# Patient Record
Sex: Male | Born: 1945 | Race: White | Hispanic: No | Marital: Married | Smoking: Never smoker
Health system: Southern US, Community
[De-identification: ages and names within clinical notes are randomized; demographics above are authoritative.]

## PROBLEM LIST (undated history)

## (undated) DIAGNOSIS — C4491 Basal cell carcinoma of skin, unspecified: Secondary | ICD-10-CM

## (undated) DIAGNOSIS — N200 Calculus of kidney: Secondary | ICD-10-CM

## (undated) DIAGNOSIS — N400 Enlarged prostate without lower urinary tract symptoms: Secondary | ICD-10-CM

## (undated) DIAGNOSIS — I2510 Atherosclerotic heart disease of native coronary artery without angina pectoris: Secondary | ICD-10-CM

## (undated) DIAGNOSIS — L0390 Cellulitis, unspecified: Secondary | ICD-10-CM

## (undated) DIAGNOSIS — Z9889 Other specified postprocedural states: Secondary | ICD-10-CM

## (undated) DIAGNOSIS — J329 Chronic sinusitis, unspecified: Secondary | ICD-10-CM

## (undated) DIAGNOSIS — C61 Malignant neoplasm of prostate: Secondary | ICD-10-CM

## (undated) DIAGNOSIS — E785 Hyperlipidemia, unspecified: Secondary | ICD-10-CM

## (undated) DIAGNOSIS — M25511 Pain in right shoulder: Secondary | ICD-10-CM

## (undated) DIAGNOSIS — M722 Plantar fascial fibromatosis: Secondary | ICD-10-CM

## (undated) DIAGNOSIS — D099 Carcinoma in situ, unspecified: Secondary | ICD-10-CM

## (undated) DIAGNOSIS — L309 Dermatitis, unspecified: Secondary | ICD-10-CM

## (undated) DIAGNOSIS — M4800 Spinal stenosis, site unspecified: Secondary | ICD-10-CM

## (undated) HISTORY — DX: Plantar fascial fibromatosis: M72.2

## (undated) HISTORY — PX: MRI: SHX5353

## (undated) HISTORY — DX: Basal cell carcinoma of skin, unspecified: C44.91

## (undated) HISTORY — DX: Carcinoma in situ, unspecified: D09.9

## (undated) HISTORY — DX: Other specified postprocedural states: Z98.890

## (undated) HISTORY — DX: Calculus of kidney: N20.0

## (undated) HISTORY — DX: Chronic sinusitis, unspecified: J32.9

## (undated) HISTORY — DX: Atherosclerotic heart disease of native coronary artery without angina pectoris: I25.10

## (undated) HISTORY — DX: Dermatitis, unspecified: L30.9

## (undated) HISTORY — DX: Cellulitis, unspecified: L03.90

## (undated) HISTORY — PX: LITHOTRIPSY: SUR834

## (undated) HISTORY — PX: KNEE SURGERY: SHX244

## (undated) HISTORY — DX: Malignant neoplasm of prostate: C61

## (undated) HISTORY — DX: Spinal stenosis, site unspecified: M48.00

## (undated) HISTORY — DX: Pain in right shoulder: M25.511

## (undated) HISTORY — DX: Hyperlipidemia, unspecified: E78.5

## (undated) HISTORY — PX: TONSILLECTOMY: SUR1361

## (undated) HISTORY — DX: Benign prostatic hyperplasia without lower urinary tract symptoms: N40.0

---

## 1968-06-14 HISTORY — PX: OTHER SURGICAL HISTORY: SHX169

## 1994-06-14 HISTORY — PX: COLONOSCOPY: SHX174

## 2009-02-21 ENCOUNTER — Ambulatory Visit: Payer: Self-pay | Admitting: Gastroenterology

## 2009-03-03 ENCOUNTER — Ambulatory Visit: Payer: Self-pay | Admitting: Gastroenterology

## 2009-09-11 ENCOUNTER — Encounter (INDEPENDENT_AMBULATORY_CARE_PROVIDER_SITE_OTHER): Payer: Self-pay | Admitting: Internal Medicine

## 2009-09-11 ENCOUNTER — Ambulatory Visit: Payer: Self-pay | Admitting: Surgery

## 2009-09-11 ENCOUNTER — Inpatient Hospital Stay (HOSPITAL_COMMUNITY): Admission: EM | Admit: 2009-09-11 | Discharge: 2009-09-11 | Payer: Self-pay | Admitting: Emergency Medicine

## 2010-02-20 ENCOUNTER — Ambulatory Visit (HOSPITAL_COMMUNITY): Admission: RE | Admit: 2010-02-20 | Discharge: 2010-02-20 | Payer: Self-pay | Admitting: Urology

## 2010-03-03 ENCOUNTER — Ambulatory Visit: Admission: RE | Admit: 2010-03-03 | Discharge: 2010-03-09 | Payer: Self-pay | Admitting: Radiation Oncology

## 2010-04-05 ENCOUNTER — Emergency Department (HOSPITAL_COMMUNITY): Admission: EM | Admit: 2010-04-05 | Discharge: 2010-04-05 | Payer: Self-pay | Admitting: Emergency Medicine

## 2010-06-14 DIAGNOSIS — C61 Malignant neoplasm of prostate: Secondary | ICD-10-CM

## 2010-06-14 HISTORY — DX: Malignant neoplasm of prostate: C61

## 2010-07-28 ENCOUNTER — Ambulatory Visit: Payer: Self-pay | Admitting: Radiation Oncology

## 2010-07-30 ENCOUNTER — Ambulatory Visit: Payer: Medicare Other | Attending: Radiation Oncology | Admitting: Radiation Oncology

## 2010-07-30 DIAGNOSIS — Z510 Encounter for antineoplastic radiation therapy: Secondary | ICD-10-CM | POA: Insufficient documentation

## 2010-07-30 DIAGNOSIS — C61 Malignant neoplasm of prostate: Secondary | ICD-10-CM | POA: Insufficient documentation

## 2010-08-06 ENCOUNTER — Other Ambulatory Visit (HOSPITAL_COMMUNITY): Payer: Self-pay | Admitting: Urology

## 2010-08-06 DIAGNOSIS — C61 Malignant neoplasm of prostate: Secondary | ICD-10-CM

## 2010-08-13 ENCOUNTER — Ambulatory Visit (HOSPITAL_COMMUNITY)
Admission: RE | Admit: 2010-08-13 | Discharge: 2010-08-13 | Disposition: A | Discharge status: Home / Self Care | Payer: Medicare Other | Source: Ambulatory Visit | Attending: Urology | Admitting: Urology

## 2010-08-13 DIAGNOSIS — C61 Malignant neoplasm of prostate: Secondary | ICD-10-CM | POA: Insufficient documentation

## 2010-08-13 MED ORDER — GADOBENATE DIMEGLUMINE 529 MG/ML IV SOLN
20.0000 mL | Freq: Once | INTRAVENOUS | Status: AC | PRN
  Administered 2010-08-13: 20 mL via INTRAVENOUS

## 2010-08-26 LAB — DIFFERENTIAL
Basophils Absolute: 0 10*3/uL (ref 0.0–0.1)
Lymphs Abs: 1.3 10*3/uL (ref 0.7–4.0)
Monocytes Relative: 6 % (ref 3–12)
Neutrophils Relative %: 76 % (ref 43–77)

## 2010-08-26 LAB — COMPREHENSIVE METABOLIC PANEL
ALT: 22 U/L (ref 0–53)
AST: 20 U/L (ref 0–37)
Albumin: 3.9 g/dL (ref 3.5–5.2)
Alkaline Phosphatase: 103 U/L (ref 39–117)
BUN: 15 mg/dL (ref 6–23)
GFR calc non Af Amer: >60 mL/min (ref >60)
Glucose, Bld: 120 mg/dL — ABNORMAL HIGH (ref 70–99)
Total Bilirubin: 0.5 mg/dL (ref 0.3–1.2)
Total Protein: 6.8 g/dL (ref 6.0–8.3)

## 2010-08-26 LAB — CBC
Hemoglobin: 14.4 g/dL (ref 13.0–17.0)
MCHC: 34.2 g/dL (ref 30.0–36.0)
Platelets: 256 10*3/uL (ref 150–400)
RDW: 14.5 % (ref 11.5–15.5)

## 2010-08-26 LAB — URINALYSIS, ROUTINE W REFLEX MICROSCOPIC
Bilirubin Urine: NEGATIVE
Nitrite: NEGATIVE
Specific Gravity, Urine: 1.022 (ref 1.005–1.030)
Urobilinogen, UA: 0.2 mg/dL (ref 0.0–1.0)

## 2010-09-07 LAB — CBC
HCT: 44.5 % (ref 39.0–52.0)
Hemoglobin: 15.1 g/dL (ref 13.0–17.0)
MCV: 87.2 fL (ref 78.0–100.0)
Platelets: 230 10*3/uL (ref 150–400)
RBC: 5.07 MIL/uL (ref 4.22–5.81)
RBC: 5.16 MIL/uL (ref 4.22–5.81)
RDW: 12.6 % (ref 11.5–15.5)
RDW: 12.9 % (ref 11.5–15.5)

## 2010-09-07 LAB — LIPID PANEL
Cholesterol: 235 mg/dL — ABNORMAL HIGH (ref 0–200)
HDL: 35 mg/dL — ABNORMAL LOW (ref >39)
LDL Cholesterol: 138 mg/dL — ABNORMAL HIGH (ref 0–99)
Triglycerides: 312 mg/dL — ABNORMAL HIGH (ref <150)

## 2010-09-07 LAB — COMPREHENSIVE METABOLIC PANEL
AST: 22 U/L (ref 0–37)
Albumin: 4.1 g/dL (ref 3.5–5.2)
BUN: 10 mg/dL (ref 6–23)
Calcium: 9 mg/dL (ref 8.4–10.5)
Chloride: 103 mEq/L (ref 96–112)
Creatinine, Ser: 0.99 mg/dL (ref 0.4–1.5)
GFR calc Af Amer: >60 mL/min (ref >60)
Total Protein: 6.9 g/dL (ref 6.0–8.3)

## 2010-09-07 LAB — URINALYSIS, ROUTINE W REFLEX MICROSCOPIC
Glucose, UA: NEGATIVE mg/dL
Hgb urine dipstick: NEGATIVE
Nitrite: NEGATIVE
Protein, ur: NEGATIVE mg/dL
Specific Gravity, Urine: 1.006 (ref 1.005–1.030)
pH: 7 (ref 5.0–8.0)

## 2010-09-07 LAB — POCT I-STAT, CHEM 8
Calcium, Ion: 1.17 mmol/L (ref 1.12–1.32)
Creatinine, Ser: 1 mg/dL (ref 0.4–1.5)
HCT: 49 % (ref 39.0–52.0)
Hemoglobin: 16.7 g/dL (ref 13.0–17.0)
Sodium: 140 mEq/L (ref 135–145)

## 2010-09-07 LAB — CARDIAC PANEL(CRET KIN+CKTOT+MB+TROPI)
CK, MB: 2.7 ng/mL (ref 0.3–4.0)
CK, MB: 2.9 ng/mL (ref 0.3–4.0)
Relative Index: 1.8 (ref 0.0–2.5)
Total CK: 160 U/L (ref 7–232)
Troponin I: <0.01 ng/mL (ref 0.00–0.06)

## 2010-09-07 LAB — POCT CARDIAC MARKERS: CKMB, poc: 1.8 ng/mL (ref 1.0–8.0)

## 2010-09-07 LAB — PROTIME-INR: Prothrombin Time: 12.6 seconds (ref 11.6–15.2)

## 2010-09-07 LAB — TSH: TSH: 6.132 u[IU]/mL — ABNORMAL HIGH (ref 0.350–4.500)

## 2010-09-07 LAB — CK TOTAL AND CKMB (NOT AT ARMC): Relative Index: 1.9 (ref 0.0–2.5)

## 2010-09-07 LAB — TROPONIN I: Troponin I: 0.01 ng/mL (ref 0.00–0.06)

## 2010-09-07 LAB — HEMOGLOBIN A1C: Hgb A1c MFr Bld: 6.4 % — ABNORMAL HIGH (ref 4.6–6.1)

## 2010-10-23 ENCOUNTER — Ambulatory Visit (HOSPITAL_BASED_OUTPATIENT_CLINIC_OR_DEPARTMENT_OTHER)
Admission: RE | Admit: 2010-10-23 | Discharge: 2010-10-23 | Disposition: A | Discharge status: Home / Self Care | Payer: Medicare Other | Source: Ambulatory Visit | Attending: Orthopedic Surgery | Admitting: Orthopedic Surgery

## 2010-10-23 DIAGNOSIS — M23329 Other meniscus derangements, posterior horn of medial meniscus, unspecified knee: Secondary | ICD-10-CM | POA: Insufficient documentation

## 2010-10-23 DIAGNOSIS — M9420 Chondromalacia, unspecified site: Secondary | ICD-10-CM | POA: Insufficient documentation

## 2010-10-26 LAB — POCT I-STAT 4, (NA,K, GLUC, HGB,HCT)
Hemoglobin: 14.6 g/dL (ref 13.0–17.0)
Potassium: 3.8 mEq/L (ref 3.5–5.1)
Sodium: 140 mEq/L (ref 135–145)

## 2010-10-28 ENCOUNTER — Ambulatory Visit
Admission: RE | Admit: 2010-10-28 | Discharge: 2010-10-28 | Disposition: A | Discharge status: Home / Self Care | Payer: Medicare Other | Source: Ambulatory Visit | Attending: Radiation Oncology | Admitting: Radiation Oncology

## 2010-10-28 DIAGNOSIS — C61 Malignant neoplasm of prostate: Secondary | ICD-10-CM | POA: Insufficient documentation

## 2010-10-28 DIAGNOSIS — Z510 Encounter for antineoplastic radiation therapy: Secondary | ICD-10-CM | POA: Insufficient documentation

## 2010-10-28 NOTE — Op Note (Signed)
  [name], Joseph Marsh           ACCOUNT NO.:  [id number]  MEDICAL RECORD NO.:  [id number]           PATIENT TYPE:  O  LOCATION:  [facility]                         FACILITY:  [facility]  PHYSICIAN:  Ollen Gross, M.D.    DATE OF BIRTH:  24-Jan-1946  DATE OF PROCEDURE:  10/23/2010 DATE OF DISCHARGE:  08/13/2010                              OPERATIVE REPORT   PREOPERATIVE DIAGNOSIS:  Left knee medial meniscal tear.  POSTOPERATIVE DIAGNOSIS:  Left knee medial meniscal tear, plus chondral defect.  PROCEDURE:  Left knee arthroscopy with meniscal debridement and chondroplasty.  SURGEON:  Ollen Gross, M.D.  ASSISTANT.:  None.  ANESTHESIA:  General.  ESTIMATED BLOOD LOSS:  Minimal.  DRAINS:  None.  COMPLICATIONS:  None.  CONDITION:  Stable to recovery room.  BRIEF CLINICAL NOTE:  [name] is a 65 year old male with several- month history significant left knee pain mechanical symptoms.  Exam and history suggested a medial meniscal tear confirmed by MRI.  He presents for arthroscopy and debridement.  PROCEDURE IN DETAIL:  After successful administration of general anesthetic, a tourniquet was placed high on the left thigh and left lower extremity prepped and draped in the usual sterile fashion. Standard superomedial inferolateral portal incisions were made.  Inflow cannula passed, superomedial camera passed inferolateral.  Arthroscopic visualization proceeds.  Undersurface of patella and trochlea showed some grade 2 changes.  There were no full-thickness chondral defects or any unstable cartilage.  Medial and lateral gutters were visualized, there were no loose bodies.  Flexion valgus force applied to the knee and the medial compartment centered.  He has got a real significant tear in the body and posterior horn of the medial meniscus.  A spinal needle was used to localize inferomedial portal.  Small incision made and dilator placed.  The meniscus was then debrided back to  stable base with baskets and a 4.2-mm shaver and sealed off with the ArthroCare device. This was probed and found to be stable.  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  There was some grade 3 change at about 1 x 2 cm area.  She was debrided back to stable cartilaginous base with stable edges.  There was not any exposed bone.  The rest of the medial compartment is unaffected.  The intercondylar notch was visualized.  ACL was normal.  Lateral compartment was entered and it looks normal.  The joint was again inspected.  No other tears, defects or loose bodies were noted.  Arthroscopic equipments were removed from the inferior portals which were closed with interrupted 4-0 nylon.  A 20 cc of 0.25% Marcaine with epi injected through the inflow cannula and that was removed and that portal closed with nylon.  Incision was then cleaned and dried, and bulky sterile dressing applied.  He was then awakened and transported to recovery in stable condition.     Ollen Gross, M.D.     FA/MEDQ  D:  10/23/2010  T:  10/23/2010  Job:  [id number]  Electronically Signed by Ollen Gross M.D. on 10/28/2010 10:12:44 AM

## 2010-11-03 ENCOUNTER — Ambulatory Visit (HOSPITAL_COMMUNITY)
Admission: RE | Admit: 2010-11-03 | Discharge: 2010-11-03 | Disposition: A | Discharge status: Home / Self Care | Payer: Medicare Other | Source: Ambulatory Visit | Attending: Orthopedic Surgery | Admitting: Orthopedic Surgery

## 2010-11-03 DIAGNOSIS — M79609 Pain in unspecified limb: Secondary | ICD-10-CM | POA: Insufficient documentation

## 2010-11-03 DIAGNOSIS — M7989 Other specified soft tissue disorders: Secondary | ICD-10-CM | POA: Insufficient documentation

## 2010-11-10 ENCOUNTER — Other Ambulatory Visit: Payer: Self-pay | Admitting: Urology

## 2010-11-10 ENCOUNTER — Ambulatory Visit
Admission: RE | Admit: 2010-11-10 | Discharge: 2010-11-10 | Disposition: A | Discharge status: Home / Self Care | Payer: Medicare Other | Source: Ambulatory Visit | Attending: Urology | Admitting: Urology

## 2010-11-10 DIAGNOSIS — Z01811 Encounter for preprocedural respiratory examination: Secondary | ICD-10-CM

## 2010-11-10 DIAGNOSIS — C61 Malignant neoplasm of prostate: Secondary | ICD-10-CM

## 2010-11-11 LAB — COMPREHENSIVE METABOLIC PANEL
ALT: 34 U/L (ref 0–53)
Alkaline Phosphatase: 141 U/L — ABNORMAL HIGH (ref 39–117)
BUN: 18 mg/dL (ref 6–23)
CO2: 29 mEq/L (ref 19–32)
Calcium: 9.9 mg/dL (ref 8.4–10.5)
GFR calc non Af Amer: >60 mL/min (ref >60)
Glucose, Bld: 94 mg/dL (ref 70–99)
Sodium: 141 mEq/L (ref 135–145)
Total Protein: 7.3 g/dL (ref 6.0–8.3)

## 2010-11-11 LAB — CBC
HCT: 42 % (ref 39.0–52.0)
Hemoglobin: 13.5 g/dL (ref 13.0–17.0)
MCHC: 32.1 g/dL (ref 30.0–36.0)
MCV: 85.2 fL (ref 78.0–100.0)
RDW: 12.8 % (ref 11.5–15.5)

## 2010-11-11 LAB — PROTIME-INR
INR: 0.95 (ref 0.00–1.49)
Prothrombin Time: 12.9 seconds (ref 11.6–15.2)

## 2010-11-16 ENCOUNTER — Ambulatory Visit (HOSPITAL_BASED_OUTPATIENT_CLINIC_OR_DEPARTMENT_OTHER)
Admission: RE | Admit: 2010-11-16 | Discharge: 2010-11-16 | Disposition: A | Discharge status: Home / Self Care | Payer: Medicare Other | Source: Ambulatory Visit | Attending: Urology | Admitting: Urology

## 2010-11-16 ENCOUNTER — Ambulatory Visit (HOSPITAL_COMMUNITY): Payer: Medicare Other

## 2010-11-16 DIAGNOSIS — Z8673 Personal history of transient ischemic attack (TIA), and cerebral infarction without residual deficits: Secondary | ICD-10-CM | POA: Insufficient documentation

## 2010-11-16 DIAGNOSIS — Z79899 Other long term (current) drug therapy: Secondary | ICD-10-CM | POA: Insufficient documentation

## 2010-11-16 DIAGNOSIS — Z7982 Long term (current) use of aspirin: Secondary | ICD-10-CM | POA: Insufficient documentation

## 2010-11-16 DIAGNOSIS — Z96659 Presence of unspecified artificial knee joint: Secondary | ICD-10-CM | POA: Insufficient documentation

## 2010-11-16 DIAGNOSIS — C61 Malignant neoplasm of prostate: Secondary | ICD-10-CM | POA: Insufficient documentation

## 2010-11-16 DIAGNOSIS — E119 Type 2 diabetes mellitus without complications: Secondary | ICD-10-CM | POA: Insufficient documentation

## 2010-11-16 DIAGNOSIS — Z01812 Encounter for preprocedural laboratory examination: Secondary | ICD-10-CM | POA: Insufficient documentation

## 2011-02-07 ENCOUNTER — Emergency Department (HOSPITAL_COMMUNITY): Payer: Medicare Other

## 2011-02-07 ENCOUNTER — Emergency Department (HOSPITAL_COMMUNITY)
Admission: EM | Admit: 2011-02-07 | Discharge: 2011-02-07 | Disposition: A | Discharge status: Home / Self Care | Payer: Medicare Other | Attending: Emergency Medicine | Admitting: Emergency Medicine

## 2011-02-07 DIAGNOSIS — L02519 Cutaneous abscess of unspecified hand: Secondary | ICD-10-CM | POA: Insufficient documentation

## 2011-02-07 DIAGNOSIS — Y93H2 Activity, gardening and landscaping: Secondary | ICD-10-CM | POA: Insufficient documentation

## 2011-02-07 DIAGNOSIS — Z8546 Personal history of malignant neoplasm of prostate: Secondary | ICD-10-CM | POA: Insufficient documentation

## 2011-02-07 DIAGNOSIS — W208XXA Other cause of strike by thrown, projected or falling object, initial encounter: Secondary | ICD-10-CM | POA: Insufficient documentation

## 2011-02-07 DIAGNOSIS — IMO0002 Reserved for concepts with insufficient information to code with codable children: Secondary | ICD-10-CM | POA: Insufficient documentation

## 2011-06-14 ENCOUNTER — Encounter: Payer: Self-pay | Admitting: *Deleted

## 2011-06-14 NOTE — Progress Notes (Signed)
09/03/2010 I-PSS Results=[id number] and 2 Score=2.5

## 2011-11-19 DIAGNOSIS — C61 Malignant neoplasm of prostate: Secondary | ICD-10-CM | POA: Diagnosis not present

## 2011-11-26 DIAGNOSIS — C61 Malignant neoplasm of prostate: Secondary | ICD-10-CM | POA: Diagnosis not present

## 2011-11-26 DIAGNOSIS — N529 Male erectile dysfunction, unspecified: Secondary | ICD-10-CM | POA: Diagnosis not present

## 2011-11-26 DIAGNOSIS — N400 Enlarged prostate without lower urinary tract symptoms: Secondary | ICD-10-CM | POA: Diagnosis not present

## 2012-03-28 ENCOUNTER — Encounter: Payer: Self-pay | Admitting: *Deleted

## 2012-03-29 ENCOUNTER — Encounter: Payer: Medicare Other | Admitting: *Deleted

## 2012-03-30 ENCOUNTER — Other Ambulatory Visit: Payer: Self-pay | Admitting: *Deleted

## 2012-04-05 ENCOUNTER — Encounter: Payer: Medicare Other | Admitting: *Deleted

## 2012-04-05 ENCOUNTER — Encounter: Payer: Self-pay | Admitting: *Deleted

## 2012-04-05 DIAGNOSIS — C61 Malignant neoplasm of prostate: Secondary | ICD-10-CM

## 2012-04-05 MED ORDER — ARGINMAX/PLACEBO #168 CCCWFU 98110: ORAL_CAPSULE | ORAL | Status: AC

## 2012-04-05 NOTE — Progress Notes (Signed)
04/05/12 at 9:43am - Research [facility] [address] Day 1 study notes- The pt was into the cancer center this am to pick up his study drug and his study packets for week 4 and week 8 time points.  The research nurse reminded the pt that he should not take any new herbal supplements for ED or any investigational products while participating in this 8 week study.  The nurse reviewed the pt's current medications, and there has been no change in his medications since baseline.  The research nurse went over in detail about the study drug administration.  The pt was shown that there were 2 bottles (Bottle A and Bottle B) for weeks 1-4 and 2 bottles (Bottle A and Bottle B) for weeks 5-8.  The pt was instructed to take 3 capsules from Bottle A and 3 capsules from Bottle B in the morning for a total of 6 capsules in the am.  He was shown how to record his am dose on the medication calendar.  He was further told to repeat this process for his evening dose and record the 6 capsules in the pm slot on the medication calendar.  He verbalized that he is take 12 capsules daily.  The pt then demonstrated his study drug administration by taking his am capsules in the clinic and recording his am medication on the calendar.  The nurse then explained the Sexual Encounter Profile forms (SEP).  The research nurse instructed the pt to attempt sexual activity weekly and record his response on the forms.  The pt was given extra SEP forms to complete after every sexual attempt while on the 8 week study.  The pt was told that the research nurse will call the pt to remind him to complete his week 4 and 8 questionnaires.  The pt was given a self-addressed envelope to return his completed week 4 assessments.  The pt was also given a UPS envelope, and he was told to return all of his study drug bottles (4 Bottles) and his 8 weeks assessments at the end of the study.  The nurse specifically explained that all bottles even empty bottles need to be returned  to our site for the final drug accountability check.  The pt was told that there is an emergency unblinding procedure for study drug if there was a need to determine which drug he was assigned to at registration.  The pt was told that he will be informed of his study drug assignment at the study conclusion if he completes his required assessments and returns all of his study drug bottles.  Rn also informed the pt to contact Dr. Rennie Plowman nurse or myself if he develops any problems while on-study.  The pt was thanked for his support and participation in this study.

## 2012-04-10 ENCOUNTER — Telehealth: Payer: Self-pay | Admitting: Gastroenterology

## 2012-04-10 ENCOUNTER — Encounter: Payer: Self-pay | Admitting: *Deleted

## 2012-04-10 NOTE — Telephone Encounter (Signed)
Pt with normal COLON by Dr Jarold Motto on 03/03/2009 and RECALL in 10 years. Pt reports rectal bleeding on and off for the past few weeks. Saturday, the bleeding was worse; all bleeding is BR and on the tissue only, not in the bowl. Pt has a hx of Prostate Cancer with radiation between 2/12 to 6/12 and seed implants in July 2012. Pt given an appt with Dr Jarold Motto on 04/12/12.

## 2012-04-12 ENCOUNTER — Other Ambulatory Visit (INDEPENDENT_AMBULATORY_CARE_PROVIDER_SITE_OTHER): Payer: Medicare Other

## 2012-04-12 ENCOUNTER — Ambulatory Visit (INDEPENDENT_AMBULATORY_CARE_PROVIDER_SITE_OTHER): Payer: Medicare Other | Admitting: Gastroenterology

## 2012-04-12 ENCOUNTER — Encounter: Payer: Self-pay | Admitting: Gastroenterology

## 2012-04-12 VITALS — BP 118/70 | HR 71 | Ht 70.0 in | Wt 175.4 lb

## 2012-04-12 DIAGNOSIS — R52 Pain, unspecified: Secondary | ICD-10-CM | POA: Diagnosis not present

## 2012-04-12 DIAGNOSIS — Z8546 Personal history of malignant neoplasm of prostate: Secondary | ICD-10-CM | POA: Diagnosis not present

## 2012-04-12 DIAGNOSIS — K648 Other hemorrhoids: Secondary | ICD-10-CM | POA: Diagnosis not present

## 2012-04-12 DIAGNOSIS — R6889 Other general symptoms and signs: Secondary | ICD-10-CM | POA: Diagnosis not present

## 2012-04-12 DIAGNOSIS — K625 Hemorrhage of anus and rectum: Secondary | ICD-10-CM | POA: Diagnosis not present

## 2012-04-12 DIAGNOSIS — Z5189 Encounter for other specified aftercare: Secondary | ICD-10-CM

## 2012-04-12 DIAGNOSIS — Z923 Personal history of irradiation: Secondary | ICD-10-CM

## 2012-04-12 LAB — CBC WITH DIFFERENTIAL/PLATELET
Basophils Relative: 0.6 % (ref 0.0–3.0)
Eosinophils Relative: 1.4 % (ref 0.0–5.0)
HCT: 44.5 % (ref 39.0–52.0)
Hemoglobin: 14.7 g/dL (ref 13.0–17.0)
Lymphocytes Relative: 23.5 % (ref 12.0–46.0)
Lymphs Abs: 1 10*3/uL (ref 0.7–4.0)
Monocytes Relative: 7.2 % (ref 3.0–12.0)
Neutro Abs: 2.8 10*3/uL (ref 1.4–7.7)
RBC: 5.07 Mil/uL (ref 4.22–5.81)

## 2012-04-12 LAB — IBC PANEL: Iron: 88 ug/dL (ref 42–165)

## 2012-04-12 LAB — FOLATE: Folate: >24.8 ng/mL (ref >5.9)

## 2012-04-12 LAB — VITAMIN B12: Vitamin B-12: 419 pg/mL (ref 211–911)

## 2012-04-12 MED ORDER — PEG-KCL-NACL-NASULF-NA ASC-C 100 G PO SOLR: 1.0000 | Freq: Once | ORAL | Status: DC

## 2012-04-12 MED ORDER — MESALAMINE 1000 MG RE SUPP: 1000.0000 mg | Freq: Every day | RECTAL | Status: DC

## 2012-04-12 NOTE — Patient Instructions (Addendum)
Your physician has requested that you go to the basement for the following lab work before leaving today: CBC, Anemia panel.  You have been scheduled for a colonoscopy with propofol. Please follow written instructions given to you at your visit today.  Please pick up your prep kit at the pharmacy within the next 1-3 days. If you use inhalers (even only as needed) or a CPAP machine, please bring them with you on the day of your procedure.  We have sent the following medications to your pharmacy for you to pick up at your convenience: Canasa suppositories and samples have been given for to use rectally at bedtime.

## 2012-04-12 NOTE — Progress Notes (Signed)
History of Present Illness:  This is a very pleasant 66 year old Caucasian male who has had asymptomatic rectal bleeding for the last week. The patient over the last one half years has had external beam radiation and also seed implantation for prostate cancer directed by Dr. Dayton Scrape. He had no problems during his radiation therapy with diarrhea, rectal bleeding or rectal pain. His asymptomatic rectal bleeding has only occurred over the last one to 2 weeks, he has not had blood clots, but does have some more frequent soft stools than usual. He also has a sensation of incomplete rectal emptying, but no real abdominal pain, upper GI, or hepatobiliary problems. His appetite is good and his weight is stable. He is not on anti-inflammatories, but was on an aspirin tablet that he discontinued. He also is recently started a clinical trial of a dietary supplement for erectile dysfunction.  The patient did have screening colonoscopy 3 years ago which was unremarkable except for a long and redundant colon. Family history is noncontributory.  I have reviewed this patient's present history, medical and surgical past history, allergies and medications.     ROS: The remainder of the 10 point ROS is negative     Physical Exam: Healthy-appearing patient in no distress. Blood pressure 118/70, pulse 71 and regular, and weight 175 with BMI of 25.17. Oxygen saturation is 97%. General well developed well nourished patient in no acute distress, appearing their stated age Eyes PERRLA, no icterus, fundoscopic exam per opthamologist Skin no lesions noted Neck supple, no adenopathy, no thyroid enlargement, no tenderness Chest clear to percussion and auscultation Heart no significant murmurs, gallops or rubs noted Abdomen no hepatosplenomegaly masses or tenderness, BS normal.  Rectal inspection normal no fissures, or fistulae noted.  No masses or tenderness on digital exam. Stool guaiac negative. Extremities no acute joint  lesions, edema, phlebitis or evidence of cellulitis. Neurologic patient oriented x 3, cranial nerves intact, no focal neurologic deficits noted. Anoscopy: Anoscopic exam was completed without difficulty. He appears to have a posterior medial and a posterior lateral internal hemorrhoid which had stigmata of recent bleeding. I cannot appreciate any definite rectal telangiectasias, fissures or fistulae.  Psychological mental status normal and normal affect.  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 I have placed him on Canasa 1 g suppositories at bedtime pending colonoscopy exam. Also CBC and anemia profile ordered. He will continue to hold his aspirin until his colonoscopy has been completed. I have asked him to check with his clinical study physicians about his investigational  Arginmax/placebo clinical trial. Apparently this medication does have some bleeding side effects. This patient otherwise appears to be in good health with minimal systemic medical problems.  Encounter Diagnoses  Name Primary?  . Rectal bleeding Yes  . A vague feeling of discomfort    . Other general symptoms

## 2012-04-14 ENCOUNTER — Encounter: Payer: Self-pay | Admitting: Gastroenterology

## 2012-04-14 ENCOUNTER — Ambulatory Visit (AMBULATORY_SURGERY_CENTER): Payer: Medicare Other | Admitting: Gastroenterology

## 2012-04-14 VITALS — BP 113/72 | HR 52 | Temp 98.1°F | Resp 21 | Ht 70.0 in | Wt 175.0 lb

## 2012-04-14 DIAGNOSIS — K6289 Other specified diseases of anus and rectum: Secondary | ICD-10-CM | POA: Diagnosis not present

## 2012-04-14 DIAGNOSIS — K625 Hemorrhage of anus and rectum: Secondary | ICD-10-CM

## 2012-04-14 DIAGNOSIS — K627 Radiation proctitis: Secondary | ICD-10-CM

## 2012-04-14 MED ORDER — SODIUM CHLORIDE 0.9 % IV SOLN: 500.0000 mL | INTRAVENOUS | Status: DC

## 2012-04-14 NOTE — Progress Notes (Signed)
Patient did not experience any of the following events: a burn prior to discharge; a fall within the facility; wrong site/side/patient/procedure/implant event; or a hospital transfer or hospital admission upon discharge from the facility. (G8907) Patient did not have preoperative order for IV antibiotic SSI prophylaxis. (G8918)  

## 2012-04-14 NOTE — Patient Instructions (Addendum)

## 2012-04-14 NOTE — Op Note (Signed)
[facility] [address] [place], [address]   COLONOSCOPY PROCEDURE REPORT  PATIENT: Joseph Marsh, Joseph Marsh  MR#: #[id number] BIRTHDATE: December 30, 1945 , 66  yrs. old GENDER: Male ENDOSCOPIST: Mardella Layman, MD, [facility] REFERRED BY: PROCEDURE DATE:  04/14/2012 PROCEDURE:   Colonoscopy, diagnostic ASA CLASS:   Class II INDICATIONS:hematochezia. MEDICATIONS: Propofol (Diprivan) 140 mg IV  DESCRIPTION OF PROCEDURE:   After the risks and benefits and of the procedure were explained, informed consent was obtained.  A digital rectal exam revealed no abnormalities of the rectum.    The LB CF-[id number]  endoscope was introduced through the anus and advanced to the cecum, which was identified by both the appendix and ileocecal valve .  The quality of the prep was excellent, using MoviPrep .  The instrument was then slowly withdrawn as the colon was fully examined.     COLON FINDINGS: A normal appearing cecum, ileocecal valve, and appendiceal orifice were identified.  The ascending, hepatic flexure, transverse, splenic flexure, descending, sigmoid colon and rectum appeared unremarkable.  No polyps or cancers were seen.   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     The scope was then withdrawn from the patient and the procedure completed.  COMPLICATIONS: There were no complications. ENDOSCOPIC IMPRESSION: 1.   Normal colon,no polyps or cancer 2.   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   REPEAT EXAM:  cc:  _______________________________ eSignedMardella Layman, MD, [facility] 04/14/2012 3:41 PM

## 2012-04-14 NOTE — Progress Notes (Signed)
1539 - To recovery awake, VSS. Report to [place], Charity fundraiser

## 2012-04-17 ENCOUNTER — Telehealth: Payer: Self-pay | Admitting: *Deleted

## 2012-04-17 NOTE — Telephone Encounter (Signed)
Left message on number given in admitting on Friday as directed per pt. ewm

## 2012-05-03 ENCOUNTER — Encounter: Payer: Self-pay | Admitting: *Deleted

## 2012-05-03 NOTE — Progress Notes (Signed)
05/03/12 at 9:52am - [facility] [address] study notes for week 4 assessments - The pt was contacted by his research nurse for his week 4 assessments this morning.  The pt denies any problems and any side effects during the past 4 weeks.  He said that he has not "seen much change" in his sexual health over the past 4 weeks.  The pt denies any new medications and any changes to his baseline medications.  The pt verified that he is still taking the following medications/supplements:  glucosamine chondroitin, multivitamin, ibuprofen, and Tylenol PM.  He denies any difficulty filling out the medication diaries.  He also specifically denies the following adverse events:  dyspepsia, headache, nausea, and vomiting.  The pt was reminded that all of his week 4 questionnaires are due to be completed and returned to the research nurse in a timely manner.  The pt verbalized understanding, and he said that he will complete the questionnaires "over the weekend".  The pt confirmed that he will continue taking his study drug for weeks 5-8.  The pt was thanked for his continued support of this clinical trial.    05/17/12 at 11:26am - The research nurse has not received the pt's week 4 packet in the mail yet.  The nurse called the pt and he stated that he has not completed all of the questionnaires yet.  He said that he went out of town for Thanksgiving and has been very busy.  The nurse encouraged the pt to complete his questionnaires and mail the forms as soon as possible.  He said that he would definitely complete the forms this weekend.  The pt confirmed that he is still taking his study drug daily as prescribed and completing his medication diaries daily.  The nurse thanked the pt for his continued support of the study.  The research nurse will await his week 4 packet in the mail next week.    05/24/12 at 8:40am - The research nurse finally received this pt's week 4 packet containing his medication diaries and questionnaires. Rn will  forward this data to [facility] data management today.

## 2012-06-01 ENCOUNTER — Encounter: Payer: Self-pay | Admitting: *Deleted

## 2012-06-01 NOTE — Progress Notes (Signed)
06/01/12 at 1:12pm [facility] 98110 week 8 study notes - The nurse attempted to reach the pt yesterday at his work phone.  Rn was uanvailable to speak to the pt.  The research nurse eventually left a voice mail for the pt asking him to complete his week 8 questionnaires and return all of his study drug bottles.  The pt was also told to call the research nurse back to answer some study questions.  The pt never called the nurse back.  Therefore, the pt called the pt's home to reach him.  The pt was not available at home.  The research nurse will continue to try to reach the pt by phone to conduct his week 8 assessments.    06/02/12 at 9:17am - The finally reached the pt at home.  He said that he has been very busy at work and due to the holidays.  He said that he is going out of town tomorrow.  He said that he would try to complete the week 8 questionnaires "sometime next week".  The research nurse explained that he also needs to send back all 4 bottles of study drug along with his questionnaires and forms in the study provided UPS envelope.  He verbalized understanding.  The pt denies any side effects/problems taking his study drug (ArginMax/Placebo).  He specifically denies the following AE's:  dyspepsia, headache, nausea, and vomiting.   He also denies any changes in his medications.  He denies any difficulty filling out his medication diary.  The pt was thanked for his participation in the study.  He was also informed that once our site is notified by [facility] regarding the unblinding of study treatment, then he will be informed if he was taking ArginMax or placebo.  The nurse stressed that all of his questionnaires and forms must be received before this information can be shared with the participant.  The research nurse will await the return of his week 8 study questionnaires and study drug bottles.    06/19/12 at 10:40am - The research nurse received the pt's UPS envelope in the mail today.  The pt wrote the nurse a  letter stating that he forgot and threw away his first 2 bottles for weeks 1-4.  The pt did return his weeks 5-8 bottles A and B with no study drug in either bottle.  He also returned his completed 4 weekly medication diaries, the global efficacy questionnaire, the SEP forms, the 3 study questionnaires (all completed on 06/14/12).  The nurse took the 2 study drug bottles to the pharmacy for the final drug accountability check.  The research nurse will also forward the pt's week 8 assessments to [facility] data management.

## 2012-06-10 DIAGNOSIS — J019 Acute sinusitis, unspecified: Secondary | ICD-10-CM | POA: Diagnosis not present

## 2012-06-12 DIAGNOSIS — C61 Malignant neoplasm of prostate: Secondary | ICD-10-CM | POA: Diagnosis not present

## 2012-06-16 DIAGNOSIS — C61 Malignant neoplasm of prostate: Secondary | ICD-10-CM | POA: Diagnosis not present

## 2012-06-16 DIAGNOSIS — N400 Enlarged prostate without lower urinary tract symptoms: Secondary | ICD-10-CM | POA: Diagnosis not present

## 2012-06-16 DIAGNOSIS — N529 Male erectile dysfunction, unspecified: Secondary | ICD-10-CM | POA: Diagnosis not present

## 2012-08-04 DIAGNOSIS — J019 Acute sinusitis, unspecified: Secondary | ICD-10-CM | POA: Diagnosis not present

## 2012-08-11 DIAGNOSIS — H35379 Puckering of macula, unspecified eye: Secondary | ICD-10-CM | POA: Diagnosis not present

## 2012-08-11 DIAGNOSIS — H2510 Age-related nuclear cataract, unspecified eye: Secondary | ICD-10-CM | POA: Diagnosis not present

## 2012-08-11 DIAGNOSIS — H524 Presbyopia: Secondary | ICD-10-CM | POA: Diagnosis not present

## 2012-08-11 DIAGNOSIS — H538 Other visual disturbances: Secondary | ICD-10-CM | POA: Diagnosis not present

## 2012-08-11 DIAGNOSIS — H40019 Open angle with borderline findings, low risk, unspecified eye: Secondary | ICD-10-CM | POA: Diagnosis not present

## 2012-08-26 DIAGNOSIS — R05 Cough: Secondary | ICD-10-CM | POA: Diagnosis not present

## 2012-08-30 DIAGNOSIS — C61 Malignant neoplasm of prostate: Secondary | ICD-10-CM | POA: Diagnosis not present

## 2012-08-30 DIAGNOSIS — R310 Gross hematuria: Secondary | ICD-10-CM | POA: Diagnosis not present

## 2012-09-01 DIAGNOSIS — N200 Calculus of kidney: Secondary | ICD-10-CM | POA: Diagnosis not present

## 2012-09-01 DIAGNOSIS — C61 Malignant neoplasm of prostate: Secondary | ICD-10-CM | POA: Diagnosis not present

## 2012-09-01 DIAGNOSIS — R310 Gross hematuria: Secondary | ICD-10-CM | POA: Diagnosis not present

## 2012-09-20 DIAGNOSIS — R310 Gross hematuria: Secondary | ICD-10-CM | POA: Diagnosis not present

## 2012-11-08 DIAGNOSIS — H40019 Open angle with borderline findings, low risk, unspecified eye: Secondary | ICD-10-CM | POA: Diagnosis not present

## 2012-12-18 DIAGNOSIS — C61 Malignant neoplasm of prostate: Secondary | ICD-10-CM | POA: Diagnosis not present

## 2012-12-18 DIAGNOSIS — N400 Enlarged prostate without lower urinary tract symptoms: Secondary | ICD-10-CM | POA: Diagnosis not present

## 2012-12-22 DIAGNOSIS — N400 Enlarged prostate without lower urinary tract symptoms: Secondary | ICD-10-CM | POA: Diagnosis not present

## 2012-12-22 DIAGNOSIS — R310 Gross hematuria: Secondary | ICD-10-CM | POA: Diagnosis not present

## 2012-12-22 DIAGNOSIS — C61 Malignant neoplasm of prostate: Secondary | ICD-10-CM | POA: Diagnosis not present

## 2012-12-22 DIAGNOSIS — N529 Male erectile dysfunction, unspecified: Secondary | ICD-10-CM | POA: Diagnosis not present

## 2013-04-16 DIAGNOSIS — Z8546 Personal history of malignant neoplasm of prostate: Secondary | ICD-10-CM | POA: Diagnosis not present

## 2013-04-16 DIAGNOSIS — Z8673 Personal history of transient ischemic attack (TIA), and cerebral infarction without residual deficits: Secondary | ICD-10-CM | POA: Diagnosis not present

## 2013-04-16 DIAGNOSIS — R209 Unspecified disturbances of skin sensation: Secondary | ICD-10-CM | POA: Diagnosis not present

## 2013-04-16 DIAGNOSIS — K625 Hemorrhage of anus and rectum: Secondary | ICD-10-CM | POA: Diagnosis not present

## 2013-04-16 DIAGNOSIS — R7309 Other abnormal glucose: Secondary | ICD-10-CM | POA: Diagnosis not present

## 2013-04-16 DIAGNOSIS — Z23 Encounter for immunization: Secondary | ICD-10-CM | POA: Diagnosis not present

## 2013-04-16 DIAGNOSIS — Z0000 Encounter for general adult medical examination without abnormal findings: Secondary | ICD-10-CM | POA: Diagnosis not present

## 2013-04-16 DIAGNOSIS — E7800 Pure hypercholesterolemia, unspecified: Secondary | ICD-10-CM | POA: Diagnosis not present

## 2013-04-26 ENCOUNTER — Ambulatory Visit (INDEPENDENT_AMBULATORY_CARE_PROVIDER_SITE_OTHER): Payer: Medicare Other

## 2013-04-26 DIAGNOSIS — R209 Unspecified disturbances of skin sensation: Secondary | ICD-10-CM

## 2013-04-26 NOTE — Procedures (Signed)
     HISTORY:  Joseph Marsh is a 67 year old patient with a history of prediabetes and a one-year history of some soreness and numbness in the feet at rest. The patient is being evaluated for a possible peripheral neuropathy.  NERVE CONDUCTION STUDIES:  Nerve conduction studies were performed on both lower extremities. The distal motor latencies and motor amplitudes for the peroneal and posterior tibial nerves were within normal limits. The nerve conduction velocities for these nerves were also normal. The H reflex latencies were normal. The sensory latencies for the peroneal nerves were within normal limits. The medial and lateral plantar sensory latencies were symmetric and within normal limits bilaterally.    EMG STUDIES:  EMG evaluation was not performed.  IMPRESSION:  Nerve conduction studies done on both lower extremities were unremarkable. There is no clear evidence of a peripheral neuropathy or evidence of a tarsal tunnel syndrome on either side. A small fiber neuropathy may be missed by a standard nerve conduction studies, however. Clinical correlation is required.  Marlan Palau MD 04/26/2013 5:10 PM  [facility] [address] [place], [place] [address]  Phone [phone] Fax [phone]

## 2013-05-09 DIAGNOSIS — R209 Unspecified disturbances of skin sensation: Secondary | ICD-10-CM | POA: Diagnosis not present

## 2013-05-21 ENCOUNTER — Emergency Department (HOSPITAL_COMMUNITY): Payer: Medicare Other

## 2013-05-21 ENCOUNTER — Encounter (HOSPITAL_COMMUNITY): Payer: Self-pay | Admitting: Emergency Medicine

## 2013-05-21 ENCOUNTER — Emergency Department (HOSPITAL_COMMUNITY)
Admission: EM | Admit: 2013-05-21 | Discharge: 2013-05-21 | Disposition: A | Discharge status: Home / Self Care | Payer: Medicare Other | Source: Home / Self Care | Attending: Emergency Medicine | Admitting: Emergency Medicine

## 2013-05-21 DIAGNOSIS — R1013 Epigastric pain: Secondary | ICD-10-CM | POA: Diagnosis not present

## 2013-05-21 DIAGNOSIS — Z808 Family history of malignant neoplasm of other organs or systems: Secondary | ICD-10-CM | POA: Diagnosis not present

## 2013-05-21 DIAGNOSIS — R109 Unspecified abdominal pain: Secondary | ICD-10-CM

## 2013-05-21 DIAGNOSIS — R079 Chest pain, unspecified: Secondary | ICD-10-CM | POA: Diagnosis not present

## 2013-05-21 DIAGNOSIS — K6289 Other specified diseases of anus and rectum: Secondary | ICD-10-CM | POA: Diagnosis present

## 2013-05-21 DIAGNOSIS — C61 Malignant neoplasm of prostate: Secondary | ICD-10-CM | POA: Diagnosis not present

## 2013-05-21 DIAGNOSIS — Z87442 Personal history of urinary calculi: Secondary | ICD-10-CM | POA: Insufficient documentation

## 2013-05-21 DIAGNOSIS — K810 Acute cholecystitis: Secondary | ICD-10-CM | POA: Diagnosis not present

## 2013-05-21 DIAGNOSIS — Z79899 Other long term (current) drug therapy: Secondary | ICD-10-CM | POA: Insufficient documentation

## 2013-05-21 DIAGNOSIS — Z923 Personal history of irradiation: Secondary | ICD-10-CM | POA: Diagnosis not present

## 2013-05-21 DIAGNOSIS — K922 Gastrointestinal hemorrhage, unspecified: Secondary | ICD-10-CM | POA: Diagnosis not present

## 2013-05-21 DIAGNOSIS — Z8673 Personal history of transient ischemic attack (TIA), and cerebral infarction without residual deficits: Secondary | ICD-10-CM | POA: Diagnosis not present

## 2013-05-21 DIAGNOSIS — R1011 Right upper quadrant pain: Secondary | ICD-10-CM | POA: Diagnosis not present

## 2013-05-21 DIAGNOSIS — Z8249 Family history of ischemic heart disease and other diseases of the circulatory system: Secondary | ICD-10-CM | POA: Diagnosis not present

## 2013-05-21 DIAGNOSIS — R112 Nausea with vomiting, unspecified: Secondary | ICD-10-CM | POA: Insufficient documentation

## 2013-05-21 DIAGNOSIS — Z8546 Personal history of malignant neoplasm of prostate: Secondary | ICD-10-CM | POA: Insufficient documentation

## 2013-05-21 DIAGNOSIS — K8000 Calculus of gallbladder with acute cholecystitis without obstruction: Secondary | ICD-10-CM | POA: Diagnosis not present

## 2013-05-21 LAB — CBC WITH DIFFERENTIAL/PLATELET
Eosinophils Absolute: 0 10*3/uL (ref 0.0–0.7)
Eosinophils Relative: 0 % (ref 0–5)
HCT: 42.6 % (ref 39.0–52.0)
Hemoglobin: 14.6 g/dL (ref 13.0–17.0)
Lymphs Abs: 0.6 10*3/uL — ABNORMAL LOW (ref 0.7–4.0)
MCH: 29.7 pg (ref 26.0–34.0)
MCHC: 34.3 g/dL (ref 30.0–36.0)
MCV: 86.8 fL (ref 78.0–100.0)
Monocytes Relative: 3 % (ref 3–12)
Platelets: 210 10*3/uL (ref 150–400)
RBC: 4.91 MIL/uL (ref 4.22–5.81)

## 2013-05-21 LAB — POCT I-STAT TROPONIN I
Troponin i, poc: 0.01 ng/mL (ref 0.00–0.08)
Troponin i, poc: 0 ng/mL (ref 0.00–0.08)

## 2013-05-21 LAB — URINALYSIS, ROUTINE W REFLEX MICROSCOPIC
Glucose, UA: 500 mg/dL — AB
Ketones, ur: NEGATIVE mg/dL
Leukocytes, UA: NEGATIVE
Nitrite: NEGATIVE
Protein, ur: NEGATIVE mg/dL
Specific Gravity, Urine: 1.02 (ref 1.005–1.030)
Urobilinogen, UA: 0.2 mg/dL (ref 0.0–1.0)

## 2013-05-21 LAB — COMPREHENSIVE METABOLIC PANEL
AST: 24 U/L (ref 0–37)
BUN: 19 mg/dL (ref 6–23)
Calcium: 9.2 mg/dL (ref 8.4–10.5)
GFR calc Af Amer: >90 mL/min (ref >90)
Glucose, Bld: 172 mg/dL — ABNORMAL HIGH (ref 70–99)
Total Protein: 7.4 g/dL (ref 6.0–8.3)

## 2013-05-21 LAB — LIPASE, BLOOD: Lipase: 37 U/L (ref 11–59)

## 2013-05-21 MED ORDER — MORPHINE SULFATE 4 MG/ML IJ SOLN
2.0000 mg | Freq: Once | INTRAMUSCULAR | Status: AC
  Administered 2013-05-21: 2 mg via INTRAVENOUS
  Filled 2013-05-21: qty 1

## 2013-05-21 MED ORDER — HYDROCODONE-ACETAMINOPHEN 5-325 MG PO TABS: 1.0000 | ORAL_TABLET | ORAL | Status: DC | PRN

## 2013-05-21 MED ORDER — SODIUM CHLORIDE 0.9 % IV BOLUS (SEPSIS): 1000.0000 mL | Freq: Once | INTRAVENOUS | Status: DC

## 2013-05-21 MED ORDER — PANTOPRAZOLE SODIUM 40 MG IV SOLR
40.0000 mg | INTRAVENOUS | Status: AC
  Administered 2013-05-21: 40 mg via INTRAVENOUS
  Filled 2013-05-21: qty 40

## 2013-05-21 MED ORDER — SODIUM CHLORIDE 0.9 % IV BOLUS (SEPSIS)
1000.0000 mL | Freq: Once | INTRAVENOUS | Status: AC
  Administered 2013-05-21: 1000 mL via INTRAVENOUS

## 2013-05-21 MED ORDER — ONDANSETRON HCL 4 MG/2ML IJ SOLN
4.0000 mg | Freq: Once | INTRAMUSCULAR | Status: AC
  Administered 2013-05-21: 4 mg via INTRAVENOUS
  Filled 2013-05-21: qty 2

## 2013-05-21 MED ORDER — GI COCKTAIL ~~LOC~~
30.0000 mL | Freq: Once | ORAL | Status: AC
  Administered 2013-05-21: 30 mL via ORAL
  Filled 2013-05-21: qty 30

## 2013-05-21 MED ORDER — ONDANSETRON 4 MG PO TBDP: 4.0000 mg | ORAL_TABLET | Freq: Three times a day (TID) | ORAL | Status: DC | PRN

## 2013-05-21 MED ORDER — PANTOPRAZOLE SODIUM 20 MG PO TBEC: 20.0000 mg | DELAYED_RELEASE_TABLET | Freq: Every day | ORAL | Status: DC

## 2013-05-21 NOTE — ED Notes (Signed)
Patient states he awoke this am with described dull,aching,constant chest and abdominal pain, patient states he took otc medications with no relief, patient states vomiting x 2 episodes

## 2013-05-21 NOTE — ED Provider Notes (Signed)
Medical screening examination/treatment/procedure(s) were performed by non-physician practitioner and as supervising physician I was immediately available for consultation/collaboration.  EKG Interpretation   None         Hanley Seamen, MD 05/21/13 1907

## 2013-05-21 NOTE — ED Provider Notes (Signed)
CSN: [id number]     Arrival date & time 05/21/13  0549 History   First MD Initiated Contact with Patient 05/21/13 [phone]     Chief Complaint  Patient presents with  . Abdominal Pain  . Chest Pain   (Consider location/radiation/quality/duration/timing/severity/associated sxs/prior Treatment) HPI Comments: Patient is a 67 year old male with a past medical history of prostate cancer who presents with abdominal pain that woke him from sleep around 1am. Patient reports a dull, aching pain in his epigastrium that radiates up into his chest on both sides. The pain is moderate to severe and constant. He reports associated nausea and vomiting x2 this morning which has since resolved but he continues to have pain. Patient does not smoke or drink alcohol. He denies any previous symptoms like these. Patient denies any history of abdominal surgery. No aggravating/alleviating factors. No other associated symptoms.    Past Medical History  Diagnosis Date  . Kidney stone   . Prostate cancer   . Sinusitis    Past Surgical History  Procedure Laterality Date  . Lithotripsy    . Tonsillectomy    . Knee surgery     Family History  Problem Relation Age of Onset  . Melanoma Brother   . Heart disease Father     died from   History  Substance Use Topics  . Smoking status: Never Smoker   . Smokeless tobacco: Never Used  . Alcohol Use: No    Review of Systems  Constitutional: Negative for fever, chills and fatigue.  HENT: Negative for trouble swallowing.   Eyes: Negative for visual disturbance.  Respiratory: Negative for shortness of breath.   Cardiovascular: Positive for chest pain. Negative for palpitations.  Gastrointestinal: Positive for nausea, vomiting and abdominal pain. Negative for diarrhea.  Genitourinary: Negative for dysuria and difficulty urinating.  Musculoskeletal: Negative for arthralgias and neck pain.  Skin: Negative for color change.  Neurological: Negative for dizziness and  weakness.  Psychiatric/Behavioral: Negative for dysphoric mood.    Allergies  Review of patient's allergies indicates no known allergies.  Home Medications   Current Outpatient Rx  Name  Route  Sig  Dispense  Refill  . Cholecalciferol (VITAMIN D-3 PO)   Oral   Take 1 tablet by mouth daily.         . Diphenhydramine-APAP, sleep, (TYLENOL PM EXTRA STRENGTH PO)   Oral   Take 1 tablet by mouth at bedtime as needed (for pain/sleep).         . Glucos-Chondroit-Collag-Hyal (GLUCOSAMINE CHONDROIT-COLLAGEN PO)   Oral   Take 1 capsule by mouth daily.         . Multiple Vitamin (MULTIVITAMIN) tablet   Oral   Take 1 tablet by mouth daily.         . sildenafil (REVATIO) 20 MG tablet   Oral   Take 100 mg by mouth daily as needed (for erectile dysfunction).          BP 162/78  Pulse 77  Temp(Src) 97.9 F (36.6 C) (Oral)  Resp 18  SpO2 98% Physical Exam  Nursing note and vitals reviewed. Constitutional: He is oriented to person, place, and time. He appears well-developed and well-nourished. No distress.  HENT:  Head: Normocephalic and atraumatic.  Eyes: Conjunctivae and EOM are normal.  Neck: Normal range of motion.  Cardiovascular: Normal rate and regular rhythm.  Exam reveals no gallop and no friction rub.   No murmur heard. Pulmonary/Chest: Effort normal and breath sounds normal. He has  no wheezes. He has no rales. He exhibits no tenderness.  Abdominal: Soft. He exhibits no distension. There is tenderness. There is no rebound and no guarding.  RUQ and epigastric tenderness to palpation. No other focal tenderness or peritoneal signs.   Musculoskeletal: Normal range of motion.  Neurological: He is alert and oriented to person, place, and time. Coordination normal.  Speech is goal-oriented. Moves limbs without ataxia.   Skin: Skin is warm and dry.  Psychiatric: He has a normal mood and affect. His behavior is normal.    ED Course  Procedures (including critical  care time)   Date: 05/21/2013  Rate: 76  Rhythm: normal sinus rhythm  QRS Axis: normal  Intervals: normal  ST/T Wave abnormalities: normal  Conduction Disutrbances:none  Narrative Interpretation: NSR with occasional PVC without changes from previous  Old EKG Reviewed: unchanged    Labs Review Labs Reviewed  CBC WITH DIFFERENTIAL - Abnormal; Notable for the following:    Neutrophils Relative % 91 (*)    Neutro Abs 8.9 (*)    Lymphocytes Relative 6 (*)    Lymphs Abs 0.6 (*)    All other components within normal limits  COMPREHENSIVE METABOLIC PANEL - Abnormal; Notable for the following:    Glucose, Bld 172 (*)    GFR calc non Af Amer 89 (*)    All other components within normal limits  URINALYSIS, ROUTINE W REFLEX MICROSCOPIC - Abnormal; Notable for the following:    Glucose, UA 500 (*)    All other components within normal limits  URINE CULTURE  LIPASE, BLOOD  POCT I-STAT TROPONIN I  POCT I-STAT TROPONIN I   Imaging Review Dg Chest 2 View  05/21/2013   CLINICAL DATA:  Chest pain and vomiting.  EXAM: CHEST  2 VIEW  COMPARISON:  PA and lateral chest 11/10/2010.  FINDINGS: The lungs are clear. There is some biapical scarring, unchanged. Heart size is normal. No pneumothorax or pleural effusion.  IMPRESSION: No acute disease.   Electronically Signed   By: Drusilla Kanner M.D.   On: 05/21/2013 07:32    EKG Interpretation   None       MDM   1. Abdominal pain     6:47 AM Labs, troponin and EKG pending. Vitals stable and patient afebrile.   11:18 AM Labs and chest xray unremarkable. EKG shows no changes. Patient's delta troponin unremarkable for changes. Patient reports some relief with morphine, protonix and GI cocktail. I doubt cardiac etiology at this time. Patient will be discharged with protonix, pain medication and nausea medication. Vitals stable and patient afebrile. Patient instructed to follow up with PCP and return the ED with worsening or concerning symptoms.      Emilia Beck, PA-C 05/21/13 1353

## 2013-05-22 LAB — URINE CULTURE: Culture: NO GROWTH

## 2013-05-23 ENCOUNTER — Other Ambulatory Visit: Payer: Self-pay | Admitting: Family Medicine

## 2013-05-23 DIAGNOSIS — R109 Unspecified abdominal pain: Secondary | ICD-10-CM | POA: Diagnosis not present

## 2013-05-24 ENCOUNTER — Encounter (HOSPITAL_COMMUNITY): Payer: Medicare Other | Admitting: Anesthesiology

## 2013-05-24 ENCOUNTER — Ambulatory Visit (HOSPITAL_COMMUNITY)
Admission: RE | Admit: 2013-05-24 | Discharge: 2013-05-24 | Disposition: A | Discharge status: Home / Self Care | Payer: Medicare Other | Source: Ambulatory Visit | Attending: Family Medicine | Admitting: Family Medicine

## 2013-05-24 ENCOUNTER — Observation Stay (HOSPITAL_COMMUNITY): Payer: Medicare Other | Admitting: Anesthesiology

## 2013-05-24 ENCOUNTER — Inpatient Hospital Stay (HOSPITAL_COMMUNITY)
Admission: EM | Admit: 2013-05-24 | Discharge: 2013-05-26 | DRG: 419 | Disposition: A | Discharge status: Home / Self Care | Payer: Medicare Other | Attending: Surgery | Admitting: Surgery

## 2013-05-24 ENCOUNTER — Observation Stay (HOSPITAL_COMMUNITY): Payer: Medicare Other

## 2013-05-24 ENCOUNTER — Encounter (HOSPITAL_COMMUNITY): Payer: Self-pay | Admitting: Emergency Medicine

## 2013-05-24 ENCOUNTER — Encounter (HOSPITAL_COMMUNITY): Admission: EM | Disposition: A | Discharge status: Home / Self Care | Payer: Self-pay | Source: Home / Self Care

## 2013-05-24 DIAGNOSIS — K922 Gastrointestinal hemorrhage, unspecified: Secondary | ICD-10-CM | POA: Diagnosis not present

## 2013-05-24 DIAGNOSIS — Z87442 Personal history of urinary calculi: Secondary | ICD-10-CM

## 2013-05-24 DIAGNOSIS — K810 Acute cholecystitis: Secondary | ICD-10-CM | POA: Diagnosis not present

## 2013-05-24 DIAGNOSIS — Z808 Family history of malignant neoplasm of other organs or systems: Secondary | ICD-10-CM

## 2013-05-24 DIAGNOSIS — C61 Malignant neoplasm of prostate: Secondary | ICD-10-CM | POA: Diagnosis present

## 2013-05-24 DIAGNOSIS — Y842 Radiological procedure and radiotherapy as the cause of abnormal reaction of the patient, or of later complication, without mention of misadventure at the time of the procedure: Secondary | ICD-10-CM | POA: Diagnosis present

## 2013-05-24 DIAGNOSIS — K8000 Calculus of gallbladder with acute cholecystitis without obstruction: Secondary | ICD-10-CM

## 2013-05-24 DIAGNOSIS — R509 Fever, unspecified: Secondary | ICD-10-CM | POA: Insufficient documentation

## 2013-05-24 DIAGNOSIS — K8020 Calculus of gallbladder without cholecystitis without obstruction: Secondary | ICD-10-CM | POA: Insufficient documentation

## 2013-05-24 DIAGNOSIS — R109 Unspecified abdominal pain: Secondary | ICD-10-CM | POA: Insufficient documentation

## 2013-05-24 DIAGNOSIS — Z8249 Family history of ischemic heart disease and other diseases of the circulatory system: Secondary | ICD-10-CM

## 2013-05-24 DIAGNOSIS — K6289 Other specified diseases of anus and rectum: Secondary | ICD-10-CM | POA: Diagnosis present

## 2013-05-24 DIAGNOSIS — Z79899 Other long term (current) drug therapy: Secondary | ICD-10-CM

## 2013-05-24 DIAGNOSIS — Z8673 Personal history of transient ischemic attack (TIA), and cerebral infarction without residual deficits: Secondary | ICD-10-CM

## 2013-05-24 DIAGNOSIS — K828 Other specified diseases of gallbladder: Secondary | ICD-10-CM | POA: Insufficient documentation

## 2013-05-24 DIAGNOSIS — Z923 Personal history of irradiation: Secondary | ICD-10-CM

## 2013-05-24 HISTORY — PX: CHOLECYSTECTOMY: SHX55

## 2013-05-24 LAB — CBC WITH DIFFERENTIAL/PLATELET
Basophils Absolute: 0 10*3/uL (ref 0.0–0.1)
Basophils Relative: 0 % (ref 0–1)
Eosinophils Absolute: 0 10*3/uL (ref 0.0–0.7)
Eosinophils Relative: 0 % (ref 0–5)
HCT: 37.3 % — ABNORMAL LOW (ref 39.0–52.0)
Hemoglobin: 13 g/dL (ref 13.0–17.0)
Lymphocytes Relative: 8 % — ABNORMAL LOW (ref 12–46)
Lymphs Abs: 1 10*3/uL (ref 0.7–4.0)
MCH: 29.7 pg (ref 26.0–34.0)
MCHC: 34.9 g/dL (ref 30.0–36.0)
MCV: 85.2 fL (ref 78.0–100.0)
Monocytes Absolute: 1 10*3/uL (ref 0.1–1.0)
Monocytes Relative: 9 % (ref 3–12)
Neutro Abs: 9.8 10*3/uL — ABNORMAL HIGH (ref 1.7–7.7)
Neutrophils Relative %: 83 % — ABNORMAL HIGH (ref 43–77)
Platelets: 196 10*3/uL (ref 150–400)
RBC: 4.38 MIL/uL (ref 4.22–5.81)
RDW: 13.5 % (ref 11.5–15.5)
WBC: 11.8 10*3/uL — ABNORMAL HIGH (ref 4.0–10.5)

## 2013-05-24 LAB — LACTIC ACID, PLASMA: Lactic Acid, Venous: 1.2 mmol/L (ref 0.5–2.2)

## 2013-05-24 LAB — COMPREHENSIVE METABOLIC PANEL
Alkaline Phosphatase: 84 U/L (ref 39–117)
CO2: 24 mEq/L (ref 19–32)
Calcium: 9.1 mg/dL (ref 8.4–10.5)
Creatinine, Ser: 0.97 mg/dL (ref 0.50–1.35)
GFR calc Af Amer: >90 mL/min (ref >90)
GFR calc non Af Amer: 82 mL/min — ABNORMAL LOW (ref >90)
Glucose, Bld: 130 mg/dL — ABNORMAL HIGH (ref 70–99)
Sodium: 136 mEq/L (ref 135–145)
Total Protein: 7.2 g/dL (ref 6.0–8.3)

## 2013-05-24 LAB — LIPASE, BLOOD: Lipase: 26 U/L (ref 11–59)

## 2013-05-24 SURGERY — LAPAROSCOPIC CHOLECYSTECTOMY WITH INTRAOPERATIVE CHOLANGIOGRAM
Anesthesia: General | Site: Abdomen

## 2013-05-24 MED ORDER — LACTATED RINGERS IV SOLN
INTRAVENOUS | Status: DC | PRN
  Administered 2013-05-24: 1000 mL

## 2013-05-24 MED ORDER — PROPOFOL 10 MG/ML IV BOLUS
INTRAVENOUS | Status: AC
  Filled 2013-05-24: qty 20

## 2013-05-24 MED ORDER — IOHEXOL 300 MG/ML  SOLN
INTRAMUSCULAR | Status: DC | PRN
  Administered 2013-05-24: 50 mL via INTRAVENOUS

## 2013-05-24 MED ORDER — DIPHENHYDRAMINE HCL 12.5 MG/5ML PO ELIX: 12.5000 mg | ORAL_SOLUTION | Freq: Four times a day (QID) | ORAL | Status: DC | PRN

## 2013-05-24 MED ORDER — ONDANSETRON HCL 4 MG PO TABS: 4.0000 mg | ORAL_TABLET | Freq: Four times a day (QID) | ORAL | Status: DC | PRN

## 2013-05-24 MED ORDER — FENTANYL CITRATE 0.05 MG/ML IJ SOLN
INTRAMUSCULAR | Status: AC
  Filled 2013-05-24: qty 2

## 2013-05-24 MED ORDER — ROCURONIUM BROMIDE 100 MG/10ML IV SOLN
INTRAVENOUS | Status: AC
  Filled 2013-05-24: qty 1

## 2013-05-24 MED ORDER — ONDANSETRON HCL 4 MG/2ML IJ SOLN: 4.0000 mg | Freq: Four times a day (QID) | INTRAMUSCULAR | Status: DC | PRN

## 2013-05-24 MED ORDER — FENTANYL CITRATE 0.05 MG/ML IJ SOLN
25.0000 ug | INTRAMUSCULAR | Status: DC | PRN
  Administered 2013-05-24 (×2): 25 ug via INTRAVENOUS

## 2013-05-24 MED ORDER — SODIUM CHLORIDE 0.9 % IV SOLN
INTRAVENOUS | Status: DC
  Administered 2013-05-24: 13:00:00 via INTRAVENOUS

## 2013-05-24 MED ORDER — SODIUM CHLORIDE 0.9 % IV SOLN
40.0000 mg | Freq: Once | INTRAVENOUS | Status: AC
  Administered 2013-05-24: 40 mg via INTRAVENOUS
  Filled 2013-05-24: qty 4

## 2013-05-24 MED ORDER — FENTANYL CITRATE 0.05 MG/ML IJ SOLN
INTRAMUSCULAR | Status: AC
  Filled 2013-05-24: qty 5

## 2013-05-24 MED ORDER — ACETAMINOPHEN 325 MG PO TABS: 650.0000 mg | ORAL_TABLET | Freq: Four times a day (QID) | ORAL | Status: DC | PRN

## 2013-05-24 MED ORDER — PROPOFOL 10 MG/ML IV BOLUS
INTRAVENOUS | Status: DC | PRN
  Administered 2013-05-24: 200 mg via INTRAVENOUS

## 2013-05-24 MED ORDER — LIDOCAINE HCL (CARDIAC) 20 MG/ML IV SOLN
INTRAVENOUS | Status: DC | PRN
  Administered 2013-05-24: 100 mg via INTRAVENOUS

## 2013-05-24 MED ORDER — POTASSIUM CHLORIDE IN NACL 20-0.9 MEQ/L-% IV SOLN
INTRAVENOUS | Status: DC
  Filled 2013-05-24 (×3): qty 1000

## 2013-05-24 MED ORDER — HYDROMORPHONE HCL PF 1 MG/ML IJ SOLN: 0.5000 mg | INTRAMUSCULAR | Status: DC | PRN

## 2013-05-24 MED ORDER — LIDOCAINE HCL (CARDIAC) 20 MG/ML IV SOLN
INTRAVENOUS | Status: AC
  Filled 2013-05-24: qty 5

## 2013-05-24 MED ORDER — PROMETHAZINE HCL 25 MG/ML IJ SOLN: 6.2500 mg | INTRAMUSCULAR | Status: DC | PRN

## 2013-05-24 MED ORDER — METOCLOPRAMIDE HCL 5 MG/ML IJ SOLN
INTRAMUSCULAR | Status: DC | PRN
  Administered 2013-05-24: 10 mg via INTRAVENOUS

## 2013-05-24 MED ORDER — LACTATED RINGERS IV SOLN
INTRAVENOUS | Status: DC | PRN
  Administered 2013-05-24: 15:00:00 via INTRAVENOUS

## 2013-05-24 MED ORDER — BUPIVACAINE-EPINEPHRINE PF 0.25-1:200000 % IJ SOLN
INTRAMUSCULAR | Status: AC
  Filled 2013-05-24: qty 30

## 2013-05-24 MED ORDER — KETOROLAC TROMETHAMINE 30 MG/ML IJ SOLN: 15.0000 mg | Freq: Once | INTRAMUSCULAR | Status: DC | PRN

## 2013-05-24 MED ORDER — OXYCODONE-ACETAMINOPHEN 5-325 MG PO TABS: 1.0000 | ORAL_TABLET | ORAL | Status: DC | PRN

## 2013-05-24 MED ORDER — SODIUM CHLORIDE 0.9 % IV SOLN
3.0000 g | Freq: Four times a day (QID) | INTRAVENOUS | Status: DC
  Administered 2013-05-24 – 2013-05-26 (×6): 3 g via INTRAVENOUS
  Filled 2013-05-24 (×8): qty 3

## 2013-05-24 MED ORDER — BUPIVACAINE-EPINEPHRINE 0.25% -1:200000 IJ SOLN
INTRAMUSCULAR | Status: DC | PRN
  Administered 2013-05-24: 20 mL

## 2013-05-24 MED ORDER — SUCCINYLCHOLINE CHLORIDE 20 MG/ML IJ SOLN
INTRAMUSCULAR | Status: AC
  Filled 2013-05-24: qty 1

## 2013-05-24 MED ORDER — ROCURONIUM BROMIDE 100 MG/10ML IV SOLN
INTRAVENOUS | Status: DC | PRN
  Administered 2013-05-24: 10 mg via INTRAVENOUS
  Administered 2013-05-24: 40 mg via INTRAVENOUS

## 2013-05-24 MED ORDER — GLYCOPYRROLATE 0.2 MG/ML IJ SOLN
INTRAMUSCULAR | Status: AC
  Filled 2013-05-24: qty 3

## 2013-05-24 MED ORDER — HEPARIN SODIUM (PORCINE) 5000 UNIT/ML IJ SOLN
5000.0000 [IU] | Freq: Three times a day (TID) | INTRAMUSCULAR | Status: DC
  Administered 2013-05-25 – 2013-05-26 (×4): 5000 [IU] via SUBCUTANEOUS
  Filled 2013-05-24 (×7): qty 1

## 2013-05-24 MED ORDER — ACETAMINOPHEN 650 MG RE SUPP: 650.0000 mg | Freq: Four times a day (QID) | RECTAL | Status: DC | PRN

## 2013-05-24 MED ORDER — NEOSTIGMINE METHYLSULFATE 1 MG/ML IJ SOLN
INTRAMUSCULAR | Status: DC | PRN
  Administered 2013-05-24: 4 mg via INTRAVENOUS

## 2013-05-24 MED ORDER — ONDANSETRON HCL 4 MG/2ML IJ SOLN
INTRAMUSCULAR | Status: DC | PRN
  Administered 2013-05-24: 4 mg via INTRAVENOUS

## 2013-05-24 MED ORDER — AMPICILLIN-SULBACTAM SODIUM 3 (2-1) G IJ SOLR
3.0000 g | Freq: Four times a day (QID) | INTRAMUSCULAR | Status: DC
  Filled 2013-05-24 (×3): qty 3

## 2013-05-24 MED ORDER — DIPHENHYDRAMINE HCL 50 MG/ML IJ SOLN: 12.5000 mg | Freq: Four times a day (QID) | INTRAMUSCULAR | Status: DC | PRN

## 2013-05-24 MED ORDER — METOCLOPRAMIDE HCL 5 MG/ML IJ SOLN
INTRAMUSCULAR | Status: AC
  Filled 2013-05-24: qty 2

## 2013-05-24 MED ORDER — FENTANYL CITRATE 0.05 MG/ML IJ SOLN
INTRAMUSCULAR | Status: DC | PRN
  Administered 2013-05-24 (×5): 50 ug via INTRAVENOUS

## 2013-05-24 MED ORDER — POTASSIUM CHLORIDE IN NACL 20-0.9 MEQ/L-% IV SOLN
INTRAVENOUS | Status: DC
  Administered 2013-05-24 – 2013-05-26 (×2): via INTRAVENOUS
  Filled 2013-05-24 (×5): qty 1000

## 2013-05-24 MED ORDER — MORPHINE SULFATE 2 MG/ML IJ SOLN
2.0000 mg | INTRAMUSCULAR | Status: DC | PRN
  Administered 2013-05-24: 2 mg via INTRAVENOUS
  Filled 2013-05-24: qty 1

## 2013-05-24 MED ORDER — GLYCOPYRROLATE 0.2 MG/ML IJ SOLN
INTRAMUSCULAR | Status: DC | PRN
  Administered 2013-05-24: .6 mg via INTRAVENOUS

## 2013-05-24 MED ORDER — SUCCINYLCHOLINE CHLORIDE 20 MG/ML IJ SOLN
INTRAMUSCULAR | Status: DC | PRN
  Administered 2013-05-24: 100 mg via INTRAVENOUS

## 2013-05-24 MED ORDER — SODIUM CHLORIDE 0.9 % IV SOLN
3.0000 g | Freq: Four times a day (QID) | INTRAVENOUS | Status: DC
  Administered 2013-05-24: 3 g via INTRAVENOUS
  Filled 2013-05-24 (×2): qty 3

## 2013-05-24 SURGICAL SUPPLY — 43 items
ADH SKN CLS APL DERMABOND .7 (GAUZE/BANDAGES/DRESSINGS) ×1
APPLIER CLIP ROT 10 11.4 M/L (STAPLE) ×2
APR CLP MED LRG 11.4X10 (STAPLE) ×1
BAG SPEC RTRVL LRG 6X4 10 (ENDOMECHANICALS)
CANISTER SUCTION 2500CC (MISCELLANEOUS) ×2 IMPLANT
CATH REDDICK CHOLANGI 4FR 50CM (CATHETERS) IMPLANT
CHLORAPREP W/TINT 26ML (MISCELLANEOUS) ×2 IMPLANT
CLIP APPLIE ROT 10 11.4 M/L (STAPLE) ×1 IMPLANT
COVER MAYO STAND STRL (DRAPES) ×2 IMPLANT
DECANTER SPIKE VIAL GLASS SM (MISCELLANEOUS) ×2 IMPLANT
DERMABOND ADVANCED (GAUZE/BANDAGES/DRESSINGS) ×1
DERMABOND ADVANCED .7 DNX12 (GAUZE/BANDAGES/DRESSINGS) ×1 IMPLANT
DRAIN CHANNEL 19F RND (DRAIN) ×1 IMPLANT
DRAPE C-ARM 42X120 X-RAY (DRAPES) ×2 IMPLANT
DRAPE LAPAROSCOPIC ABDOMINAL (DRAPES) ×2 IMPLANT
DRAPE UTILITY XL STRL (DRAPES) ×2 IMPLANT
ELECT REM PT RETURN 9FT ADLT (ELECTROSURGICAL) ×2
ELECTRODE REM PT RTRN 9FT ADLT (ELECTROSURGICAL) ×1 IMPLANT
EVACUATOR SILICONE 100CC (DRAIN) ×1 IMPLANT
GLOVE BIOGEL PI IND STRL 7.5 (GLOVE) ×1 IMPLANT
GLOVE BIOGEL PI INDICATOR 7.5 (GLOVE) ×1
GLOVE SS BIOGEL STRL SZ 7.5 (GLOVE) ×1 IMPLANT
GLOVE SUPERSENSE BIOGEL SZ 7.5 (GLOVE) ×1
GOWN STRL REIN XL XLG (GOWN DISPOSABLE) ×4 IMPLANT
HEMOSTAT SNOW SURGICEL 2X4 (HEMOSTASIS) ×1 IMPLANT
KIT BASIN OR (CUSTOM PROCEDURE TRAY) ×2 IMPLANT
NS IRRIG 1000ML POUR BTL (IV SOLUTION) ×2 IMPLANT
POUCH SPECIMEN RETRIEVAL 10MM (ENDOMECHANICALS) IMPLANT
SCISSORS LAP 5X35 DISP (ENDOMECHANICALS) ×2 IMPLANT
SET CHOLANGIOGRAPH MIX (MISCELLANEOUS) ×2 IMPLANT
SET IRRIG TUBING LAPAROSCOPIC (IRRIGATION / IRRIGATOR) ×2 IMPLANT
SLEEVE XCEL OPT CAN 5 100 (ENDOMECHANICALS) ×2 IMPLANT
SOLUTION ANTI FOG 6CC (MISCELLANEOUS) ×2 IMPLANT
SPONGE DRAIN TRACH 4X4 STRL 2S (GAUZE/BANDAGES/DRESSINGS) ×1 IMPLANT
SUT ETHILON 2 0 PS N (SUTURE) ×1 IMPLANT
SUT MNCRL AB 4-0 PS2 18 (SUTURE) ×2 IMPLANT
TOWEL OR 17X26 10 PK STRL BLUE (TOWEL DISPOSABLE) ×2 IMPLANT
TOWEL OR NON WOVEN STRL DISP B (DISPOSABLE) ×2 IMPLANT
TRAY LAP CHOLE (CUSTOM PROCEDURE TRAY) ×2 IMPLANT
TROCAR BLADELESS OPT 5 100 (ENDOMECHANICALS) ×2 IMPLANT
TROCAR XCEL BLUNT TIP 100MML (ENDOMECHANICALS) ×2 IMPLANT
TROCAR XCEL NON-BLD 11X100MML (ENDOMECHANICALS) ×2 IMPLANT
TUBING INSUFFLATION 10FT LAP (TUBING) ×2 IMPLANT

## 2013-05-24 NOTE — Anesthesia Preprocedure Evaluation (Addendum)
Anesthesia Evaluation  Patient identified by MRN, date of birth, ID band Patient awake    Reviewed: Allergy & Precautions, H&P , NPO status , Patient's Chart, lab work & pertinent test results  Airway Mallampati: II TM Distance: >3 FB Neck ROM: Full    Dental no notable dental hx.    Pulmonary neg pulmonary ROS,  breath sounds clear to auscultation  Pulmonary exam normal       Cardiovascular negative cardio ROS  Rhythm:Regular Rate:Normal     Neuro/Psych negative neurological ROS  negative psych ROS   GI/Hepatic negative GI ROS, Neg liver ROS,   Endo/Other  negative endocrine ROS  Renal/GU negative Renal ROS  negative genitourinary   Musculoskeletal negative musculoskeletal ROS (+)   Abdominal   Peds negative pediatric ROS (+)  Hematology negative hematology ROS (+)   Anesthesia Other Findings   Reproductive/Obstetrics negative OB ROS                           Anesthesia Physical Anesthesia Plan  ASA: I  Anesthesia Plan: General   Post-op Pain Management:    Induction: Intravenous, Rapid sequence and Cricoid pressure planned  Airway Management Planned: Oral ETT  Additional Equipment:   Intra-op Plan:   Post-operative Plan: Extubation in OR  Informed Consent: I have reviewed the patients History and Physical, chart, labs and discussed the procedure including the risks, benefits and alternatives for the proposed anesthesia with the patient or authorized representative who has indicated his/her understanding and acceptance.   Dental advisory given  Plan Discussed with: CRNA and Surgeon  Anesthesia Plan Comments:         Anesthesia Quick Evaluation  

## 2013-05-24 NOTE — ED Provider Notes (Signed)
CSN: [id number]     Arrival date & time 05/24/13  1159 History   First MD Initiated Contact with Patient 05/24/13 1218     Chief Complaint  Patient presents with  . Abdominal Pain    HPI Pt was seen at 1235.  Per pt, c/o gradual onset and persistence of constant generalized upper abd "pain" for the past 4 days.  Has been associated with multiple intermittent episodes of N/V and home fevers yesterday.  Describes the abd pain as "aching." States he was evaluated in the ED 4 days ago for same and f/u with his PMD yesterday. States he had an outpatient abd [place] this morning, was called by his PMD afterwards and told to go to the ED to "get admitted and have Surgery on my gallbladder." Last PO intake approx 10am today. Denies diarrhea, no back pain, no rash, no CP/SOB, no black or blood in stools or emesis.        Past Medical History  Diagnosis Date  . Kidney stone   . Prostate cancer   . Sinusitis    Past Surgical History  Procedure Laterality Date  . Lithotripsy    . Tonsillectomy    . Knee surgery     Family History  Problem Relation Age of Onset  . Melanoma Brother   . Heart disease Father     died from   History  Substance Use Topics  . Smoking status: Never Smoker   . Smokeless tobacco: Never Used  . Alcohol Use: No    Review of Systems ROS: Statement: All systems negative except as marked or noted in the HPI; Constitutional: +fever and chills. ; ; Eyes: Negative for eye pain, redness and discharge. ; ; ENMT: Negative for ear pain, hoarseness, nasal congestion, sinus pressure and sore throat. ; ; Cardiovascular: Negative for chest pain, palpitations, diaphoresis, dyspnea and peripheral edema. ; ; Respiratory: Negative for cough, wheezing and stridor. ; ; Gastrointestinal: +N/V, abd pain. Negative for diarrhea, blood in stool, hematemesis, jaundice and rectal bleeding. ; ; Genitourinary: Negative for dysuria, flank pain and hematuria. ; ; Musculoskeletal: Negative for back pain  and neck pain. Negative for swelling and trauma.; ; Skin: Negative for pruritus, rash, abrasions, blisters, bruising and skin lesion.; ; Neuro: Negative for headache, lightheadedness and neck stiffness. Negative for weakness, altered level of consciousness , altered mental status, extremity weakness, paresthesias, involuntary movement, seizure and syncope.       Allergies  Review of patient's allergies indicates no known allergies.  Home Medications   Current Outpatient Rx  Name  Route  Sig  Dispense  Refill  . Cholecalciferol (VITAMIN D-3 PO)   Oral   Take 1 tablet by mouth daily.         . Diphenhydramine-APAP, sleep, (TYLENOL PM EXTRA STRENGTH PO)   Oral   Take 1 tablet by mouth at bedtime as needed (for pain/sleep).         . Glucos-Chondroit-Collag-Hyal (GLUCOSAMINE CHONDROIT-COLLAGEN PO)   Oral   Take 1 capsule by mouth daily.         Marland Kitchen HYDROcodone-acetaminophen (NORCO/VICODIN) 5-325 MG per tablet   Oral   Take 1-2 tablets by mouth every 4 (four) hours as needed.   14 tablet   0   . Multiple Vitamin (MULTIVITAMIN) tablet   Oral   Take 1 tablet by mouth daily.         . pantoprazole (PROTONIX) 20 MG tablet   Oral   Take 1 tablet (  20 mg total) by mouth daily.   20 tablet   0    BP 138/56  Pulse 93  Temp(Src) 99.2 F (37.3 C) (Oral)  Resp 20  SpO2 95% Physical Exam 1240: Physical examination:  Nursing notes reviewed; Vital signs and O2 SAT reviewed;  Constitutional: Well developed, Well nourished, Well hydrated, In no acute distress; Head:  Normocephalic, atraumatic; Eyes: EOMI, PERRL, No scleral icterus; ENMT: Mouth and pharynx normal, Mucous membranes moist; Neck: Supple, Full range of motion, No lymphadenopathy; Cardiovascular: Regular rate and rhythm, No gallop; Respiratory: Breath sounds clear & equal bilaterally, No rales, rhonchi, wheezes.  Speaking full sentences with ease, Normal respiratory effort/excursion; Chest: Nontender, Movement normal;  Abdomen: Soft, Nontender, Nondistended, Normal bowel sounds; Genitourinary: No CVA tenderness; Extremities: Pulses normal, No tenderness, No edema, No calf edema or asymmetry.; Neuro: AA&Ox3, Major CN grossly intact.  Speech clear. No gross focal motor or sensory deficits in extremities.; Skin: Color normal, Warm, Dry.   ED Course  Procedures   EKG Interpretation   None       MDM  MDM Reviewed: previous chart, nursing note and vitals Reviewed previous: labs, ultrasound and ECG Interpretation: labs   Dg Chest 2 View 05/21/2013   CLINICAL DATA:  Chest pain and vomiting.  EXAM: CHEST  2 VIEW  COMPARISON:  PA and lateral chest 11/10/2010.  FINDINGS: The lungs are clear. There is some biapical scarring, unchanged. Heart size is normal. No pneumothorax or pleural effusion.  IMPRESSION: No acute disease.   Electronically Signed   By: Drusilla Kanner M.D.   On: 05/21/2013 07:32   US Abdomen Complete 05/24/2013   ADDENDUM REPORT: 05/24/2013 09:01  ADDENDUM: IMPRESSION: 1. Gallbladder sludge, stones and gallbladder wall thickening. Findings are concerning for acute cholecystitis. These results will be called to the ordering clinician or representative by the Radiologist Assistant, and communication documented in the PACS Dashboard   Electronically Signed   By: Signa Kell M.D.   On: 05/24/2013 09:01   05/24/2013   CLINICAL DATA:  Abdominal pain and fever.  EXAM: ULTRASOUND ABDOMEN COMPLETE  COMPARISON:  09/01/2012  FINDINGS: Gallbladder:  Sludge and small stones are identified within the gallbladder. The gallbladder wall is thickened measuring 7.4 mm.  Common bile duct:  Diameter: 4.5 mm.  Liver:  No focal lesion identified. Within normal limits in parenchymal echogenicity.  IVC:  No abnormality visualized.  Pancreas:  Visualized portion unremarkable.  Spleen:  Size and appearance within normal limits.  Right Kidney:  Length: 11.7 cm. Echogenicity within normal limits. No mass or hydronephrosis  visualized.  Left Kidney:  Length: 11.4 cm. Echogenicity within normal limits. No mass or hydronephrosis visualized.  Abdominal aorta:  No aneurysm visualized.  Other findings:  None.  IMPRESSION: 1. Sludge stress sec gallbladder sludge, stones and gallbladder wall thickening. Findings are concerning for acute cholecystitis. These results will be called to the ordering clinician or representative by the Radiologist Assistant, and communication documented in the PACS Dashboard.  Electronically Signed: By: Signa Kell M.D. On: 05/24/2013 08:41     Results for orders placed during the hospital encounter of 05/24/13  CBC WITH DIFFERENTIAL      Result Value Range   WBC 11.8 (*) 4.0 - 10.5 K/uL   RBC 4.38  4.22 - 5.81 MIL/uL   Hemoglobin 13.0  13.0 - 17.0 g/dL   HCT 16.1 (*) 09.6 - 04.5 %   MCV 85.2  78.0 - 100.0 fL   MCH 29.7  26.0 -  34.0 pg   MCHC 34.9  30.0 - 36.0 g/dL   RDW 29.5  62.1 - 30.8 %   Platelets 196  150 - 400 K/uL   Neutrophils Relative % 83 (*) 43 - 77 %   Neutro Abs 9.8 (*) 1.7 - 7.7 K/uL   Lymphocytes Relative 8 (*) 12 - 46 %   Lymphs Abs 1.0  0.7 - 4.0 K/uL   Monocytes Relative 9  3 - 12 %   Monocytes Absolute 1.0  0.1 - 1.0 K/uL   Eosinophils Relative 0  0 - 5 %   Eosinophils Absolute 0.0  0.0 - 0.7 K/uL   Basophils Relative 0  0 - 1 %   Basophils Absolute 0.0  0.0 - 0.1 K/uL  COMPREHENSIVE METABOLIC PANEL      Result Value Range   Sodium 136  135 - 145 mEq/L   Potassium 4.0  3.5 - 5.1 mEq/L   Chloride 99  96 - 112 mEq/L   CO2 24  19 - 32 mEq/L   Glucose, Bld 130 (*) 70 - 99 mg/dL   BUN 14  6 - 23 mg/dL   Creatinine, Ser 6.57  0.50 - 1.35 mg/dL   Calcium 9.1  8.4 - 84.6 mg/dL   Total Protein 7.2  6.0 - 8.3 g/dL   Albumin 3.5  3.5 - 5.2 g/dL   AST 20  0 - 37 U/L   ALT 26  0 - 53 U/L   Alkaline Phosphatase 84  39 - 117 U/L   Total Bilirubin 0.7  0.3 - 1.2 mg/dL   GFR calc non Af Amer 82 (*) >90 mL/min   GFR calc Af Amer >90  >90 mL/min  LIPASE, BLOOD       Result Value Range   Lipase 26  11 - 59 U/L  LACTIC ACID, PLASMA      Result Value Range   Lactic Acid, Venous 1.2  0.5 - 2.2 mmol/L     1330:  Will start IV abx for acute cholecystitis. Dx and testing d/w pt and family.  Questions answered.  Verb understanding, agreeable to admit. T/C to General Surgery PA Will, case discussed, including:  HPI, pertinent PM/SHx, VS/PE, dx testing, ED course and treatment:  Agreeable to admit, requests he will come to ED for eval.      Laray Anger, DO 05/26/13 1550

## 2013-05-24 NOTE — Transfer of Care (Signed)
Immediate Anesthesia Transfer of Care Note  Patient: Joseph Marsh  Procedure(s) Performed: Procedure(s): LAPAROSCOPIC CHOLECYSTECTOMY (N/A)  Patient Location: PACU  Anesthesia Type:General  Level of Consciousness: awake, alert , oriented and patient cooperative  Airway & Oxygen Therapy: Patient Spontanous Breathing and Patient connected to face mask oxygen  Post-op Assessment: Report given to PACU RN, Post -op Vital signs reviewed and stable and Patient moving all extremities  Post vital signs: Reviewed and stable  Complications: No apparent anesthesia complications

## 2013-05-24 NOTE — H&P (Signed)
Joseph Marsh is an 67 y.o. male.   PCP: Dr. Shaune Pollack Chief Complaint: Abdominal pain, some nausea and vomiting, fever. HPI:  This is a normally healty male who awoke 05/21/13 early 1:30 AM with a dull abdominal pain, followed by nausea and vomiting, around 2:30 AM.  He did not feel better and went to the [facility] ER and was evaluated.  Work up was negative.  He was sent home with medicines for GERD, pain and nausea with instructions to see PCP.  He made arrangements with PCP and was seen yesterday.  His symptoms got a little better with pain medicines, he didn't fill the rx for nausea.  Tuesday he still felt bad, he could eat some soup, and yesterday when he saw PCP he had a fever of 101.  She ordered a abd [place] for this AM.  This shows stones and sludge, gallbladder wall thickening consistant with acute cholecystitis.  He was seen and evaluated by Dr. Johna Sheriff and we plan cholecystectomy as soon as the OR is ready.  WBC was elevated, LFT's were normal, lipase was normal.  Past Medical History  Diagnosis Date  Kidney stone   . Prostate cancer with radiation treatment     Radiation proctitis   Hx of TIA   Sinusitis     Past Surgical History  Procedure Laterality Date  . Lithotripsy    . Tonsillectomy    . Knee surgery      Family History  Problem Relation Age of Onset  . Melanoma Brother   . Heart disease Father     died from   Social History:  reports that he has never smoked. He has never used smokeless tobacco. He reports that he does not drink alcohol or use illicit drugs. Tobacco: none Drugs:  None Etho:  None Married Forensic scientist Allergies: No Known Allergies  Prior to Admission medications   Medication Sig Start Date End Date Taking? Authorizing Provider  acetaminophen (TYLENOL) 500 MG tablet Take 1,000 mg by mouth every 6 (six) hours as needed.   Yes Historical Provider, MD  Cholecalciferol (VITAMIN D-3 PO) Take 1 tablet by mouth daily.   Yes Historical Provider,  MD  Diphenhydramine-APAP, sleep, (TYLENOL PM EXTRA STRENGTH PO) Take 1 tablet by mouth at bedtime as needed (for pain/sleep).   Yes Historical Provider, MD  Glucos-Chondroit-Collag-Hyal (GLUCOSAMINE CHONDROIT-COLLAGEN PO) Take 1 capsule by mouth daily.   Yes Historical Provider, MD  HYDROcodone-acetaminophen (NORCO/VICODIN) 5-325 MG per tablet Take 1-2 tablets by mouth every 4 (four) hours as needed. 05/21/13  Yes Kaitlyn Szekalski, PA-C  Melatonin ([employer] MELATONIN) 3 MG TABS Take 1 tablet by mouth daily.   Yes Historical Provider, MD  Multiple Vitamin (MULTIVITAMIN) tablet Take 1 tablet by mouth daily.   Yes Historical Provider, MD  pantoprazole (PROTONIX) 20 MG tablet Take 1 tablet (20 mg total) by mouth daily. 05/21/13  Yes Emilia Beck, PA-C       Results for orders placed during the hospital encounter of 05/24/13 (from the past 48 hour(s))  CBC WITH DIFFERENTIAL     Status: Abnormal   Collection Time    05/24/13 12:25 PM      Result Value Range   WBC 11.8 (*) 4.0 - 10.5 K/uL   RBC 4.38  4.22 - 5.81 MIL/uL   Hemoglobin 13.0  13.0 - 17.0 g/dL   HCT 09.8 (*) 11.9 - 14.7 %   MCV 85.2  78.0 - 100.0 fL   MCH 29.7  26.0 -  34.0 pg   MCHC 34.9  30.0 - 36.0 g/dL   RDW 16.1  09.6 - 04.5 %   Platelets 196  150 - 400 K/uL   Neutrophils Relative % 83 (*) 43 - 77 %   Neutro Abs 9.8 (*) 1.7 - 7.7 K/uL   Lymphocytes Relative 8 (*) 12 - 46 %   Lymphs Abs 1.0  0.7 - 4.0 K/uL   Monocytes Relative 9  3 - 12 %   Monocytes Absolute 1.0  0.1 - 1.0 K/uL   Eosinophils Relative 0  0 - 5 %   Eosinophils Absolute 0.0  0.0 - 0.7 K/uL   Basophils Relative 0  0 - 1 %   Basophils Absolute 0.0  0.0 - 0.1 K/uL  COMPREHENSIVE METABOLIC PANEL     Status: Abnormal   Collection Time    05/24/13 12:25 PM      Result Value Range   Sodium 136  135 - 145 mEq/L   Potassium 4.0  3.5 - 5.1 mEq/L   Chloride 99  96 - 112 mEq/L   CO2 24  19 - 32 mEq/L   Glucose, Bld 130 (*) 70 - 99 mg/dL   BUN 14  6 - 23 mg/dL    Creatinine, Ser 4.09  0.50 - 1.35 mg/dL   Calcium 9.1  8.4 - 81.1 mg/dL   Total Protein 7.2  6.0 - 8.3 g/dL   Albumin 3.5  3.5 - 5.2 g/dL   AST 20  0 - 37 U/L   ALT 26  0 - 53 U/L   Alkaline Phosphatase 84  39 - 117 U/L   Total Bilirubin 0.7  0.3 - 1.2 mg/dL   GFR calc non Af Amer 82 (*) >90 mL/min   GFR calc Af Amer >90  >90 mL/min   Comment: (NOTE)     The eGFR has been calculated using the CKD EPI equation.     This calculation has not been validated in all clinical situations.     eGFR's persistently <90 mL/min signify possible Chronic Kidney     Disease.  LIPASE, BLOOD     Status: None   Collection Time    05/24/13 12:25 PM      Result Value Range   Lipase 26  11 - 59 U/L  LACTIC ACID, PLASMA     Status: None   Collection Time    05/24/13 12:25 PM      Result Value Range   Lactic Acid, Venous 1.2  0.5 - 2.2 mmol/L   US Abdomen Complete  05/24/2013   ADDENDUM REPORT: 05/24/2013 09:01  ADDENDUM: IMPRESSION: 1. Gallbladder sludge, stones and gallbladder wall thickening. Findings are concerning for acute cholecystitis. These results will be called to the ordering clinician or representative by the Radiologist Assistant, and communication documented in the PACS Dashboard   Electronically Signed   By: Signa Kell M.D.   On: 05/24/2013 09:01   05/24/2013   CLINICAL DATA:  Abdominal pain and fever.  EXAM: ULTRASOUND ABDOMEN COMPLETE  COMPARISON:  09/01/2012  FINDINGS: Gallbladder:  Sludge and small stones are identified within the gallbladder. The gallbladder wall is thickened measuring 7.4 mm.  Common bile duct:  Diameter: 4.5 mm.  Liver:  No focal lesion identified. Within normal limits in parenchymal echogenicity.  IVC:  No abnormality visualized.  Pancreas:  Visualized portion unremarkable.  Spleen:  Size and appearance within normal limits.  Right Kidney:  Length: 11.7 cm. Echogenicity within normal limits. No mass  or hydronephrosis visualized.  Left Kidney:  Length: 11.4 cm.  Echogenicity within normal limits. No mass or hydronephrosis visualized.  Abdominal aorta:  No aneurysm visualized.  Other findings:  None.  IMPRESSION: 1. Sludge stress sec gallbladder sludge, stones and gallbladder wall thickening. Findings are concerning for acute cholecystitis. These results will be called to the ordering clinician or representative by the Radiologist Assistant, and communication documented in the PACS Dashboard.  Electronically Signed: By: Signa Kell M.D. On: 05/24/2013 08:41    Review of Systems  Constitutional: Positive for fever (101 yesterday with PCP). Negative for chills, weight loss, malaise/fatigue and diaphoresis.  HENT:       Chronic sinus issues   Eyes: Negative.   Respiratory: Negative.   Cardiovascular: Negative.   Gastrointestinal: Positive for heartburn, nausea (both nausea and vomiting occured on Monday 05/21/13), vomiting, abdominal pain (more on monday , just sore and uncomfortable now.), constipation (some with pain meds he got from ER and PCP, he quit taking them.) and blood in stool (He has radiation proctitis and has issue with this.  he stopped Asprin for this reason.). Negative for diarrhea.  Genitourinary: Positive for urgency.       Darker color.  Musculoskeletal: Positive for joint pain (some ongoing knee issues even with surgery).  Skin: Negative.   Neurological: Negative.  Negative for weakness.  Endo/Heme/Allergies: Negative.   Psychiatric/Behavioral: Negative.     Blood pressure 138/56, pulse 93, temperature 99.2 F (37.3 C), temperature source Oral, resp. rate 20, SpO2 95.00%. Physical Exam  Constitutional: He is oriented to person, place, and time. He appears well-developed and well-nourished. No distress.  HENT:  Head: Normocephalic and atraumatic.  Nose: Nose normal.  Eyes: Conjunctivae and EOM are normal. Pupils are equal, round, and reactive to light. Right eye exhibits no discharge. Left eye exhibits no discharge. No scleral  icterus.  Neck: Normal range of motion. Neck supple. No JVD present. No tracheal deviation present. No thyromegaly present.  Cardiovascular: Normal rate, regular rhythm, normal heart sounds and intact distal pulses.  Exam reveals no gallop.   No murmur heard. Respiratory: Effort normal and breath sounds normal. No respiratory distress. He has no wheezes. He has no rales. He exhibits no tenderness.  GI: Soft. Bowel sounds are normal. He exhibits no distension and no mass. There is tenderness (ruq with palpation). There is no rebound and no guarding.  Musculoskeletal: He exhibits no edema and no tenderness.  Lymphadenopathy:    He has no cervical adenopathy.  Neurological: He is alert and oriented to person, place, and time. No cranial nerve deficit.  Skin: Skin is warm and dry. No rash noted. He is not diaphoretic. No erythema. No pallor.  Psychiatric: He has a normal mood and affect. His behavior is normal. Judgment and thought content normal.     Assessment/Plan 1.  Acute cholecystitis with cholelithiasis 2.  Prostate CA with radiation proctitis 3.  Hx of TIA 4. Nephrolithiasis 5. Sinusitis  Plan:  Hydrate, IV antibiotics, admit,  surgery today.     Desiderio Dolata 05/24/2013, 2:04 PM

## 2013-05-24 NOTE — ED Notes (Addendum)
Pt reports after being seen by PCP and having a [place] of his abdomen completed. US abdomen report findings concerning for acute cholecystitis. Pt states he was sent her for surgery, however no pre-admission information was sent from PCP, so patient has presented to ED for surgerical consultation. Pt reports N/V that began on Sunday night, which he reports he presented to [facility] ED for evaluation. Pt reports following up with PCP and had a fever on presentation and scheduled pt for [place].

## 2013-05-24 NOTE — H&P (Signed)
Patient interviewed and examined, imaging review, agree with PA note above. Plan to proceed with laparoscopic cholecystectomy urgently today.I discussed the procedure in detail.   We discussed the risks and benefits of a laparoscopic cholecystectomy and possible cholangiogram including, but not limited to bleeding, infection, injury to surrounding structures such as the intestine or liver, bile leak, retained gallstones, need to convert to an open procedure, prolonged diarrhea, blood clots such as  DVT, common bile duct injury, anesthesia risks, and possible need for additional procedures.  The likelihood of improvement in symptoms and return to the patient's normal status is good. We discussed the typical post-operative recovery course.  Mariella Saa MD, FACS  05/24/2013 4:06 PM

## 2013-05-24 NOTE — Preoperative (Signed)
Beta Blockers   Reason not to administer Beta Blockers:Not Applicable 

## 2013-05-24 NOTE — Op Note (Signed)
Preoperative Diagnosis: Acute cholecystitis [575.0]  Postoprative Diagnosis: Acute cholecystitis [575.0]  Procedure: Procedure(s): LAPAROSCOPIC CHOLECYSTECTOMY   Surgeon: Glenna Fellows T   Assistants: none  Anesthesia:  General endotracheal anesthesia  Indications: patient is a 67 year old male who presents with several days of persistent epigastric pain and nausea. Gallbladder ultrasound was obtained in the emergency room today showing cholelithiasis and thickened gallbladder wall consistent with acute cholecystitis. LFTs and common bile duct were normal. He is tender in the right upper quadrant and has elevated white count. I recommended proceeding with urgent laparoscopic cholecystectomy with cholangiogram. We discussed the indications for the procedure and risks detailed extensively elsewhere.  Procedure Detail:  Patient was brought to the operating room, placed in the supine position on the operating table, and general endotracheal anesthesia induced. He received preoperative IV antibiotics. PAS were in place. The abdomen was widely sterilely prepped and draped. The patient time out was performed and the procedure verified. Access was obtained through one half centimeter incision at the umbilicus with an open Hassan technique mattress suture of 0 Vicryl without difficulty and pneumoperitoneum established. Under direct vision an 11 mm trocar was placed subxiphoid and 25 mm trochars along the right subcostal margin. There were omental adhesions up to the gallbladder which were inflammatory and these were peeled back and the gallbladder was severely acutely inflamed and erythematous. It was tensely distended and I decompressed the gallbladder with an aspiration needle obtaining clear bile. The fundus was unable to be grasped and elevated up to the liver and the infundibulum grasped and retracted inferolaterally. Inflamed fibrofatty tissue was stripped off the neck of the gallbladder toward the  porta hepatus. Standard rail the gallbladder wall the dissection proceeded well and I dissected down to the cystic duct with the gallbladder tapering into this and dissect the cystic duct gallbladder junction 360 dissected the cystic duct out over about a centimeter. Close triangle was carefully bluntly dissected and the cystic artery seen coursing up on the gallbladder wall and I obtained a good critical view. At this point the gallbladder was clipped at its junction with the cystic duct. The cystic duct was opened for the cholangiogram but was of poor quality with edema and I was able to place a Cholangiocath but the saline reflux through the clip and I felt that any further attempts at cholangiogram might compromise our closure the cystic duct and since the patient had normal LFTs and noted large common bile duct and the anatomy appeared clear I abandon the cholangiogram and the cystic duct was triply clipped proximally and divided. The cystic artery was further dissected free of the gallbladder wall was divided between 2 proximal one distal clip. The gallbladder was then dissected free from its bed using hook cautery. It was markedly thickened and inflamed with early gangrenous changes. The gallbladder was removed from the liver bed and placed in an Endo Catch bag and brought out through the helical incision. Hemostasis was obtained in the gallbladder bed with cautery. The abdomen was thoroughly irrigated. A Surgicel pack was placed in the gallbladder bed. A 19 Blake closed suction drain was placed in the gallbladder bed and brought out through the lateral trocar site. There was no evidence of bleeding or visceral injury at all CO2 was evacuated trochars removed. The mattress suture was secured at the umbilicus. Skin incisions were closed with subcutaneous 2-0 Monocryl and Dermabond. Sponge needle and instrument counts were correct.    Findings: Severe acute cholecystitis  Estimated Blood Loss:  less  than 100 mL         Drains: 19 Blake drain in subhepatic space  Blood Given: none          Specimens: gallbladder and contents        Complications:  * No complications entered in OR log *         Disposition: PACU - hemodynamically stable.         Condition: stable

## 2013-05-25 ENCOUNTER — Encounter (HOSPITAL_COMMUNITY): Payer: Self-pay | Admitting: General Surgery

## 2013-05-25 LAB — COMPREHENSIVE METABOLIC PANEL
Albumin: 2.7 g/dL — ABNORMAL LOW (ref 3.5–5.2)
BUN: 10 mg/dL (ref 6–23)
CO2: 26 mEq/L (ref 19–32)
Calcium: 8.1 mg/dL — ABNORMAL LOW (ref 8.4–10.5)
Chloride: 103 mEq/L (ref 96–112)
Creatinine, Ser: 0.96 mg/dL (ref 0.50–1.35)
GFR calc non Af Amer: 83 mL/min — ABNORMAL LOW (ref >90)
Glucose, Bld: 130 mg/dL — ABNORMAL HIGH (ref 70–99)
Total Bilirubin: 0.7 mg/dL (ref 0.3–1.2)

## 2013-05-25 LAB — CBC
Platelets: 179 10*3/uL (ref 150–400)
RBC: 3.97 MIL/uL — ABNORMAL LOW (ref 4.22–5.81)
RDW: 13.8 % (ref 11.5–15.5)
WBC: 8.6 10*3/uL (ref 4.0–10.5)

## 2013-05-25 MED ORDER — ZOLPIDEM TARTRATE 5 MG PO TABS: 5.0000 mg | ORAL_TABLET | Freq: Every evening | ORAL | Status: DC | PRN

## 2013-05-25 NOTE — Progress Notes (Signed)
Patient interviewed and examined, agree with PA note above. Doing well today without apparent complication. Abdomen is benign. Anticipate discharge tomorrow and pull the drain if there is no bile. I would give him oral antibiotics to complete a one-week course.  Mariella Saa MD, FACS  05/25/2013 6:04 PM

## 2013-05-25 NOTE — Progress Notes (Signed)
Spoke with Will, PA with surgery and made him aware of the pts temp. No new orders given. Will continue to monitor.

## 2013-05-25 NOTE — Progress Notes (Signed)
1 Day Post-Op  Subjective: Sore complaining it was to noisy to sleep, did well with full liquids  Objective: Vital signs in last 24 hours: Temp:  [98 F (36.7 C)-100 F (37.8 C)] 99.4 F (37.4 C) (12/12 0641) Pulse Rate:  [64-93] 76 (12/12 0641) Resp:  [13-20] 18 (12/12 0641) BP: (119-168)/(56-82) 132/69 mmHg (12/12 0641) SpO2:  [95 %-99 %] 96 % (12/12 0641) Weight:  [81.647 kg (180 lb)] 81.647 kg (180 lb) (12/11 2003) Last BM Date: 05/24/13 960 PO 170 ml from the drain Full liquids TM 99.4 Labs OK Intake/Output from previous day: 12/11 0701 - 12/12 0700 In: 3521.7 [P.O.:960; I.V.:2561.7] Out: 1670 [Urine:1475; Drains:170; Blood:25] Intake/Output this shift:    General appearance: alert, cooperative and no distress Resp: clear to auscultation bilaterally GI: sore, drain is putting out clear serosanguinous fluid.  Taking full liquids well  Lab Results:   Recent Labs  05/24/13 1225 05/25/13 0514  WBC 11.8* 8.6  HGB 13.0 11.4*  HCT 37.3* 34.5*  PLT 196 179    BMET  Recent Labs  05/24/13 1225 05/25/13 0514  NA 136 139  K 4.0 4.0  CL 99 103  CO2 24 26  GLUCOSE 130* 130*  BUN 14 10  CREATININE 0.97 0.96  CALCIUM 9.1 8.1*   PT/INR No results found for this basename: LABPROT, INR,  in the last 72 hours   Recent Labs Lab 05/21/13 0703 05/24/13 1225 05/25/13 0514  AST 24 20 20   ALT 35 26 25  ALKPHOS 97 84 73  BILITOT 0.3 0.7 0.7  PROT 7.4 7.2 6.0  ALBUMIN 4.2 3.5 2.7*     Lipase     Component Value Date/Time   LIPASE 26 05/24/2013 1225     Studies/Results: US Abdomen Complete  05/24/2013   ADDENDUM REPORT: 05/24/2013 09:01  ADDENDUM: IMPRESSION: 1. Gallbladder sludge, stones and gallbladder wall thickening. Findings are concerning for acute cholecystitis. These results will be called to the ordering clinician or representative by the Radiologist Assistant, and communication documented in the PACS Dashboard   Electronically Signed   By: Signa Kell M.D.   On: 05/24/2013 09:01   05/24/2013   CLINICAL DATA:  Abdominal pain and fever.  EXAM: ULTRASOUND ABDOMEN COMPLETE  COMPARISON:  09/01/2012  FINDINGS: Gallbladder:  Sludge and small stones are identified within the gallbladder. The gallbladder wall is thickened measuring 7.4 mm.  Common bile duct:  Diameter: 4.5 mm.  Liver:  No focal lesion identified. Within normal limits in parenchymal echogenicity.  IVC:  No abnormality visualized.  Pancreas:  Visualized portion unremarkable.  Spleen:  Size and appearance within normal limits.  Right Kidney:  Length: 11.7 cm. Echogenicity within normal limits. No mass or hydronephrosis visualized.  Left Kidney:  Length: 11.4 cm. Echogenicity within normal limits. No mass or hydronephrosis visualized.  Abdominal aorta:  No aneurysm visualized.  Other findings:  None.  IMPRESSION: 1. Sludge stress sec gallbladder sludge, stones and gallbladder wall thickening. Findings are concerning for acute cholecystitis. These results will be called to the ordering clinician or representative by the Radiologist Assistant, and communication documented in the PACS Dashboard.  Electronically Signed: By: Signa Kell M.D. On: 05/24/2013 08:41    Medications: . ampicillin-sulbactam (UNASYN) IV  3 g Intravenous Q6H  . heparin subcutaneous  5,000 Units Subcutaneous Q8H    Assessment/Plan 1. Acute cholecystitis with cholelithiasis  2. Prostate CA with radiation proctitis  3. Hx of TIA  4. Nephrolithiasis  5. Sinusitis   Plan:  Advance to low fat, mobilize, continue IV antibiotics for now.  I will leave him something for sleep. Discuss drain and anitbiotic duration with Dr. Johna Sheriff.  LOS: 1 day    Joseph Marsh 05/25/2013

## 2013-05-25 NOTE — Care Management Note (Signed)
    Page 1 of 1   05/25/2013     10:17:51 AM   CARE MANAGEMENT NOTE 05/25/2013  Patient:  Joseph Marsh,Joseph Marsh   Account Number:  [id number]  Date Initiated:  05/25/2013  Documentation initiated by:  Lorenda Ishihara  Subjective/Objective Assessment:   68 yo male admitted with acute cholecystitis, s/p lap chole. PTA lived at home with spouse.     Action/Plan:   Home when stable   Anticipated DC Date:  05/28/2013   Anticipated DC Plan:  HOME/SELF CARE      DC Planning Services  CM consult      Choice offered to / List presented to:             Status of service:  Completed, signed off Medicare Important Message given?   (If response is "NO", the following Medicare IM given date fields will be blank) Date Medicare IM given:   Date Additional Medicare IM given:    Discharge Disposition:  HOME/SELF CARE  Per UR Regulation:  Reviewed for med. necessity/level of care/duration of stay  If discussed at Long Length of Stay Meetings, dates discussed:    Comments:

## 2013-05-26 MED ORDER — AMOXICILLIN-POT CLAVULANATE 875-125 MG PO TABS: 1.0000 | ORAL_TABLET | Freq: Two times a day (BID) | ORAL | Status: DC

## 2013-05-26 MED ORDER — AMOXICILLIN-POT CLAVULANATE 875-125 MG PO TABS
1.0000 | ORAL_TABLET | Freq: Two times a day (BID) | ORAL | Status: DC
  Administered 2013-05-26: 1 via ORAL
  Filled 2013-05-26 (×2): qty 1

## 2013-05-26 NOTE — Discharge Summary (Signed)
Physician Discharge Summary  Patient ID: Joseph Marsh MRN: [id number] DOB/AGE: [birth year] 67 y.o.  Admit date: 05/24/2013 Discharge date: 05/26/2013  Admission Diagnoses: Acute calculous cholecystitis  Discharge Diagnoses:  Active Problems:   Cholelithiasis with acute cholecystitis   Acute calculous cholecystitis   Discharged Condition: good  Hospital Course: Patient was admitted for cholecystitis.  He underwent laparoscopic resection that day.  Over the next 2 days, his diet was advanced and he was transitioned to oral medications.  By POD 2 the patient was in stable condition for d/c home.   Consults: None  Significant Diagnostic Studies: labs: cbc, chemistry  Treatments: IV hydration, antibiotics: Unasyn, analgesia: acetaminophen w/ codeine and surgery: lap cholecystectomy  Discharge Exam: Blood pressure 121/68, pulse 80, temperature 98.7 F (37.1 C), temperature source Oral, resp. rate 18, height 5\' 10"  (1.778 m), weight 180 lb (81.647 kg), SpO2 91.00%. General appearance: alert and cooperative GI: normal findings: soft, non-tender JP: clear, serous fluid Incision/Wound: clean, dry, intact  Disposition: 01-Home or Self Care     Medication List         acetaminophen 500 MG tablet  Commonly known as:  TYLENOL  Take 1,000 mg by mouth every 6 (six) hours as needed.     amoxicillin-clavulanate 875-125 MG per tablet  Commonly known as:  AUGMENTIN  Take 1 tablet by mouth every 12 (twelve) hours.     [facility] MELATONIN 3 MG Tabs  Generic drug:  Melatonin  Take 1 tablet by mouth daily.     GLUCOSAMINE CHONDROIT-COLLAGEN PO  Take 1 capsule by mouth daily.     HYDROcodone-acetaminophen 5-325 MG per tablet  Commonly known as:  NORCO/VICODIN  Take 1-2 tablets by mouth every 4 (four) hours as needed.     multivitamin tablet  Take 1 tablet by mouth daily.     pantoprazole 20 MG tablet  Commonly known as:  PROTONIX  Take 1 tablet (20 mg total) by mouth daily.      TYLENOL PM EXTRA STRENGTH PO  Take 1 tablet by mouth at bedtime as needed (for pain/sleep).     VITAMIN D-3 PO  Take 1 tablet by mouth daily.           Follow-up Information   Follow up with HOXWORTH,BENJAMIN T, MD. Schedule an appointment as soon as possible for a visit in 10 days.   Specialty:  General Surgery   Contact information:   [address] [place] [address] [phone]       Signed: Vanita Panda 05/26/2013, 8:45 AM

## 2013-06-01 ENCOUNTER — Ambulatory Visit (INDEPENDENT_AMBULATORY_CARE_PROVIDER_SITE_OTHER): Payer: Medicare Other | Admitting: General Surgery

## 2013-06-01 ENCOUNTER — Encounter (INDEPENDENT_AMBULATORY_CARE_PROVIDER_SITE_OTHER): Payer: Self-pay | Admitting: General Surgery

## 2013-06-01 VITALS — BP 110/68 | HR 71 | Temp 97.3°F | Resp 16 | Ht 70.0 in | Wt 181.0 lb

## 2013-06-01 DIAGNOSIS — Z09 Encounter for follow-up examination after completed treatment for conditions other than malignant neoplasm: Secondary | ICD-10-CM

## 2013-06-01 NOTE — Patient Instructions (Signed)
Can use Imodium as needed for diarrhea. For skin irritation cleaned with Tucks or similar medicated pads and can apply Desitin ( zinc oxide ointment) to protect the skin

## 2013-06-01 NOTE — Progress Notes (Signed)
History: Patient returns approximately 2 weeks following emergency laparoscopic cholecystectomy for severe cholecystitis. He overall is getting along pretty well although he has a few issues. He has had a dry cough since surgery. This seems to be getting better. He went home on antibiotics which he is just completing. He states he had some chills for a couple of nights but these have now resolved. No definite fever. He has had some diarrhea which has caused irritation around his anus. No significant pain. He feels somewhat fatigued but is back at work full time.  Exam: BP 110/68  Pulse 71  Temp(Src) 97.3 F (36.3 C) (Temporal)  Resp 16  Ht 5\' 10"  (1.778 m)  Wt 181 lb (82.101 kg)  BMI 25.97 kg/m2 General: Appears well Abdomen: Wounds well healed. Soft and nontender. Rectal: There is some mild perianal skin irritation but no hemorrhoids or other abnormalities  Assessment and plan: Doing well following emergency cholecystectomy. His symptoms are all somewhat expected temporary side effects and all seem to be improving. I made some recommendations regarding some skin treatments for his perianal irritation. He will call as needed if he does not continue to feel better

## 2013-06-04 NOTE — Anesthesia Postprocedure Evaluation (Signed)
  Anesthesia Post-op Note  Patient: Joseph Marsh  Procedure(s) Performed: Procedure(s) (LRB): LAPAROSCOPIC CHOLECYSTECTOMY (N/A)  Patient Location: PACU  Anesthesia Type: General  Level of Consciousness: awake and alert   Airway and Oxygen Therapy: Patient Spontanous Breathing  Post-op Pain: mild  Post-op Assessment: Post-op Vital signs reviewed, Patient's Cardiovascular Status Stable, Respiratory Function Stable, Patent Airway and No signs of Nausea or vomiting  Last Vitals:  Filed Vitals:   05/26/13 0545  BP: 121/68  Pulse: 80  Temp: 37.1 C  Resp: 18    Post-op Vital Signs: stable   Complications: No apparent anesthesia complications

## 2013-06-05 ENCOUNTER — Telehealth (INDEPENDENT_AMBULATORY_CARE_PROVIDER_SITE_OTHER): Payer: Self-pay

## 2013-06-05 NOTE — Telephone Encounter (Signed)
Pt calling in b/c he still is having issues after having his gallbladder out a few weeks a go. The pt feels that his mouth and tongue have a coating on it but no bumps or discoloration. The pt can not taste anything but feels there is something wrong with his mouth. Please advise.

## 2013-06-06 NOTE — Telephone Encounter (Signed)
I honestly have no idea. Possibly an antibiotic affect. Please call in a prescription for Magic mouthwash to use for 1 week.

## 2013-06-08 DIAGNOSIS — J40 Bronchitis, not specified as acute or chronic: Secondary | ICD-10-CM | POA: Diagnosis not present

## 2013-06-11 NOTE — Telephone Encounter (Signed)
Called in Rx for magic mouthwash i tsp QID for one week.  Informed the patient of this.  He was agreable with this plan.  Informed him to call us if things do not start getting better within a couple of days.

## 2013-07-13 DIAGNOSIS — C61 Malignant neoplasm of prostate: Secondary | ICD-10-CM | POA: Diagnosis not present

## 2013-07-20 DIAGNOSIS — C61 Malignant neoplasm of prostate: Secondary | ICD-10-CM | POA: Diagnosis not present

## 2013-07-20 DIAGNOSIS — N400 Enlarged prostate without lower urinary tract symptoms: Secondary | ICD-10-CM | POA: Diagnosis not present

## 2013-07-20 DIAGNOSIS — N529 Male erectile dysfunction, unspecified: Secondary | ICD-10-CM | POA: Diagnosis not present

## 2013-08-16 DIAGNOSIS — H2510 Age-related nuclear cataract, unspecified eye: Secondary | ICD-10-CM | POA: Diagnosis not present

## 2013-08-16 DIAGNOSIS — H40019 Open angle with borderline findings, low risk, unspecified eye: Secondary | ICD-10-CM | POA: Diagnosis not present

## 2013-08-16 DIAGNOSIS — H43819 Vitreous degeneration, unspecified eye: Secondary | ICD-10-CM | POA: Diagnosis not present

## 2013-08-16 DIAGNOSIS — H25019 Cortical age-related cataract, unspecified eye: Secondary | ICD-10-CM | POA: Diagnosis not present

## 2013-10-18 DIAGNOSIS — R7309 Other abnormal glucose: Secondary | ICD-10-CM | POA: Diagnosis not present

## 2013-10-18 DIAGNOSIS — E7800 Pure hypercholesterolemia, unspecified: Secondary | ICD-10-CM | POA: Diagnosis not present

## 2014-01-21 ENCOUNTER — Other Ambulatory Visit: Payer: Self-pay | Admitting: Dermatology

## 2014-01-21 DIAGNOSIS — L819 Disorder of pigmentation, unspecified: Secondary | ICD-10-CM | POA: Diagnosis not present

## 2014-01-21 DIAGNOSIS — L851 Acquired keratosis [keratoderma] palmaris et plantaris: Secondary | ICD-10-CM | POA: Diagnosis not present

## 2014-01-21 DIAGNOSIS — L259 Unspecified contact dermatitis, unspecified cause: Secondary | ICD-10-CM | POA: Diagnosis not present

## 2014-01-21 DIAGNOSIS — T148 Other injury of unspecified body region: Secondary | ICD-10-CM | POA: Diagnosis not present

## 2014-01-21 DIAGNOSIS — L821 Other seborrheic keratosis: Secondary | ICD-10-CM | POA: Diagnosis not present

## 2014-01-21 DIAGNOSIS — W57XXXA Bitten or stung by nonvenomous insect and other nonvenomous arthropods, initial encounter: Secondary | ICD-10-CM | POA: Diagnosis not present

## 2014-01-21 DIAGNOSIS — D485 Neoplasm of uncertain behavior of skin: Secondary | ICD-10-CM | POA: Diagnosis not present

## 2014-01-21 DIAGNOSIS — D239 Other benign neoplasm of skin, unspecified: Secondary | ICD-10-CM | POA: Diagnosis not present

## 2014-01-21 DIAGNOSIS — L570 Actinic keratosis: Secondary | ICD-10-CM | POA: Diagnosis not present

## 2014-01-21 DIAGNOSIS — L439 Lichen planus, unspecified: Secondary | ICD-10-CM | POA: Diagnosis not present

## 2014-01-21 DIAGNOSIS — D1739 Benign lipomatous neoplasm of skin and subcutaneous tissue of other sites: Secondary | ICD-10-CM | POA: Diagnosis not present

## 2014-02-01 DIAGNOSIS — C61 Malignant neoplasm of prostate: Secondary | ICD-10-CM | POA: Diagnosis not present

## 2014-02-02 ENCOUNTER — Encounter: Payer: Self-pay | Admitting: Gastroenterology

## 2014-02-06 DIAGNOSIS — C61 Malignant neoplasm of prostate: Secondary | ICD-10-CM | POA: Diagnosis not present

## 2014-02-06 DIAGNOSIS — N400 Enlarged prostate without lower urinary tract symptoms: Secondary | ICD-10-CM | POA: Diagnosis not present

## 2014-05-26 DIAGNOSIS — J209 Acute bronchitis, unspecified: Secondary | ICD-10-CM | POA: Diagnosis not present

## 2014-05-29 DIAGNOSIS — R7309 Other abnormal glucose: Secondary | ICD-10-CM | POA: Diagnosis not present

## 2014-05-29 DIAGNOSIS — E780 Pure hypercholesterolemia: Secondary | ICD-10-CM | POA: Diagnosis not present

## 2014-06-10 DIAGNOSIS — R05 Cough: Secondary | ICD-10-CM | POA: Diagnosis not present

## 2014-06-10 DIAGNOSIS — R509 Fever, unspecified: Secondary | ICD-10-CM | POA: Diagnosis not present

## 2014-06-10 DIAGNOSIS — R300 Dysuria: Secondary | ICD-10-CM | POA: Diagnosis not present

## 2014-06-11 ENCOUNTER — Other Ambulatory Visit: Payer: Self-pay | Admitting: Family Medicine

## 2014-06-11 ENCOUNTER — Ambulatory Visit
Admission: RE | Admit: 2014-06-11 | Discharge: 2014-06-11 | Disposition: A | Discharge status: Home / Self Care | Payer: Medicare Other | Source: Ambulatory Visit | Attending: Family Medicine | Admitting: Family Medicine

## 2014-06-11 DIAGNOSIS — R05 Cough: Secondary | ICD-10-CM

## 2014-06-11 DIAGNOSIS — J189 Pneumonia, unspecified organism: Secondary | ICD-10-CM | POA: Diagnosis not present

## 2014-06-11 DIAGNOSIS — R059 Cough, unspecified: Secondary | ICD-10-CM

## 2014-06-24 DIAGNOSIS — Z1389 Encounter for screening for other disorder: Secondary | ICD-10-CM | POA: Diagnosis not present

## 2014-06-24 DIAGNOSIS — Z8546 Personal history of malignant neoplasm of prostate: Secondary | ICD-10-CM | POA: Diagnosis not present

## 2014-06-24 DIAGNOSIS — J189 Pneumonia, unspecified organism: Secondary | ICD-10-CM | POA: Diagnosis not present

## 2014-06-24 DIAGNOSIS — R7309 Other abnormal glucose: Secondary | ICD-10-CM | POA: Diagnosis not present

## 2014-06-24 DIAGNOSIS — Z23 Encounter for immunization: Secondary | ICD-10-CM | POA: Diagnosis not present

## 2014-06-24 DIAGNOSIS — Z0001 Encounter for general adult medical examination with abnormal findings: Secondary | ICD-10-CM | POA: Diagnosis not present

## 2014-06-24 DIAGNOSIS — K210 Gastro-esophageal reflux disease with esophagitis: Secondary | ICD-10-CM | POA: Diagnosis not present

## 2014-07-19 ENCOUNTER — Other Ambulatory Visit: Payer: Self-pay | Admitting: *Deleted

## 2014-07-23 DIAGNOSIS — Z23 Encounter for immunization: Secondary | ICD-10-CM | POA: Diagnosis not present

## 2014-07-31 DIAGNOSIS — J329 Chronic sinusitis, unspecified: Secondary | ICD-10-CM | POA: Diagnosis not present

## 2014-08-12 DIAGNOSIS — C61 Malignant neoplasm of prostate: Secondary | ICD-10-CM | POA: Diagnosis not present

## 2014-08-16 DIAGNOSIS — R3916 Straining to void: Secondary | ICD-10-CM | POA: Diagnosis not present

## 2014-08-16 DIAGNOSIS — N5201 Erectile dysfunction due to arterial insufficiency: Secondary | ICD-10-CM | POA: Diagnosis not present

## 2014-08-16 DIAGNOSIS — N401 Enlarged prostate with lower urinary tract symptoms: Secondary | ICD-10-CM | POA: Diagnosis not present

## 2014-08-16 DIAGNOSIS — C61 Malignant neoplasm of prostate: Secondary | ICD-10-CM | POA: Diagnosis not present

## 2014-08-19 DIAGNOSIS — H40019 Open angle with borderline findings, low risk, unspecified eye: Secondary | ICD-10-CM | POA: Diagnosis not present

## 2014-08-19 DIAGNOSIS — H2513 Age-related nuclear cataract, bilateral: Secondary | ICD-10-CM | POA: Diagnosis not present

## 2014-08-19 DIAGNOSIS — H25013 Cortical age-related cataract, bilateral: Secondary | ICD-10-CM | POA: Diagnosis not present

## 2014-08-19 DIAGNOSIS — H02833 Dermatochalasis of right eye, unspecified eyelid: Secondary | ICD-10-CM | POA: Diagnosis not present

## 2014-09-04 DIAGNOSIS — F4323 Adjustment disorder with mixed anxiety and depressed mood: Secondary | ICD-10-CM | POA: Diagnosis not present

## 2014-09-30 DIAGNOSIS — F4323 Adjustment disorder with mixed anxiety and depressed mood: Secondary | ICD-10-CM | POA: Diagnosis not present

## 2014-11-16 DIAGNOSIS — H35033 Hypertensive retinopathy, bilateral: Secondary | ICD-10-CM | POA: Diagnosis not present

## 2014-11-16 DIAGNOSIS — H35372 Puckering of macula, left eye: Secondary | ICD-10-CM | POA: Diagnosis not present

## 2014-11-16 DIAGNOSIS — H40019 Open angle with borderline findings, low risk, unspecified eye: Secondary | ICD-10-CM | POA: Diagnosis not present

## 2014-11-16 DIAGNOSIS — H2513 Age-related nuclear cataract, bilateral: Secondary | ICD-10-CM | POA: Diagnosis not present

## 2014-11-16 DIAGNOSIS — I7090 Unspecified atherosclerosis: Secondary | ICD-10-CM | POA: Diagnosis not present

## 2014-12-09 ENCOUNTER — Other Ambulatory Visit: Payer: Self-pay

## 2014-12-23 DIAGNOSIS — E780 Pure hypercholesterolemia: Secondary | ICD-10-CM | POA: Diagnosis not present

## 2014-12-23 DIAGNOSIS — R7309 Other abnormal glucose: Secondary | ICD-10-CM | POA: Diagnosis not present

## 2015-01-01 DIAGNOSIS — F430 Acute stress reaction: Secondary | ICD-10-CM | POA: Diagnosis not present

## 2015-01-01 DIAGNOSIS — E780 Pure hypercholesterolemia: Secondary | ICD-10-CM | POA: Diagnosis not present

## 2015-01-01 DIAGNOSIS — R7309 Other abnormal glucose: Secondary | ICD-10-CM | POA: Diagnosis not present

## 2015-01-24 DIAGNOSIS — L738 Other specified follicular disorders: Secondary | ICD-10-CM | POA: Diagnosis not present

## 2015-01-24 DIAGNOSIS — L918 Other hypertrophic disorders of the skin: Secondary | ICD-10-CM | POA: Diagnosis not present

## 2015-01-24 DIAGNOSIS — D1801 Hemangioma of skin and subcutaneous tissue: Secondary | ICD-10-CM | POA: Diagnosis not present

## 2015-01-24 DIAGNOSIS — D224 Melanocytic nevi of scalp and neck: Secondary | ICD-10-CM | POA: Diagnosis not present

## 2015-01-24 DIAGNOSIS — L2089 Other atopic dermatitis: Secondary | ICD-10-CM | POA: Diagnosis not present

## 2015-02-20 DIAGNOSIS — H40019 Open angle with borderline findings, low risk, unspecified eye: Secondary | ICD-10-CM | POA: Diagnosis not present

## 2015-03-11 DIAGNOSIS — C61 Malignant neoplasm of prostate: Secondary | ICD-10-CM | POA: Diagnosis not present

## 2015-03-21 DIAGNOSIS — N5201 Erectile dysfunction due to arterial insufficiency: Secondary | ICD-10-CM | POA: Diagnosis not present

## 2015-03-21 DIAGNOSIS — N401 Enlarged prostate with lower urinary tract symptoms: Secondary | ICD-10-CM | POA: Diagnosis not present

## 2015-03-21 DIAGNOSIS — Z8546 Personal history of malignant neoplasm of prostate: Secondary | ICD-10-CM | POA: Diagnosis not present

## 2015-03-21 DIAGNOSIS — R3916 Straining to void: Secondary | ICD-10-CM | POA: Diagnosis not present

## 2015-06-18 DIAGNOSIS — M79643 Pain in unspecified hand: Secondary | ICD-10-CM | POA: Diagnosis not present

## 2015-06-18 DIAGNOSIS — M5136 Other intervertebral disc degeneration, lumbar region: Secondary | ICD-10-CM | POA: Diagnosis not present

## 2015-06-19 DIAGNOSIS — Z23 Encounter for immunization: Secondary | ICD-10-CM | POA: Diagnosis not present

## 2015-06-25 DIAGNOSIS — R7309 Other abnormal glucose: Secondary | ICD-10-CM | POA: Diagnosis not present

## 2015-06-25 DIAGNOSIS — E7800 Pure hypercholesterolemia, unspecified: Secondary | ICD-10-CM | POA: Diagnosis not present

## 2015-06-27 DIAGNOSIS — M5136 Other intervertebral disc degeneration, lumbar region: Secondary | ICD-10-CM | POA: Diagnosis not present

## 2015-06-30 DIAGNOSIS — Z8546 Personal history of malignant neoplasm of prostate: Secondary | ICD-10-CM | POA: Diagnosis not present

## 2015-06-30 DIAGNOSIS — R7303 Prediabetes: Secondary | ICD-10-CM | POA: Diagnosis not present

## 2015-06-30 DIAGNOSIS — Z0000 Encounter for general adult medical examination without abnormal findings: Secondary | ICD-10-CM | POA: Diagnosis not present

## 2015-06-30 DIAGNOSIS — E7800 Pure hypercholesterolemia, unspecified: Secondary | ICD-10-CM | POA: Diagnosis not present

## 2015-07-04 DIAGNOSIS — M4807 Spinal stenosis, lumbosacral region: Secondary | ICD-10-CM | POA: Diagnosis not present

## 2015-07-04 DIAGNOSIS — M5136 Other intervertebral disc degeneration, lumbar region: Secondary | ICD-10-CM | POA: Diagnosis not present

## 2015-07-11 DIAGNOSIS — L820 Inflamed seborrheic keratosis: Secondary | ICD-10-CM | POA: Diagnosis not present

## 2015-07-11 DIAGNOSIS — L821 Other seborrheic keratosis: Secondary | ICD-10-CM | POA: Diagnosis not present

## 2015-07-15 DIAGNOSIS — M19042 Primary osteoarthritis, left hand: Secondary | ICD-10-CM | POA: Diagnosis not present

## 2015-07-17 DIAGNOSIS — M4807 Spinal stenosis, lumbosacral region: Secondary | ICD-10-CM | POA: Diagnosis not present

## 2015-07-28 ENCOUNTER — Emergency Department (HOSPITAL_COMMUNITY)
Admission: EM | Admit: 2015-07-28 | Discharge: 2015-07-28 | Disposition: A | Discharge status: Home / Self Care | Payer: Medicare Other | Attending: Emergency Medicine | Admitting: Emergency Medicine

## 2015-07-28 ENCOUNTER — Encounter (HOSPITAL_COMMUNITY): Payer: Self-pay | Admitting: Emergency Medicine

## 2015-07-28 DIAGNOSIS — Z87442 Personal history of urinary calculi: Secondary | ICD-10-CM | POA: Insufficient documentation

## 2015-07-28 DIAGNOSIS — Z79899 Other long term (current) drug therapy: Secondary | ICD-10-CM | POA: Insufficient documentation

## 2015-07-28 DIAGNOSIS — R519 Headache, unspecified: Secondary | ICD-10-CM

## 2015-07-28 DIAGNOSIS — R220 Localized swelling, mass and lump, head: Secondary | ICD-10-CM | POA: Diagnosis not present

## 2015-07-28 DIAGNOSIS — R51 Headache: Secondary | ICD-10-CM | POA: Insufficient documentation

## 2015-07-28 DIAGNOSIS — Z8546 Personal history of malignant neoplasm of prostate: Secondary | ICD-10-CM | POA: Insufficient documentation

## 2015-07-28 NOTE — Discharge Instructions (Signed)
Your exam today reveals mild swelling and redness above your left ear.  This may be the start on an early cellulitis (skin infection).  It is unlikely to be temporal arteritis as you are not exhibiting headache, visual disturbance, or unexplained fever.  Continue to monitor the area, and follow-up with your primary care provider or return to the ED if the swelling persists or worsens, or you develop any other concerning symptoms.

## 2015-07-28 NOTE — ED Provider Notes (Signed)
CSN: [id number]     Arrival date & time 07/28/15  2248 History  By signing my name below, I, Starleen Arms, attest that this documentation has been prepared under the direction and in the presence of Etta Quill, NP. Electronically Signed: Starleen Arms ED Scribe. 07/28/2015. 11:05 PM.    No chief complaint on file.  The history is provided by the patient. No language interpreter was used.    HPI Comments: Joseph Marsh is a 70 y.o. male who presents to the Emergency Department complaining of a small area of swelling above the left ear first observed PTA.  The patient reports the complaint is only painful to touch.  He denies injury to the area or previous episodes of the same.  He states he was motivated to come to the ED today at the suggestion of a nursing hotline Patient reports hx of spinal stenosis and denies hx of DM, HTN, or other chronic conditions.  He denies headache, blurred vision.    PCP: Marjorie Smolder, MD  Past Medical History  Diagnosis Date  . Kidney stone   . Prostate cancer ([place])   . Sinusitis    Past Surgical History  Procedure Laterality Date  . Lithotripsy    . Tonsillectomy    . Knee surgery    . Cholecystectomy N/A 05/24/2013    Procedure: LAPAROSCOPIC CHOLECYSTECTOMY;  Surgeon: Edward Jolly, MD;  Location: [facility];  Service: General;  Laterality: N/A;   Family History  Problem Relation Age of Onset  . Melanoma Brother   . Heart disease Father     died from   Social History  Substance Use Topics  . Smoking status: Never Smoker   . Smokeless tobacco: Never Used  . Alcohol Use: No    Review of Systems  Constitutional: Negative for fever.  Eyes: Negative for visual disturbance.  Neurological: Negative for headaches.  All other systems reviewed and are negative.  A complete 10 system review of systems was obtained and all systems are negative except as noted in the HPI and PMH.   Allergies  Review of patient's allergies indicates no  known allergies.  Home Medications   Prior to Admission medications   Medication Sig Start Date End Date Taking? Authorizing Provider  acetaminophen (TYLENOL) 500 MG tablet Take 1,000 mg by mouth every 6 (six) hours as needed.    Historical Provider, MD  amoxicillin-clavulanate (AUGMENTIN) 875-125 MG per tablet Take 1 tablet by mouth every 12 (twelve) hours. [id number]   Leighton Ruff, MD  Cholecalciferol (VITAMIN D-3 PO) Take 1 tablet by mouth daily.    Historical Provider, MD  Diphenhydramine-APAP, sleep, (TYLENOL PM EXTRA STRENGTH PO) Take 1 tablet by mouth at bedtime as needed (for pain/sleep).    Historical Provider, MD  Glucos-Chondroit-Collag-Hyal (GLUCOSAMINE CHONDROIT-COLLAGEN PO) Take 1 capsule by mouth daily.    Historical Provider, MD  HYDROcodone-acetaminophen (NORCO/VICODIN) 5-325 MG per tablet Take 1-2 tablets by mouth every 4 (four) hours as needed. 05/21/13   Kaitlyn Szekalski, PA-C  Melatonin ([facility] MELATONIN) 3 MG TABS Take 1 tablet by mouth daily.    Historical Provider, MD  Multiple Vitamin (MULTIVITAMIN) tablet Take 1 tablet by mouth daily.    Historical Provider, MD  pantoprazole (PROTONIX) 20 MG tablet Take 1 tablet (20 mg total) by mouth daily. 05/21/13   Kaitlyn Szekalski, PA-C   BP 137/101 mmHg  Pulse 95  Temp(Src) 98.1 F (36.7 C) (Oral)  Resp 16  SpO2 96% Physical Exam  Constitutional: He is  oriented to person, place, and time. He appears well-developed and well-nourished. No distress.  HENT:  Head: Normocephalic and atraumatic.  Swelling and tenderness above the left ear that is slightly red.    Eyes: Conjunctivae and EOM are normal.  Neck: Neck supple. No tracheal deviation present.  Cardiovascular: Normal rate.   Pulmonary/Chest: Effort normal. No respiratory distress.  Musculoskeletal: Normal range of motion.  Neurological: He is alert and oriented to person, place, and time.  Skin: Skin is warm and dry.  Psychiatric: He has a normal mood and affect. His  behavior is normal.  Nursing note and vitals reviewed.   ED Course  Procedures (including critical care time)  DIAGNOSTIC STUDIES: Oxygen Saturation is 96% on RA, normal by my interpretation.    COORDINATION OF CARE:  11:15 PM Discussed treatment plan with patient at bedside.  Patient acknowledges and agrees with plan.    Labs Review Labs Reviewed - No data to display  Imaging Review No results found. I have personally reviewed and evaluated these images and lab results as part of my medical decision-making.   EKG Interpretation None     Patient presents to ED for evaluation of swelling to scalp with minimal erythema above left ear.  He had contacted the nurse help line, who recommended patient come to the ED due to concern for temporal arteritis.  However, patient does not have a headache, visual disturbance/loss, or fever.  May be early onset of cellulitis.  Discussed patient with Dr. Betsey Holiday.  Will employ watchful waiting for now.  Patient to follow-up with PCP or return to ED if swelling persists, worsens, or he develops headache, vision changes, or fever. MDM   Final diagnoses:  None    Scalp swelling.  I personally performed the services described in this documentation, which was scribed in my presence. The recorded information has been reviewed and is accurate.    Etta Quill, NP 07/29/15 [id number]  Orpah Greek, MD 07/29/15 [phone]

## 2015-07-28 NOTE — ED Notes (Signed)
Pt. reports pain and swelling above left ear onset this evening , denies injury , no hearing loss or drainage .

## 2015-08-07 DIAGNOSIS — M4807 Spinal stenosis, lumbosacral region: Secondary | ICD-10-CM | POA: Diagnosis not present

## 2015-08-07 DIAGNOSIS — M5136 Other intervertebral disc degeneration, lumbar region: Secondary | ICD-10-CM | POA: Diagnosis not present

## 2015-08-18 DIAGNOSIS — M722 Plantar fascial fibromatosis: Secondary | ICD-10-CM | POA: Diagnosis not present

## 2015-10-07 DIAGNOSIS — H18892 Other specified disorders of cornea, left eye: Secondary | ICD-10-CM | POA: Diagnosis not present

## 2015-11-03 DIAGNOSIS — E119 Type 2 diabetes mellitus without complications: Secondary | ICD-10-CM | POA: Diagnosis not present

## 2015-11-03 DIAGNOSIS — H35032 Hypertensive retinopathy, left eye: Secondary | ICD-10-CM | POA: Diagnosis not present

## 2015-11-03 DIAGNOSIS — H2513 Age-related nuclear cataract, bilateral: Secondary | ICD-10-CM | POA: Diagnosis not present

## 2015-11-03 DIAGNOSIS — H35371 Puckering of macula, right eye: Secondary | ICD-10-CM | POA: Diagnosis not present

## 2015-11-03 DIAGNOSIS — H35031 Hypertensive retinopathy, right eye: Secondary | ICD-10-CM | POA: Diagnosis not present

## 2015-11-03 DIAGNOSIS — H25013 Cortical age-related cataract, bilateral: Secondary | ICD-10-CM | POA: Diagnosis not present

## 2015-11-03 DIAGNOSIS — H40013 Open angle with borderline findings, low risk, bilateral: Secondary | ICD-10-CM | POA: Diagnosis not present

## 2015-11-03 DIAGNOSIS — H35372 Puckering of macula, left eye: Secondary | ICD-10-CM | POA: Diagnosis not present

## 2015-11-04 DIAGNOSIS — W57XXXA Bitten or stung by nonvenomous insect and other nonvenomous arthropods, initial encounter: Secondary | ICD-10-CM | POA: Diagnosis not present

## 2015-11-04 DIAGNOSIS — S40869A Insect bite (nonvenomous) of unspecified upper arm, initial encounter: Secondary | ICD-10-CM | POA: Diagnosis not present

## 2015-11-13 DIAGNOSIS — C61 Malignant neoplasm of prostate: Secondary | ICD-10-CM | POA: Diagnosis not present

## 2015-11-19 DIAGNOSIS — Z8546 Personal history of malignant neoplasm of prostate: Secondary | ICD-10-CM | POA: Diagnosis not present

## 2016-01-09 DIAGNOSIS — R7309 Other abnormal glucose: Secondary | ICD-10-CM | POA: Diagnosis not present

## 2016-01-09 DIAGNOSIS — E7800 Pure hypercholesterolemia, unspecified: Secondary | ICD-10-CM | POA: Diagnosis not present

## 2016-01-09 DIAGNOSIS — R7303 Prediabetes: Secondary | ICD-10-CM | POA: Diagnosis not present

## 2016-01-14 DIAGNOSIS — E7800 Pure hypercholesterolemia, unspecified: Secondary | ICD-10-CM | POA: Diagnosis not present

## 2016-01-14 DIAGNOSIS — Z8546 Personal history of malignant neoplasm of prostate: Secondary | ICD-10-CM | POA: Diagnosis not present

## 2016-01-14 DIAGNOSIS — R7303 Prediabetes: Secondary | ICD-10-CM | POA: Diagnosis not present

## 2016-02-02 DIAGNOSIS — D225 Melanocytic nevi of trunk: Secondary | ICD-10-CM | POA: Diagnosis not present

## 2016-02-02 DIAGNOSIS — D1801 Hemangioma of skin and subcutaneous tissue: Secondary | ICD-10-CM | POA: Diagnosis not present

## 2016-02-02 DIAGNOSIS — L821 Other seborrheic keratosis: Secondary | ICD-10-CM | POA: Diagnosis not present

## 2016-02-02 DIAGNOSIS — L918 Other hypertrophic disorders of the skin: Secondary | ICD-10-CM | POA: Diagnosis not present

## 2016-03-25 DIAGNOSIS — M5136 Other intervertebral disc degeneration, lumbar region: Secondary | ICD-10-CM | POA: Diagnosis not present

## 2016-03-25 DIAGNOSIS — M4807 Spinal stenosis, lumbosacral region: Secondary | ICD-10-CM | POA: Diagnosis not present

## 2016-04-23 ENCOUNTER — Emergency Department (HOSPITAL_COMMUNITY)
Admission: EM | Admit: 2016-04-23 | Discharge: 2016-04-23 | Disposition: A | Discharge status: Home / Self Care | Payer: Medicare Other | Attending: Emergency Medicine | Admitting: Emergency Medicine

## 2016-04-23 ENCOUNTER — Encounter (HOSPITAL_COMMUNITY): Payer: Self-pay | Admitting: *Deleted

## 2016-04-23 ENCOUNTER — Emergency Department (HOSPITAL_COMMUNITY): Payer: Medicare Other

## 2016-04-23 DIAGNOSIS — M4802 Spinal stenosis, cervical region: Secondary | ICD-10-CM | POA: Diagnosis not present

## 2016-04-23 DIAGNOSIS — Z8546 Personal history of malignant neoplasm of prostate: Secondary | ICD-10-CM | POA: Insufficient documentation

## 2016-04-23 DIAGNOSIS — R200 Anesthesia of skin: Secondary | ICD-10-CM

## 2016-04-23 DIAGNOSIS — M545 Low back pain, unspecified: Secondary | ICD-10-CM

## 2016-04-23 DIAGNOSIS — H538 Other visual disturbances: Secondary | ICD-10-CM | POA: Diagnosis not present

## 2016-04-23 DIAGNOSIS — M50222 Other cervical disc displacement at C5-C6 level: Secondary | ICD-10-CM | POA: Diagnosis not present

## 2016-04-23 LAB — URINE MICROSCOPIC-ADD ON
BACTERIA UA: NONE SEEN
WBC, UA: NONE SEEN WBC/hpf (ref 0–5)

## 2016-04-23 LAB — URINALYSIS, ROUTINE W REFLEX MICROSCOPIC
BILIRUBIN URINE: NEGATIVE
Glucose, UA: NEGATIVE mg/dL
KETONES UR: NEGATIVE mg/dL
LEUKOCYTES UA: NEGATIVE
NITRITE: NEGATIVE
PROTEIN: NEGATIVE mg/dL
Specific Gravity, Urine: 1.01 (ref 1.005–1.030)
pH: 7 (ref 5.0–8.0)

## 2016-04-23 LAB — I-STAT TROPONIN, ED: TROPONIN I, POC: 0 ng/mL (ref 0.00–0.08)

## 2016-04-23 LAB — BASIC METABOLIC PANEL
Anion gap: 9 (ref 5–15)
BUN: 16 mg/dL (ref 6–20)
CO2: 28 mmol/L (ref 22–32)
Calcium: 9.3 mg/dL (ref 8.9–10.3)
Chloride: 101 mmol/L (ref 101–111)
Creatinine, Ser: 0.82 mg/dL (ref 0.61–1.24)
GFR calc Af Amer: >60 mL/min (ref >60)
GLUCOSE: 115 mg/dL — AB (ref 65–99)
POTASSIUM: 3.8 mmol/L (ref 3.5–5.1)
Sodium: 138 mmol/L (ref 135–145)

## 2016-04-23 LAB — CBC
HEMATOCRIT: 43.7 % (ref 39.0–52.0)
HEMOGLOBIN: 14.7 g/dL (ref 13.0–17.0)
MCH: 29.2 pg (ref 26.0–34.0)
MCHC: 33.6 g/dL (ref 30.0–36.0)
MCV: 86.9 fL (ref 78.0–100.0)
Platelets: 191 10*3/uL (ref 150–400)
RBC: 5.03 MIL/uL (ref 4.22–5.81)
RDW: 13.1 % (ref 11.5–15.5)
WBC: 4.1 10*3/uL (ref 4.0–10.5)

## 2016-04-23 NOTE — ED Provider Notes (Signed)
[place] DEPT Provider Note  CSN: [id number] Arrival date & time: 04/23/16  1658  History   Chief Complaint Chief Complaint  Patient presents with  . Numbness   HPI Joseph Marsh is a 70 y.o. male.   Neurologic Problem  This is a recurrent problem. The current episode started 3 to 5 hours ago (4:30 PM). The problem occurs constantly. The problem has been resolved. Pertinent negatives include no chest pain, no abdominal pain, no headaches and no shortness of breath. Nothing aggravates the symptoms. Nothing relieves the symptoms.   Past Medical History:  Diagnosis Date  . Kidney stone   . Prostate cancer ([place])   . Sinusitis    Patient Active Problem List   Diagnosis Date Noted  . Cholelithiasis with acute cholecystitis 05/24/2013  . Acute calculous cholecystitis 05/24/2013  . Prostate CA ([place]) 04/05/2012   Past Surgical History:  Procedure Laterality Date  . CHOLECYSTECTOMY N/A 05/24/2013   Procedure: LAPAROSCOPIC CHOLECYSTECTOMY;  Surgeon: Edward Jolly, MD;  Location: [facility];  Service: General;  Laterality: N/A;  . KNEE SURGERY    . LITHOTRIPSY    . TONSILLECTOMY       Home Medications    Prior to Admission medications   Medication Sig Start Date End Date Taking? Authorizing Provider  BIOTIN PO Take 1 tablet by mouth daily.   Yes Historical Provider, MD  Cholecalciferol (VITAMIN D-3 PO) Take 1 tablet by mouth every other day.    Yes Historical Provider, MD  Glucos-Chondroit-Collag-Hyal (GLUCOSAMINE CHONDROIT-COLLAGEN PO) Take 2 capsules by mouth daily.    Yes Historical Provider, MD  Multiple Vitamin (MULTIVITAMIN) tablet Take 1 tablet by mouth daily.   Yes Historical Provider, MD  amoxicillin-clavulanate (AUGMENTIN) 875-125 MG per tablet Take 1 tablet by mouth every 12 (twelve) hours. Patient not taking: Reported on [id number]   Leighton Ruff, MD  HYDROcodone-acetaminophen (NORCO/VICODIN) 5-325 MG per tablet Take 1-2 tablets by mouth every  4 (four) hours as needed. Patient not taking: Reported on 04/23/2016 05/21/13   Alvina Chou, PA-C  pantoprazole (PROTONIX) 20 MG tablet Take 1 tablet (20 mg total) by mouth daily. Patient not taking: Reported on 04/23/2016 05/21/13   Alvina Chou, PA-C    Family History Family History  Problem Relation Age of Onset  . Melanoma Brother   . Heart disease Father     died from    Social History Social History  Substance Use Topics  . Smoking status: Never Smoker  . Smokeless tobacco: Never Used  . Alcohol use No   Allergies   Patient has no known allergies.  Review of Systems Review of Systems  Constitutional: Negative for fatigue and fever.  Respiratory: Negative for shortness of breath.   Cardiovascular: Negative for chest pain.  Gastrointestinal: Negative for abdominal pain.  Neurological: Positive for numbness. Negative for weakness and headaches.  All other systems reviewed and are negative.  Physical Exam Updated Vital Signs BP 124/73 (BP Location: Right Arm)   Pulse 72   Temp 97.6 F (36.4 C) (Oral)   Resp 16   Ht 5' 10.5" (1.791 m)   Wt 79.4 kg   SpO2 99%   BMI 24.75 kg/m   Physical Exam  Constitutional: He is oriented to person, place, and time. He appears well-developed and well-nourished. No distress.  HENT:  Head: Normocephalic.  Eyes: EOM are normal. Pupils are equal, round, and reactive to light.  Neck: Neck supple.  Cardiovascular: Normal rate and regular rhythm.   Pulmonary/Chest: Effort  normal. No respiratory distress. He has no wheezes.  Abdominal: Soft. He exhibits no distension. There is no tenderness.  Musculoskeletal: Normal range of motion. He exhibits no edema or deformity.  Neurological: He is alert and oriented to person, place, and time. No cranial nerve deficit. He exhibits normal muscle tone. Coordination normal.  5/5 bilateral intrinsic hand, bicep flexion, tricep extension  5/5 bilateral plantar flexion, dorsiflexion, hip  flexion  Normal finger to nose.   Normal heel to shin.  Normal gait  Skin: Skin is warm. Capillary refill takes less than 2 seconds. He is not diaphoretic. No erythema. No pallor.  Psychiatric: He has a normal mood and affect. His behavior is normal.  Nursing note and vitals reviewed.  ED Treatments / Results  Labs (all labs ordered are listed, but only abnormal results are displayed) Labs Reviewed  BASIC METABOLIC PANEL - Abnormal; Notable for the following:       Result Value   Glucose, Bld 115 (*)    All other components within normal limits  URINALYSIS, ROUTINE W REFLEX MICROSCOPIC (NOT AT [facility]) - Abnormal; Notable for the following:    Hgb urine dipstick TRACE (*)    All other components within normal limits  URINE MICROSCOPIC-ADD ON - Abnormal; Notable for the following:    Squamous Epithelial / LPF 0-5 (*)    All other components within normal limits  CBC  I-STAT TROPOININ, ED   Radiology Ct Head Wo Contrast  Result Date: 04/23/2016 CLINICAL DATA:  Left arm numbness began a few hours ago and lasted for 15 minutes. EXAM: CT HEAD WITHOUT CONTRAST TECHNIQUE: Contiguous axial images were obtained from the base of the skull through the vertex without intravenous contrast. COMPARISON:  September 10, 2009 FINDINGS: Brain: No subdural, epidural, or subarachnoid hemorrhage. The cerebellum, brainstem, and basal cisterns are normal. No mass, mass effect, or midline shift. Scattered white matter changes are identified. No acute cortical ischemia or infarct. Vascular: No hyperdense vessel or unexpected calcification. Skull: Normal. Negative for fracture or focal lesion. Sinuses/Orbits: Mucosal thickening is seen in the left greater than right maxillary sinuses. Paranasal sinuses and mastoid air cells are otherwise normal. The middle ears are well aerated. Other: None. IMPRESSION: No acute intracranial process identified. Electronically Signed   By: Dorise Bullion III M.D   On: 04/23/2016 18:13    Mr Brain Wo Contrast  Result Date: 04/23/2016 CLINICAL DATA:  Numbness of the fourth and fifth digits of the left hand beginning today. Visual disturbance. EXAM: MRI HEAD WITHOUT CONTRAST TECHNIQUE: Multiplanar, multiecho pulse sequences of the brain and surrounding structures were obtained without intravenous contrast. COMPARISON:  CT same day.  MRI 09/11/2009 FINDINGS: Brain: Diffusion imaging does not show any acute or subacute infarction. There are mild chronic small-vessel ischemic changes of the pons. The cerebellum is normal. Cerebral hemispheres show moderate chronic small-vessel ischemic changes of the deep and subcortical white matter. No cortical or large vessel territory infarction. No mass lesion, hemorrhage, hydrocephalus or extra-axial collection. No pituitary mass. Vascular: Major vessels at the base of the brain show flow. Skull and upper cervical spine: Negative Sinuses/Orbits: Some mucosal thickening of left maxillary sinus. Others clear. Orbits negative. Other: None significant IMPRESSION: No acute finding. Mild to moderate chronic small-vessel ischemic changes affecting the pons and cerebral hemispheric white matter. Electronically Signed   By: Nelson Chimes M.D.   On: 04/23/2016 21:44   Mr Cervical Spine Wo Contrast  Result Date: 04/23/2016 CLINICAL DATA:  Numbness of the fourth  and fifth digit of the left hand. Some associated visual disturbance. EXAM: MRI CERVICAL SPINE WITHOUT CONTRAST TECHNIQUE: Multiplanar, multisequence MR imaging of the cervical spine was performed. No intravenous contrast was administered. COMPARISON:  None. FINDINGS: Alignment: Slight straightening of the normal cervical lordosis. Vertebrae: No fracture or primary bone lesion. Cord: No cord compression or primary cord lesion. Posterior Fossa, vertebral arteries, paraspinal tissues: Negative Disc levels: Foramen magnum is normal. There is ordinary osteoarthritis of the C1-2 articulation without encroachment  upon the neural spaces. C2-3: Mild bulging of the disc. Facet degeneration on the left. No central canal stenosis. Mild left foraminal narrowing. C3-4: Mild bulging of the disc. Facet arthropathy on the right. No central canal stenosis. Right foraminal stenosis. C4-5: Mild bulging of the disc. Mild facet degeneration on the left. No canal stenosis. Mild foraminal narrowing on the left. C5-6: Spondylosis with endplate osteophytes and bulging of the disc. Narrowing of the ventral subarachnoid space but no compression of the cord. No facet arthropathy. Moderate foraminal narrowing bilaterally could affect either C6 nerve root. C6-7: Spondylosis with endplate osteophytes and bulging of the disc. Narrowing of the ventral subarachnoid space but no compression of the cord. No facet arthropathy. Foraminal narrowing left more than right could affect either C7 nerve root, more likely the left. C7-T1: Minimal facet degeneration. No disc pathology. No stenosis. 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 At C5-6 and C6-7, there is canal narrowing but no cord compression. There is foraminal stenosis that could compress the C6 or C7 nerve roots. Foraminal stenosis is most pronounced on the left at C6-7, which may correlate with the clinical presentation. Electronically Signed   By: Nelson Chimes M.D.   On: 04/23/2016 21:49   Procedures Procedures (including critical care time)  Medications Ordered in ED Medications - No data to display   Initial Impression / Assessment and Plan / ED Course  I have reviewed the triage vital signs and the nursing notes.  Pertinent labs & imaging results that were available during my care of the patient were reviewed by me and considered in my medical decision making (see chart for details).  Clinical Course    Patient is a pleasant 70 year old male who presents emergency department today 15 minutes of transient left fifth digit and fourth digit numbness that radiated  to his hand and elbow. Patient states that this was while he was sitting at his desk.   He states this happened a couple times in the past couple weeks has a history of spinal stenosis.  Patient has no symptoms at this time. He is well-appearing.  Patient's neurovascularly intact without any neurological deficits.  Possible ulnar nerve distribution. Cervical stenosis possible. Unlikely TIA.   CT head unremarkable.   We'll obtain MRI of brain and cervical spine.  Patient found have pronounced foraminal stenosis at C6. Given lack of additional symptomatology, will have patient follow up with his spine surgeon.    Patient in agreement with plan and discharged home in good health.    Final Clinical Impressions(s) / ED Diagnoses   Final diagnoses:  Lower back pain  Left leg numbness  Foraminal stenosis of cervical region    New Prescriptions New Prescriptions   No medications on file     Joseph Scales, MD 04/23/16 [place], MD 04/24/16 1015

## 2016-04-23 NOTE — ED Notes (Signed)
Pt notified to let waiting room staff know if any symptoms return.

## 2016-04-23 NOTE — ED Notes (Signed)
Patient transported to MRI 

## 2016-04-23 NOTE — ED Triage Notes (Signed)
Pt states he began experiencing numbness to 4th and 5th digit of L hand that began moving up his L wrist.  At same time, pt began seeing lat flashes to both eyes.  Symptoms lasted 15 minutes.   All symptoms have resolved.  PT began crying in triage stating he's been under a lot of stress.

## 2016-04-24 NOTE — ED Provider Notes (Signed)
Medical screening examination/treatment/procedure(s) were conducted as a shared visit with resident and myself.  I personally evaluated the patient during the encounter. See separately dictated note for further details.    EKG Interpretation  Date/Time:  Friday April 23 2016 17:14:49 EST Ventricular Rate:  62 PR Interval:  132 QRS Duration: 96 QT Interval:  412 QTC Calculation: 418 R Axis:   47 Text Interpretation:  Normal sinus rhythm Normal ECG ECG appears similar to prior Confirmed by Sherry Ruffing MD, [place] [phone]) on 04/24/2016 9:46:04 AM            Duffy Bruce, MD 04/24/16 1545

## 2016-05-11 DIAGNOSIS — H5319 Other subjective visual disturbances: Secondary | ICD-10-CM | POA: Diagnosis not present

## 2016-05-11 DIAGNOSIS — H43391 Other vitreous opacities, right eye: Secondary | ICD-10-CM | POA: Diagnosis not present

## 2016-05-11 DIAGNOSIS — H40013 Open angle with borderline findings, low risk, bilateral: Secondary | ICD-10-CM | POA: Diagnosis not present

## 2016-06-11 DIAGNOSIS — L309 Dermatitis, unspecified: Secondary | ICD-10-CM | POA: Diagnosis not present

## 2016-06-11 DIAGNOSIS — L821 Other seborrheic keratosis: Secondary | ICD-10-CM | POA: Diagnosis not present

## 2016-06-28 DIAGNOSIS — R7303 Prediabetes: Secondary | ICD-10-CM | POA: Diagnosis not present

## 2016-06-28 DIAGNOSIS — Z23 Encounter for immunization: Secondary | ICD-10-CM | POA: Diagnosis not present

## 2016-06-28 DIAGNOSIS — E7800 Pure hypercholesterolemia, unspecified: Secondary | ICD-10-CM | POA: Diagnosis not present

## 2016-07-08 DIAGNOSIS — Z8546 Personal history of malignant neoplasm of prostate: Secondary | ICD-10-CM | POA: Diagnosis not present

## 2016-07-08 DIAGNOSIS — R7309 Other abnormal glucose: Secondary | ICD-10-CM | POA: Diagnosis not present

## 2016-07-08 DIAGNOSIS — Z0000 Encounter for general adult medical examination without abnormal findings: Secondary | ICD-10-CM | POA: Diagnosis not present

## 2016-07-08 DIAGNOSIS — K625 Hemorrhage of anus and rectum: Secondary | ICD-10-CM | POA: Diagnosis not present

## 2016-07-08 DIAGNOSIS — E7800 Pure hypercholesterolemia, unspecified: Secondary | ICD-10-CM | POA: Diagnosis not present

## 2016-11-10 DIAGNOSIS — H40013 Open angle with borderline findings, low risk, bilateral: Secondary | ICD-10-CM | POA: Diagnosis not present

## 2016-11-10 DIAGNOSIS — H5319 Other subjective visual disturbances: Secondary | ICD-10-CM | POA: Diagnosis not present

## 2016-11-10 DIAGNOSIS — E113291 Type 2 diabetes mellitus with mild nonproliferative diabetic retinopathy without macular edema, right eye: Secondary | ICD-10-CM | POA: Diagnosis not present

## 2016-11-10 DIAGNOSIS — E119 Type 2 diabetes mellitus without complications: Secondary | ICD-10-CM | POA: Diagnosis not present

## 2016-11-30 DIAGNOSIS — R361 Hematospermia: Secondary | ICD-10-CM | POA: Diagnosis not present

## 2016-12-17 DIAGNOSIS — S20461S Insect bite (nonvenomous) of right back wall of thorax, sequela: Secondary | ICD-10-CM | POA: Diagnosis not present

## 2017-01-07 DIAGNOSIS — R7303 Prediabetes: Secondary | ICD-10-CM | POA: Diagnosis not present

## 2017-01-07 DIAGNOSIS — E7800 Pure hypercholesterolemia, unspecified: Secondary | ICD-10-CM | POA: Diagnosis not present

## 2017-01-10 DIAGNOSIS — E7800 Pure hypercholesterolemia, unspecified: Secondary | ICD-10-CM | POA: Diagnosis not present

## 2017-01-10 DIAGNOSIS — K210 Gastro-esophageal reflux disease with esophagitis: Secondary | ICD-10-CM | POA: Diagnosis not present

## 2017-01-10 DIAGNOSIS — Z8546 Personal history of malignant neoplasm of prostate: Secondary | ICD-10-CM | POA: Diagnosis not present

## 2017-01-10 DIAGNOSIS — R7303 Prediabetes: Secondary | ICD-10-CM | POA: Diagnosis not present

## 2017-01-10 DIAGNOSIS — Z1211 Encounter for screening for malignant neoplasm of colon: Secondary | ICD-10-CM | POA: Diagnosis not present

## 2017-01-22 ENCOUNTER — Encounter (HOSPITAL_COMMUNITY): Payer: Self-pay | Admitting: *Deleted

## 2017-01-22 ENCOUNTER — Emergency Department (HOSPITAL_COMMUNITY)
Admission: EM | Admit: 2017-01-22 | Discharge: 2017-01-22 | Disposition: A | Discharge status: Home / Self Care | Payer: Medicare Other | Attending: Emergency Medicine | Admitting: Emergency Medicine

## 2017-01-22 DIAGNOSIS — D229 Melanocytic nevi, unspecified: Secondary | ICD-10-CM | POA: Insufficient documentation

## 2017-01-22 DIAGNOSIS — W57XXXA Bitten or stung by nonvenomous insect and other nonvenomous arthropods, initial encounter: Secondary | ICD-10-CM | POA: Insufficient documentation

## 2017-01-22 DIAGNOSIS — Y93H2 Activity, gardening and landscaping: Secondary | ICD-10-CM | POA: Diagnosis not present

## 2017-01-22 DIAGNOSIS — Y999 Unspecified external cause status: Secondary | ICD-10-CM | POA: Diagnosis not present

## 2017-01-22 DIAGNOSIS — Y92007 Garden or yard of unspecified non-institutional (private) residence as the place of occurrence of the external cause: Secondary | ICD-10-CM | POA: Diagnosis not present

## 2017-01-22 DIAGNOSIS — S70262A Insect bite (nonvenomous), left hip, initial encounter: Secondary | ICD-10-CM | POA: Diagnosis present

## 2017-01-22 NOTE — Discharge Instructions (Signed)
The spot on your hip looks like an irregular mole. Recommend to have this checked with your dermatologist at your appt. Return here for any new concerns.

## 2017-01-22 NOTE — ED Provider Notes (Signed)
[facility] DEPT Provider Note   CSN: [id number] Arrival date & time: 01/22/17  2136   By signing my name below, I, Eunice Blase, attest that this documentation has been prepared under the direction and in the presence of Quincy Carnes, PA-C. Electronically signed, Eunice Blase, ED Scribe. 01/22/17. 10:48 PM.  History   Chief Complaint Chief Complaint  Patient presents with  . Tick Removal   The history is provided by the patient and medical records. No language interpreter was used.    Joseph Marsh is a 71 y.o. male with h/o prostate cancer presenting to the Emergency Department concerning a believed foreign body lodged in the L hip.  Pt believes this to be a tick; he states it turned black when he attempted to remove the object with tweezers. He states he did several hours of yard work today. No pain or sensation of being bitten noted. PCP and dermatologist noted for F/U. Pt states he has a F/U with his dermatologist in ~2 weeks. No other complaints at this time.   Past Medical History:  Diagnosis Date  . Kidney stone   . Prostate cancer ([place])   . Sinusitis     Patient Active Problem List   Diagnosis Date Noted  . Cholelithiasis with acute cholecystitis 05/24/2013  . Acute calculous cholecystitis 05/24/2013  . Prostate CA ([place]) 04/05/2012    Past Surgical History:  Procedure Laterality Date  . CHOLECYSTECTOMY N/A 05/24/2013   Procedure: LAPAROSCOPIC CHOLECYSTECTOMY;  Surgeon: Edward Jolly, MD;  Location: [facility];  Service: General;  Laterality: N/A;  . KNEE SURGERY    . LITHOTRIPSY    . TONSILLECTOMY         Home Medications    Prior to Admission medications   Medication Sig Start Date End Date Taking? Authorizing Provider  amoxicillin-clavulanate (AUGMENTIN) 875-125 MG per tablet Take 1 tablet by mouth every 12 (twelve) hours. Patient not taking: Reported on 04/23/2016 26/71/24   Leighton Ruff, MD  BIOTIN PO Take 1 tablet by mouth daily.     [provider]  Cholecalciferol (VITAMIN D-3 PO) Take 1 tablet by mouth every other day.     [provider]  Glucos-Chondroit-Collag-Hyal (GLUCOSAMINE CHONDROIT-COLLAGEN PO) Take 2 capsules by mouth daily.     [provider]  HYDROcodone-acetaminophen (NORCO/VICODIN) 5-325 MG per tablet Take 1-2 tablets by mouth every 4 (four) hours as needed. Patient not taking: Reported on 04/23/2016 05/21/13   Alvina Chou, PA-C  Multiple Vitamin (MULTIVITAMIN) tablet Take 1 tablet by mouth daily.    [provider]  pantoprazole (PROTONIX) 20 MG tablet Take 1 tablet (20 mg total) by mouth daily. Patient not taking: Reported on 04/23/2016 05/21/13   Alvina Chou, PA-C    Family History Family History  Problem Relation Age of Onset  . Melanoma Brother   . Heart disease Father        died from    Social History Social History  Substance Use Topics  . Smoking status: Never Smoker  . Smokeless tobacco: Never Used  . Alcohol use No     Allergies   Patient has no known allergies.   Review of Systems Review of Systems  Constitutional: Negative for diaphoresis and fever.  Respiratory: Negative for shortness of breath.   Gastrointestinal: Negative for abdominal pain, nausea and vomiting.  Musculoskeletal: Negative for arthralgias, joint swelling and myalgias.  Skin: Positive for color change and wound.  Neurological: Negative for weakness and numbness.  All other systems reviewed  and are negative.    Physical Exam Updated Vital Signs BP 126/87 (BP Location: Left Arm)   Pulse 92   Temp 98 F (36.7 C) (Oral)   Resp 18   SpO2 95%   Physical Exam  Constitutional: He is oriented to person, place, and time. He appears well-developed and well-nourished.  HENT:  Head: Normocephalic and atraumatic.  Mouth/Throat: Oropharynx is clear and moist.  Eyes: Pupils are equal, round, and reactive to light. Conjunctivae and EOM are normal.  Neck:  Normal range of motion.  Cardiovascular: Normal rate, regular rhythm and normal heart sounds.   Pulmonary/Chest: Effort normal and breath sounds normal.  Abdominal: Soft. Bowel sounds are normal.  Musculoskeletal: Normal range of motion.  Neurological: He is alert and oriented to person, place, and time.  Skin: Skin is warm and dry.  Small area of discoloration to left lateral hip without visible parts or legs; area is flat and not raised; skin was probed multiple times, no evidence of FB beneath skin; area appears to be a discolored mole  Psychiatric: He has a normal mood and affect.  Nursing note and vitals reviewed.    ED Treatments / Results  DIAGNOSTIC STUDIES: Oxygen Saturation is 95% on RA, adequate by my interpretation.    COORDINATION OF CARE: 10:47 PM-Discussed next steps with pt. Pt verbalized understanding and is agreeable with the plan. Pt prepared for d/c, advised of symptomatic care at home, F/U instructions and return precautions.    Labs (all labs ordered are listed, but only abnormal results are displayed) Labs Reviewed - No data to display  EKG  EKG Interpretation None       Radiology No results found.  Procedures Procedures (including critical care time)  Medications Ordered in ED Medications - No data to display   Initial Impression / Assessment and Plan / ED Course  I have reviewed the triage vital signs and the nursing notes.  Pertinent labs & imaging results that were available during my care of the patient were reviewed by me and considered in my medical decision making (see chart for details).  71 year old male here with concern of tick bite at left hip. On exam there is a discolored area noted of the left lateral hip. This area was examined very closely, there is no evidence of tic or other insect with parts, legs, etc remaining.  Area is flat and not raised, I do not appreciate any signs of FB beneath the skin.  Area was probed and rexamined  multiple times.  Area appears to be a discolored mole.  Will have him follow-up closely with his dermatologist to have this rechecked.  Discussed plan with patient, he acknowledged understanding and agreed with plan of care.  Return precautions given for new or worsening symptoms.  Final Clinical Impressions(s) / ED Diagnoses   Final diagnoses:  Atypical mole    New Prescriptions New Prescriptions   No medications on file   I personally performed the services described in this documentation, which was scribed in my presence. The recorded information has been reviewed and is accurate.   Larene Pickett, PA-C 01/22/17 [place] Ades    Quintella Reichert, MD 01/22/17 [phone]

## 2017-01-22 NOTE — ED Triage Notes (Signed)
The pt has a tick on his rt hip just noticed today he was unable to remove it

## 2017-02-02 DIAGNOSIS — Z8546 Personal history of malignant neoplasm of prostate: Secondary | ICD-10-CM | POA: Diagnosis not present

## 2017-02-04 DIAGNOSIS — D2261 Melanocytic nevi of right upper limb, including shoulder: Secondary | ICD-10-CM | POA: Diagnosis not present

## 2017-02-04 DIAGNOSIS — D225 Melanocytic nevi of trunk: Secondary | ICD-10-CM | POA: Diagnosis not present

## 2017-02-04 DIAGNOSIS — L821 Other seborrheic keratosis: Secondary | ICD-10-CM | POA: Diagnosis not present

## 2017-02-04 DIAGNOSIS — D1801 Hemangioma of skin and subcutaneous tissue: Secondary | ICD-10-CM | POA: Diagnosis not present

## 2017-02-04 DIAGNOSIS — D2262 Melanocytic nevi of left upper limb, including shoulder: Secondary | ICD-10-CM | POA: Diagnosis not present

## 2017-02-04 DIAGNOSIS — D224 Melanocytic nevi of scalp and neck: Secondary | ICD-10-CM | POA: Diagnosis not present

## 2017-02-09 DIAGNOSIS — C61 Malignant neoplasm of prostate: Secondary | ICD-10-CM | POA: Diagnosis not present

## 2017-03-28 DIAGNOSIS — Z23 Encounter for immunization: Secondary | ICD-10-CM | POA: Diagnosis not present

## 2017-04-21 DIAGNOSIS — H40013 Open angle with borderline findings, low risk, bilateral: Secondary | ICD-10-CM | POA: Diagnosis not present

## 2017-07-05 DIAGNOSIS — R739 Hyperglycemia, unspecified: Secondary | ICD-10-CM | POA: Diagnosis not present

## 2017-07-05 DIAGNOSIS — E7800 Pure hypercholesterolemia, unspecified: Secondary | ICD-10-CM | POA: Diagnosis not present

## 2017-07-05 LAB — HEMOGLOBIN A1C: Hemoglobin A1C: 6.2

## 2017-07-07 LAB — BASIC METABOLIC PANEL
BUN: 12 (ref 4–21)
CREATININE: 0.9 (ref 0.6–1.3)
Glucose: 116
Potassium: 4.2 (ref 3.4–5.3)
Sodium: 139 (ref 137–147)

## 2017-07-07 LAB — HEPATIC FUNCTION PANEL
ALT: 29 (ref 10–40)
AST: 19 (ref 14–40)
Bilirubin, Total: 0.5

## 2017-07-11 DIAGNOSIS — R739 Hyperglycemia, unspecified: Secondary | ICD-10-CM | POA: Diagnosis not present

## 2017-07-11 DIAGNOSIS — Z8546 Personal history of malignant neoplasm of prostate: Secondary | ICD-10-CM | POA: Diagnosis not present

## 2017-07-11 DIAGNOSIS — E7800 Pure hypercholesterolemia, unspecified: Secondary | ICD-10-CM | POA: Diagnosis not present

## 2017-07-11 DIAGNOSIS — Z0000 Encounter for general adult medical examination without abnormal findings: Secondary | ICD-10-CM | POA: Diagnosis not present

## 2017-07-11 DIAGNOSIS — N529 Male erectile dysfunction, unspecified: Secondary | ICD-10-CM | POA: Diagnosis not present

## 2017-08-15 DIAGNOSIS — L2089 Other atopic dermatitis: Secondary | ICD-10-CM | POA: Diagnosis not present

## 2017-08-15 DIAGNOSIS — L300 Nummular dermatitis: Secondary | ICD-10-CM | POA: Diagnosis not present

## 2017-11-05 DIAGNOSIS — M67911 Unspecified disorder of synovium and tendon, right shoulder: Secondary | ICD-10-CM | POA: Diagnosis not present

## 2017-11-10 DIAGNOSIS — H02833 Dermatochalasis of right eye, unspecified eyelid: Secondary | ICD-10-CM | POA: Diagnosis not present

## 2017-11-10 DIAGNOSIS — H40013 Open angle with borderline findings, low risk, bilateral: Secondary | ICD-10-CM | POA: Diagnosis not present

## 2017-11-10 DIAGNOSIS — H0014 Chalazion left upper eyelid: Secondary | ICD-10-CM | POA: Diagnosis not present

## 2017-11-14 DIAGNOSIS — H0014 Chalazion left upper eyelid: Secondary | ICD-10-CM | POA: Diagnosis not present

## 2017-11-14 DIAGNOSIS — H02833 Dermatochalasis of right eye, unspecified eyelid: Secondary | ICD-10-CM | POA: Diagnosis not present

## 2017-11-15 DIAGNOSIS — M7541 Impingement syndrome of right shoulder: Secondary | ICD-10-CM | POA: Diagnosis not present

## 2017-11-15 DIAGNOSIS — M25511 Pain in right shoulder: Secondary | ICD-10-CM | POA: Diagnosis not present

## 2017-11-16 DIAGNOSIS — M25511 Pain in right shoulder: Secondary | ICD-10-CM | POA: Diagnosis not present

## 2017-11-16 DIAGNOSIS — M7541 Impingement syndrome of right shoulder: Secondary | ICD-10-CM | POA: Diagnosis not present

## 2017-11-17 DIAGNOSIS — H00024 Hordeolum internum left upper eyelid: Secondary | ICD-10-CM | POA: Diagnosis not present

## 2017-11-22 DIAGNOSIS — M7541 Impingement syndrome of right shoulder: Secondary | ICD-10-CM | POA: Diagnosis not present

## 2017-11-22 DIAGNOSIS — M25511 Pain in right shoulder: Secondary | ICD-10-CM | POA: Diagnosis not present

## 2017-11-24 DIAGNOSIS — M25511 Pain in right shoulder: Secondary | ICD-10-CM | POA: Diagnosis not present

## 2017-11-24 DIAGNOSIS — M7541 Impingement syndrome of right shoulder: Secondary | ICD-10-CM | POA: Diagnosis not present

## 2017-11-29 DIAGNOSIS — M25511 Pain in right shoulder: Secondary | ICD-10-CM | POA: Diagnosis not present

## 2017-11-29 DIAGNOSIS — M7541 Impingement syndrome of right shoulder: Secondary | ICD-10-CM | POA: Diagnosis not present

## 2017-12-01 DIAGNOSIS — M7541 Impingement syndrome of right shoulder: Secondary | ICD-10-CM | POA: Diagnosis not present

## 2017-12-01 DIAGNOSIS — M25511 Pain in right shoulder: Secondary | ICD-10-CM | POA: Diagnosis not present

## 2017-12-06 DIAGNOSIS — M25511 Pain in right shoulder: Secondary | ICD-10-CM | POA: Diagnosis not present

## 2017-12-06 DIAGNOSIS — M7541 Impingement syndrome of right shoulder: Secondary | ICD-10-CM | POA: Diagnosis not present

## 2017-12-08 DIAGNOSIS — M7541 Impingement syndrome of right shoulder: Secondary | ICD-10-CM | POA: Diagnosis not present

## 2017-12-08 DIAGNOSIS — M25511 Pain in right shoulder: Secondary | ICD-10-CM | POA: Diagnosis not present

## 2017-12-09 DIAGNOSIS — L308 Other specified dermatitis: Secondary | ICD-10-CM | POA: Diagnosis not present

## 2017-12-09 DIAGNOSIS — L300 Nummular dermatitis: Secondary | ICD-10-CM | POA: Diagnosis not present

## 2017-12-09 DIAGNOSIS — L2089 Other atopic dermatitis: Secondary | ICD-10-CM | POA: Diagnosis not present

## 2017-12-09 DIAGNOSIS — H00024 Hordeolum internum left upper eyelid: Secondary | ICD-10-CM | POA: Diagnosis not present

## 2017-12-26 DIAGNOSIS — M7541 Impingement syndrome of right shoulder: Secondary | ICD-10-CM | POA: Diagnosis not present

## 2017-12-26 DIAGNOSIS — M25511 Pain in right shoulder: Secondary | ICD-10-CM | POA: Diagnosis not present

## 2017-12-28 DIAGNOSIS — R7303 Prediabetes: Secondary | ICD-10-CM | POA: Diagnosis not present

## 2017-12-28 DIAGNOSIS — E7800 Pure hypercholesterolemia, unspecified: Secondary | ICD-10-CM | POA: Diagnosis not present

## 2017-12-30 LAB — BASIC METABOLIC PANEL
BUN: 17 (ref 4–21)
Creatinine: 0.9 (ref 0.6–1.3)
GLUCOSE: 109
POTASSIUM: 4.2 (ref 3.4–5.3)
SODIUM: 139 (ref 137–147)

## 2017-12-30 LAB — HEPATIC FUNCTION PANEL
ALT: 22 (ref 10–40)
AST: 18 (ref 14–40)
BILIRUBIN, TOTAL: 0.7

## 2017-12-30 LAB — LIPID PANEL
Cholesterol: 174 (ref 0–200)
HDL: 140 — AB (ref 35–70)
LDL CALC: 109
Triglycerides: 153 (ref 40–160)

## 2017-12-30 LAB — HEMOGLOBIN A1C: Hemoglobin A1C: 6.2

## 2018-01-02 DIAGNOSIS — H25013 Cortical age-related cataract, bilateral: Secondary | ICD-10-CM | POA: Diagnosis not present

## 2018-01-02 DIAGNOSIS — H00024 Hordeolum internum left upper eyelid: Secondary | ICD-10-CM | POA: Diagnosis not present

## 2018-01-02 DIAGNOSIS — H2513 Age-related nuclear cataract, bilateral: Secondary | ICD-10-CM | POA: Diagnosis not present

## 2018-01-02 DIAGNOSIS — H40013 Open angle with borderline findings, low risk, bilateral: Secondary | ICD-10-CM | POA: Diagnosis not present

## 2018-01-02 LAB — HM DIABETES EYE EXAM

## 2018-01-05 DIAGNOSIS — Z8546 Personal history of malignant neoplasm of prostate: Secondary | ICD-10-CM | POA: Diagnosis not present

## 2018-01-05 DIAGNOSIS — E7800 Pure hypercholesterolemia, unspecified: Secondary | ICD-10-CM | POA: Diagnosis not present

## 2018-01-05 DIAGNOSIS — R7303 Prediabetes: Secondary | ICD-10-CM | POA: Diagnosis not present

## 2018-01-05 DIAGNOSIS — H9190 Unspecified hearing loss, unspecified ear: Secondary | ICD-10-CM | POA: Diagnosis not present

## 2018-01-05 DIAGNOSIS — N529 Male erectile dysfunction, unspecified: Secondary | ICD-10-CM | POA: Diagnosis not present

## 2018-02-03 DIAGNOSIS — C61 Malignant neoplasm of prostate: Secondary | ICD-10-CM | POA: Diagnosis not present

## 2018-02-10 DIAGNOSIS — D224 Melanocytic nevi of scalp and neck: Secondary | ICD-10-CM | POA: Diagnosis not present

## 2018-02-10 DIAGNOSIS — L308 Other specified dermatitis: Secondary | ICD-10-CM | POA: Diagnosis not present

## 2018-02-10 DIAGNOSIS — L738 Other specified follicular disorders: Secondary | ICD-10-CM | POA: Diagnosis not present

## 2018-02-10 DIAGNOSIS — D2261 Melanocytic nevi of right upper limb, including shoulder: Secondary | ICD-10-CM | POA: Diagnosis not present

## 2018-02-10 DIAGNOSIS — L821 Other seborrheic keratosis: Secondary | ICD-10-CM | POA: Diagnosis not present

## 2018-02-10 DIAGNOSIS — D1801 Hemangioma of skin and subcutaneous tissue: Secondary | ICD-10-CM | POA: Diagnosis not present

## 2018-02-10 DIAGNOSIS — D2262 Melanocytic nevi of left upper limb, including shoulder: Secondary | ICD-10-CM | POA: Diagnosis not present

## 2018-02-15 DIAGNOSIS — Z8546 Personal history of malignant neoplasm of prostate: Secondary | ICD-10-CM | POA: Diagnosis not present

## 2018-02-15 DIAGNOSIS — N5201 Erectile dysfunction due to arterial insufficiency: Secondary | ICD-10-CM | POA: Diagnosis not present

## 2018-02-27 DIAGNOSIS — H0014 Chalazion left upper eyelid: Secondary | ICD-10-CM | POA: Diagnosis not present

## 2018-04-07 DIAGNOSIS — H0014 Chalazion left upper eyelid: Secondary | ICD-10-CM | POA: Diagnosis not present

## 2018-05-09 DIAGNOSIS — L308 Other specified dermatitis: Secondary | ICD-10-CM | POA: Diagnosis not present

## 2018-05-09 DIAGNOSIS — L300 Nummular dermatitis: Secondary | ICD-10-CM | POA: Diagnosis not present

## 2018-05-19 DIAGNOSIS — Z23 Encounter for immunization: Secondary | ICD-10-CM | POA: Diagnosis not present

## 2018-06-19 ENCOUNTER — Ambulatory Visit (INDEPENDENT_AMBULATORY_CARE_PROVIDER_SITE_OTHER): Payer: Medicare Other | Admitting: Internal Medicine

## 2018-06-19 ENCOUNTER — Encounter: Payer: Self-pay | Admitting: Internal Medicine

## 2018-06-19 VITALS — BP 136/62 | HR 67 | Temp 97.9°F | Ht 69.25 in | Wt 178.8 lb

## 2018-06-19 DIAGNOSIS — L309 Dermatitis, unspecified: Secondary | ICD-10-CM | POA: Diagnosis not present

## 2018-06-19 DIAGNOSIS — C61 Malignant neoplasm of prostate: Secondary | ICD-10-CM

## 2018-06-19 DIAGNOSIS — Z23 Encounter for immunization: Secondary | ICD-10-CM | POA: Diagnosis not present

## 2018-06-19 DIAGNOSIS — R0981 Nasal congestion: Secondary | ICD-10-CM | POA: Diagnosis not present

## 2018-06-19 DIAGNOSIS — M79601 Pain in right arm: Secondary | ICD-10-CM

## 2018-06-19 DIAGNOSIS — M48061 Spinal stenosis, lumbar region without neurogenic claudication: Secondary | ICD-10-CM

## 2018-06-19 DIAGNOSIS — E7800 Pure hypercholesterolemia, unspecified: Secondary | ICD-10-CM | POA: Diagnosis not present

## 2018-06-19 DIAGNOSIS — M722 Plantar fascial fibromatosis: Secondary | ICD-10-CM | POA: Diagnosis not present

## 2018-06-19 DIAGNOSIS — R739 Hyperglycemia, unspecified: Secondary | ICD-10-CM | POA: Insufficient documentation

## 2018-06-19 MED ORDER — ZOSTER VAC RECOMB ADJUVANTED 50 MCG/0.5ML IM SUSR: 0.5000 mL | Freq: Once | INTRAMUSCULAR | 1 refills | Status: AC

## 2018-06-19 NOTE — Progress Notes (Signed)
Provider:  Rexene Edison. Mariea Clonts, D.O., C.M.D. Location:   [facility]   Place of Service:   clinic  Previous PCP: Darcus Austin, MD Patient Care Team: Darcus Austin, MD as PCP - General (Family Medicine) Gaynelle Arabian, MD as Consulting Physician (Orthopedic Surgery) Jarome Matin, MD as Consulting Physician (Dermatology) Raynelle Bring, MD as Consulting Physician (Urology)  Extended Emergency Contact Information Primary Emergency Contact: Clearence Ped of [place] Phone: [phone] Relation: Son  Goals of Care: Advanced Directive information Advanced Directives 06/19/2018  Does Patient Have a Medical Advance Directive? No  Would patient like information on creating a medical advance directive? Yes (ED - Information included in AVS)  Pre-existing out of facility DNR order (yellow form or pink MOST form) -    Chief Complaint  Patient presents with  . New Patient (Initial Visit)    new patient    HPI: Patient is a 73 y.o. male seen today to establish with [facility].  Records have been requested from Dr. Darcus Austin.  He has a h/o prostate cancer, kidney stones, plantar fasciitis, right shoulder pain, spinal stenosis and cholelithiasis with acute calculous cholecystitis for which he underwent cholecystectomy 05/24/13.    Has seen Dr. Inda Merlin and she just retired 12/31.  He opted to see a geriatrician.    He had a few more things he added to his new patient packet and brought in the copy with the additions today.  He's been in [place] for 10-11 years.  Before that [place] 20 yrs.  Had been in [place] growing up.  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  Had [employer] obligation.  Worked at [employer] firm for a while while awaiting the draft.  Enlisted in [employer] for 4 yrs.  Officer training didn't open up, but wound up assigned in [place], [place].  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  Spent three years on that.   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  Got into [facility] program while in [employer].  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  Tried to take over a [employer], but it was a failure and ended his marriage.  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 He's now with [employer] for the past 10 years--works full time and takes care of his home and yard.   Is two grandchildren live here.    He's not quite where he'd want to be financially at this point in his life.  He fought his way to get where he did.  His father was an alcoholic.  His mother was an Building services engineer who lived through that.  He's tried to take care of himself.  In 2011, Dr. Inda Merlin had checked his PSA and low and behold, he had prostate cancer.  He had external radiation in 2011 and June 2012 seed implants thru Dr. Valere Dross from radiation oncology.   He had no metastatic disease.  Followed with Dr. Alinda Money from surgery (though he didn't have surgery).  His PSA was down to 0.024 in 2019.  He did have various side effects:  [facility]iance note indicate erectile dysfunction and BPH with straining on urination.  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  Had rectal bleeding I--Dr. Philip Aspen, [place] [facility].  In the 1990s also when  unemployed with two kids in college--finally had cscope in 1996 with polypectomy.  Once in a while, he might get a little blood in his stool (like one-two days every 2-3 months only).     We discussed vaccinations and he has been thinking about whether he wanted to get the shingrix series.  He thinks he had chicken pox.  He went around looking to get the vaccine later and nobody had it.    Has had left eye problem.  Had stye.    For several years, he's had difficulty with eczema on the dorsum of his hands in the  winters.  It lasted after winter resolved and went to back of fingers.  Started working with Dr. Jarome Matin about it who gave him an ointment (steroid).  Then he was put on a new med that was not steroid.  It has come and gone.  His skin was actually breaking open.  Had been better with triamcinolone cream, but then worked out in the yard for several hours a day.  Got worse again.  Got better again and then skipped ointment last night and 4th finger much worse.    Has several musculoskeletal concerns:  Right arm.    Permanent sinus and throat issues.  Had tonsillectomy as child.  He's struggled since then.  He coughed up a ringlike thing in junior high covered in phlegm.    Second time it happened was at [place] when he was required to take swimming--had the same thing happen after getting choked up.  Went to allergist as high school senior.  Had allergy testing done.  He was on desensitization program after that with weekly shots.  Went to infirmary when at [place] to get these injections.  He finally quit going.  No major issues, but annoyance.  His congestion is not the same anymore.   While in air [employer], came down with horrible infection while in the barracks.  He'd had pneumonia and was on bedrest for a couple of weeks.  Saw ENT in [facility] and they felt he had rough results of the tonsillectomy.  He had some cautery done.  Felt better for 2-3 weeks.  He no longer coughs up circles of mucus.  Then congestion and postnasal drainage became different and he's had this same thing since.  Has to flush his sinuses and throat in the morning, after meals and before bed.    Had left knee cartilage degeneration.  Used to run in the past.  2011, his second wife had wanted a larger  disposal under the sink--tore left meniscus.  Had arthroscopic surgery done.  It aches him every day.  He notices pressure or pain daily.    Got a spinal stenosis dx a couple of years ago.    Had right plantar fasciitis and had  to learn how to deal with it.  It is calmed down.    Had some left forearm numbness that came from his neck it turned out--will resolve with massage.  Had it happen with the right hand, too.    Past Medical History:  Diagnosis Date  . Cellulitis   . Eczema   . Hyperlipidemia   . Kidney stone   . Kidney stones   . Plantar fasciitis   . Prostate cancer ([place])   . Right shoulder pain   . Sinusitis   . Spinal stenosis    Past Surgical History:  Procedure Laterality Date  . CHOLECYSTECTOMY N/A 05/24/2013   Procedure:  LAPAROSCOPIC CHOLECYSTECTOMY;  Surgeon: Edward Jolly, MD;  Location: [facility];  Service: General;  Laterality: N/A;  . COLONOSCOPY  1996   polp removal  . KNEE SURGERY    . LITHOTRIPSY    . MRI    . sinus cauterization  1970  . TONSILLECTOMY      Social History   Socioeconomic History  . Marital status: Divorced    Spouse name: Not on file  . Number of children: 2  . Years of education: Not on file  . Highest education level: Not on file  Occupational History    Employer: [employer]  . Financial resource strain: Not on file  . Food insecurity:    Worry: Not on file    Inability: Not on file  . Transportation needs:    Medical: Not on file    Non-medical: Not on file  Tobacco Use  . Smoking status: Never Smoker  . Smokeless tobacco: Never Used  Substance and Sexual Activity  . Alcohol use: No  . Drug use: No  . Sexual activity: Yes  Lifestyle  . Physical activity:    Days per week: Not on file    Minutes per session: Not on file  . Stress: Not on file  Relationships  . Social connections:    Talks on phone: Not on file    Gets together: Not on file    Attends religious service: Not on file    Active member of club or organization: Not on file    Attends meetings of clubs or organizations: Not on file    Relationship status: Not on file  Other Topics Concern  . 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 No   Do you have difficulty preparing food or eating? No   Do you have difficulty managing your medications? No   Do you have difficulty managing your finances? No   Do you have difficulty affording your medications? No    reports that he has never smoked. He has never used smokeless tobacco. He reports that he does not drink alcohol or use drugs.  Functional Status Survey:  independent in adls  Family History  Problem Relation Age of Onset  . Melanoma Brother   . Heart disease Father        died from. Cerebral Hemorrage, smoker and ETOH   . Other Mother        Organ Failure, no specified per [facility] new patient packet   . Osteoporosis Sister   . Diverticulitis Sister   . Thyroid  disease Sister   . Breast cancer Sister   . Diverticulitis Sister   . Depression Son   . Alcoholism Son     Health Maintenance  Topic Date Due  . Hepatitis C Screening  [birth year]  . TETANUS/TDAP  02/21/1963  . PNA vac Low Risk Adult (1 of 2 - PCV13) 02/20/2009  . INFLUENZA VACCINE  01/12/2018  . COLONOSCOPY  04/14/2022    No Known Allergies  Outpatient Encounter Medications as of 06/19/2018  Medication Sig  . Ascorbic Acid (VITAMIN C) 1000 MG tablet Take 1,000 mg by mouth every other day.  Marland Kitchen BIOTIN PO Take 1,000 mg by  mouth daily.   . Cholecalciferol (VITAMIN D-3 PO) Take 1,000 mg by mouth daily.   . Glucos-Chondroit-Collag-Hyal (GLUCOSAMINE CHONDROIT-COLLAGEN PO) Take 1 capsule by mouth 2 (two) times daily.   Marland Kitchen ibuprofen (ADVIL,MOTRIN) 200 MG tablet Take 200 mg by mouth as needed.  Marland Kitchen MELATONIN PO Take 8 mg by mouth at bedtime.  . Misc Natural Products (OSTEO BI-FLEX ADV TRIPLE ST) TABS Take by mouth.  . Multiple Vitamin (MULTIVITAMIN) tablet Take 1 tablet by mouth daily.  Marland Kitchen neomycin-bacitracin-polymyxin (NEOSPORIN) ointment Apply 1 application topically as needed for wound care.  . triamcinolone cream (KENALOG) 0.1 % Apply 1 application topically daily.  . Turmeric Curcumin 500 MG CAPS Take by mouth daily.  Marland Kitchen UNABLE TO FIND Med Name: Steroid Ointment for dermatitis  . [DISCONTINUED] amoxicillin-clavulanate (AUGMENTIN) 875-125 MG per tablet Take 1 tablet by mouth every 12 (twelve) hours. (Patient not taking: Reported on 04/23/2016)  . [DISCONTINUED] pantoprazole (PROTONIX) 20 MG tablet Take 1 tablet (20 mg total) by mouth daily. (Patient not taking: Reported on 04/23/2016)   No facility-administered encounter medications on file as of 06/19/2018.     Review of Systems  Constitutional: Positive for malaise/fatigue.  HENT: Positive for hearing loss.        Sinus congestion; hoareness  Eyes:       Glasses, early cataracts, "occasional retina movement"  Respiratory: Positive for cough and sputum production. Negative for shortness of breath and wheezing.   Cardiovascular: Negative for chest pain, palpitations and leg swelling.  Gastrointestinal: Positive for blood in stool. Negative for abdominal pain, constipation, diarrhea, heartburn and melena.       Very little blood in stools--thinks hemorrhoids, had gallbladder problems but surgery fixed, flatulence  Genitourinary: Positive for urgency. Negative for dysuria.       Minor urgency  Musculoskeletal: Positive for back pain and joint pain. Negative for  falls.       Joint stiffness  Skin:       Dermatitis of hands  Neurological: Negative for dizziness, seizures and loss of consciousness.       Balance problem, memory problem (mild)  Endo/Heme/Allergies: Negative for polydipsia.  Psychiatric/Behavioral: The patient is nervous/anxious.        Stress all his life; financial problems with two divorces    Vitals:   06/19/18 1348  BP: 136/62  Pulse: 67  Temp: 97.9 F (36.6 C)  TempSrc: Oral  SpO2: 97%  Weight: 178 lb 12.8 oz (81.1 kg)  Height: 5' 9.25" (1.759 m)   Body mass index is 26.21 kg/m. Physical Exam Constitutional:      General: He is not in acute distress.    Appearance: Normal appearance. He is normal weight. He is not toxic-appearing.  HENT:     Head: Normocephalic and atraumatic.     Right Ear: External ear normal.     Left  Ear: External ear normal.  Eyes:     Pupils: Pupils are equal, round, and reactive to light.     Comments: glasses  Pulmonary:     Effort: Pulmonary effort is normal.     Breath sounds: Normal breath sounds.  Abdominal:     General: Bowel sounds are normal.  Musculoskeletal: Normal range of motion.  Skin:    General: Skin is warm and dry.     Capillary Refill: Capillary refill takes less than 2 seconds.     Coloration: Skin is pale.  Neurological:     General: No focal deficit present.     Mental Status: He is alert and oriented to person, place, and time.     Gait: Gait normal.  Psychiatric:        Mood and Affect: Mood normal.     Labs reviewed: Basic Metabolic Panel: No results for input(s): NA, K, CL, CO2, GLUCOSE, BUN, CREATININE, CALCIUM, MG, PHOS in the last 8760 hours. Liver Function Tests: No results for input(s): AST, ALT, ALKPHOS, BILITOT, PROT, ALBUMIN in the last 8760 hours. No results for input(s): LIPASE, AMYLASE in the last 8760 hours. No results for input(s): AMMONIA in the last 8760 hours. CBC: No results for input(s): WBC, NEUTROABS, HGB, HCT, MCV, PLT in the  last 8760 hours. Cardiac Enzymes: No results for input(s): CKTOTAL, CKMB, CKMBINDEX, TROPONINI in the last 8760 hours. BNP: Invalid input(s): POCBNP Lab Results  Component Value Date   HGBA1C (H) 09/11/2009    6.4 (NOTE) The ADA recommends the following therapeutic goal for glycemic control related to Hgb A1c measurement: Goal of therapy: <6.5 Hgb A1c  Reference: American Diabetes Association: Clinical Practice Recommendations 2010, Diabetes Care, 2010, 33: (Suppl  1).    Lab Results  Component Value Date   [id number] 932 04/12/2012   Lab Results  Component Value Date   FOLATE >24.8 04/12/2012   Lab Results  Component Value Date   IRON 88 04/12/2012   FERRITIN 47.8 04/12/2012    Assessment/Plan 1. Prostate CA ([place]) -prior radiation and seed implants -most recent PSA very low (see pt's results from urology scanned) - CBC with Differential/Platelet; Future  2. Dermatitis -of hands, ongoing, sees derm for this and uses triamcinolone steroid cream with benefit (gets much worse if stops even just for a day)  3. Sinus congestion -since childhood--he told a story about this that he had not shared in full with anyone before--sounds like he's had problems every since he had a tonsillectomy as a child -cont saline flushing he routinely does - CBC with Differential/Platelet; Future - COMPLETE METABOLIC PANEL WITH GFR; Future  4. Right arm pain -were not able to delve into this much today -uses prn ibuprofen, but sounds like he's not requiring regular meds for pain  5. Need for shingles vaccine -sent to pharmacy: - Zoster Vaccine Adjuvanted [facility]) injection; Inject 0.5 mLs into the muscle once for 1 dose.  Dispense: 0.5 mL; Refill: 1  6. Hyperglycemia - per pt, will f/u labs: - Hemoglobin A1c; Future  7. Pure hypercholesterolemia -not on any prescription therapies for this, f/u labs - CBC with Differential/Platelet; Future - COMPLETE METABOLIC PANEL WITH GFR;  Future - Lipid panel; Future  8. Plantar fasciitis of right foot -encouraged him to continue supportive shoewear and regular stretching to manage this  9. Spinal stenosis of lumbar region without neurogenic claudication -cont prn only ibuprofen, stretching, remaining active  Labs/tests ordered:   Orders Placed This Encounter  Procedures  .  CBC with Differential/Platelet    Standing Status:   Future    Standing Expiration Date:   06/20/2019  . COMPLETE METABOLIC PANEL WITH GFR    Standing Status:   Future    Standing Expiration Date:   06/20/2019  . Hemoglobin A1c    Standing Status:   Future    Standing Expiration Date:   06/20/2019  . Lipid panel    Standing Status:   Future    Standing Expiration Date:   06/20/2019   Next appt:  09/07/2018 med mgt f/u after records obtained and abstracted  Aidel Davisson L. Essam Lowdermilk, D.O. [facility] [address], [place] [address] Cell Phone (Mon-Fri 8am-5pm):  [phone] On Call:  [phone] & follow prompts after 5pm & weekends Office Phone:  [phone] Office Fax:  [phone]

## 2018-08-09 DIAGNOSIS — H00022 Hordeolum internum right lower eyelid: Secondary | ICD-10-CM | POA: Diagnosis not present

## 2018-08-09 DIAGNOSIS — H532 Diplopia: Secondary | ICD-10-CM | POA: Diagnosis not present

## 2018-08-29 ENCOUNTER — Telehealth: Payer: Self-pay | Admitting: *Deleted

## 2018-08-29 NOTE — Telephone Encounter (Signed)
Patient called with concerns if he should keep his appointment next week. Stated that he doesn't feel like he needs to cancel but wanted your opinion. Stated that he is not at [facility] for complications associated with the COVID 19.  He stated that if this is to busy of a time for you he would reschedule. Please Advise.

## 2018-08-30 NOTE — Telephone Encounter (Signed)
It's ok for him to come in for his appt as scheduled.

## 2018-08-30 NOTE — Telephone Encounter (Signed)
Patient notified and agreed.  

## 2018-08-30 NOTE — Telephone Encounter (Signed)
LMOM to return call.

## 2018-09-04 ENCOUNTER — Encounter: Payer: Medicare Other | Admitting: Nurse Practitioner

## 2018-09-04 ENCOUNTER — Other Ambulatory Visit: Payer: Medicare Other

## 2018-09-07 ENCOUNTER — Ambulatory Visit: Payer: Medicare Other | Admitting: Internal Medicine

## 2018-09-07 ENCOUNTER — Encounter: Payer: Medicare Other | Admitting: Internal Medicine

## 2018-10-16 ENCOUNTER — Encounter: Payer: Self-pay | Admitting: Internal Medicine

## 2018-10-16 ENCOUNTER — Telehealth: Payer: Self-pay

## 2018-10-16 NOTE — Telephone Encounter (Signed)
Spots on arms seem unrelated to his penile bleeding.  I agree that pictures of the places on his arms would be helpful to determine what they are.

## 2018-10-16 NOTE — Telephone Encounter (Signed)
Dr. Mariea Clonts  Patient states that he has updated pictures to MyChart for you to look at.  Patient can be reached at [phone]

## 2018-10-16 NOTE — Telephone Encounter (Signed)
Patient called and requested to speak with Dr.Reed. Patient aware Dr.Reed does not have availability for add on telephone visits due to the time of call.   Patient was hesitant to share information with me concerning the nature of call, yet did so anyway's after I explained that Dr.Reed's next available time for a telephone visit is Thursday. Patient states he had prostate cancer in 2012 and now uses a vacuum pump.  Patient had a bleeding incident while using pump. Patient states the insert provided with pump states this as a possible side effect. Patient called urologist and was instructed to call his PCP  Patient then had an incident last night where a red spot appeared on his left forearm (underskin). This morning a red spot (darker than other spot)appeared in a different location on left arm. Patient denies pain in either area yet explains that he has a pulling feeling when he stretches left arm out. Patient not sure if these incidents are related. Patient denies chest pain or shortness of breath.   Please advise

## 2018-10-16 NOTE — Telephone Encounter (Signed)
Left message on voicemail for patient to return call when available    Patient advise to seek medical attention at the emergency room if symptoms progress

## 2018-10-17 ENCOUNTER — Encounter: Payer: Medicare Other | Admitting: Nurse Practitioner

## 2018-10-17 ENCOUNTER — Encounter: Payer: Self-pay | Admitting: Nurse Practitioner

## 2018-10-17 NOTE — Telephone Encounter (Signed)
See Mychart message. Dr.Reed replied to patients uploaded pictures

## 2018-10-19 ENCOUNTER — Encounter: Payer: Self-pay | Admitting: Internal Medicine

## 2018-10-19 ENCOUNTER — Ambulatory Visit (INDEPENDENT_AMBULATORY_CARE_PROVIDER_SITE_OTHER): Payer: Medicare Other | Admitting: Internal Medicine

## 2018-10-19 ENCOUNTER — Other Ambulatory Visit: Payer: Self-pay

## 2018-10-19 DIAGNOSIS — L309 Dermatitis, unspecified: Secondary | ICD-10-CM | POA: Diagnosis not present

## 2018-10-19 DIAGNOSIS — T50905A Adverse effect of unspecified drugs, medicaments and biological substances, initial encounter: Secondary | ICD-10-CM | POA: Diagnosis not present

## 2018-10-19 DIAGNOSIS — L568 Other specified acute skin changes due to ultraviolet radiation: Secondary | ICD-10-CM

## 2018-10-19 DIAGNOSIS — N4889 Other specified disorders of penis: Secondary | ICD-10-CM

## 2018-10-19 NOTE — Progress Notes (Signed)
Patient ID: Joseph Marsh, male   DOB: 02/03/1946, 73 y.o.   MRN: [id number] This service is provided via telemedicine  No vital signs collected/recorded due to the encounter was a telemedicine visit.   Location of patient (ex: home, work):  HOME  Patient consents to a telephone visit:  YES  Location of the provider (ex: office, home):  OFFICE  Name of any referring provider:  DR [facility] Tilia Faso, DO  Names of all persons participating in the telemedicine service and their role in the encounter:  PATIENT, [name], [name] DO  Time spent on call:  8:18    Provider:  Halei Hanover L. Mariea Clonts, D.O., C.M.D.  Goals of Care:  Advanced Directives 06/19/2018  Does Patient Have a Medical Advance Directive? No  Would patient like information on creating a medical advance directive? Yes (ED - Information included in AVS)  Pre-existing out of facility DNR order (yellow form or pink MOST form) -   Chief Complaint  Patient presents with  . Acute Visit    Bruises on arm    HPI: Patient is a 73 y.o. male spoken with by phone today due to acute concern of bruises on his arm.    He had XRT and seed implants for his prostate cancer.  4-5 years ago, he developed ED and eventually, he got the vacuum system.  Over the last several years, he uses it from time to time to maintain capability.  He's not in a relationship.  He was using it Sunday.  When he removed the restriction ring, he didn't handle it well and he got a "handful of blood" which had never happened before.  Fortunately, it did not continue.  It just happened once.  Sunday evening, after reading the material about the device, he did read that it could be a damaged blood vessel.    He'd worked in the yard Saturday, he didn't have any bruises there.  He didn't do any physical activity Sunday.  Sunday evening, the red spot came up on his mid-left forearm.  The first one was mid-arm and spread out.  It appeared spontaneously w/o trauma,  contact, squeezing--just showed up.  It concerned him b/c he'd had the bleeding of his penis earlier.  He worried he could have a blood clot forming.  When he got up Monday am, the second spot near his elbow showed up--small round spot.  Looked a little different and darker than the other.  There was some redness b/w the spots.  He had some tightness sensation near his elbow.  There are no changes in these areas over days.  In the last day or two, the one near the elbow has lightened and spread out more like the first one.  The other has lightened also and seems to be lessening.    He has not used any ibuprofen lately.  He does not take any aspirin either.    He has had a stye on his left upper eyelid.  He had a similar one last year.  He spoke with his ophthalmologist office about it last week.  They recommended the hot compresses and he's been put on doxycycline hyclate.  He did not start out with a long sleeve shirt on Saturday.    He uses triamcinolone on his hands daily from dermatology.    He is scheduled for an AWV on 5/18 and a f/u on 5/28 with labs on 5/18.  These were postponed b/c of the virus.  Past Medical History:  Diagnosis Date  . Cellulitis   . Eczema   . Hyperlipidemia   . Kidney stone   . Kidney stones   . Plantar fasciitis   . Prostate cancer ([place])   . Right shoulder pain   . Sinusitis   . Spinal stenosis     Past Surgical History:  Procedure Laterality Date  . CHOLECYSTECTOMY N/A 05/24/2013   Procedure: LAPAROSCOPIC CHOLECYSTECTOMY;  Surgeon: Edward Jolly, MD;  Location: [facility];  Service: General;  Laterality: N/A;  . COLONOSCOPY  1996   polp removal  . KNEE SURGERY    . LITHOTRIPSY    . MRI    . sinus cauterization  1970  . TONSILLECTOMY      No Known Allergies  Outpatient Encounter Medications as of 10/19/2018  Medication Sig  . Ascorbic Acid (VITAMIN C) 1000 MG tablet Take 1,000 mg by mouth daily.   Marland Kitchen BIOTIN PO Take 1,000 mg by mouth daily.    . Cholecalciferol (VITAMIN D-3 PO) Take 1,000 mg by mouth daily.   . Glucos-Chondroit-Collag-Hyal (GLUCOSAMINE CHONDROIT-COLLAGEN PO) Take 1 capsule by mouth 2 (two) times daily.   Marland Kitchen ibuprofen (ADVIL,MOTRIN) 200 MG tablet Take 200 mg by mouth as needed.  Marland Kitchen MELATONIN PO Take 8 mg by mouth at bedtime.  . Misc Natural Products (OSTEO BI-FLEX ADV TRIPLE ST) TABS Take by mouth.  . Multiple Vitamin (MULTIVITAMIN) tablet Take 1 tablet by mouth daily.  Marland Kitchen neomycin-bacitracin-polymyxin (NEOSPORIN) ointment Apply 1 application topically as needed for wound care.  . triamcinolone cream (KENALOG) 0.1 % Apply 1 application topically daily.  . Turmeric Curcumin 500 MG CAPS Take by mouth daily.  . [DISCONTINUED] UNABLE TO FIND Med Name: Steroid Ointment for dermatitis   No facility-administered encounter medications on file as of 10/19/2018.     Review of Systems:  Review of Systems  Constitutional: Negative for chills, fever and malaise/fatigue.  Genitourinary:       Erectile dysfunction; bleeding after use of vacuum device as above  Musculoskeletal: Negative for falls.       No injury  Skin:       Places on arms as in photos reviewed in mychart--see hpi for details  Endo/Heme/Allergies: Does not bruise/bleed easily.       Not historically    Health Maintenance  Topic Date Due  . Hepatitis C Screening  08-06-1945  . INFLUENZA VACCINE  01/13/2019  . TETANUS/TDAP  02/28/2021  . COLONOSCOPY  04/14/2022  . PNA vac Low Risk Adult  Completed    Physical Exam: Could not be performed as visit non face-to-face via phone   Labs reviewed: Basic Metabolic Panel: Recent Labs    12/30/17 0843  NA 139  K 4.2  BUN 17  CREATININE 0.9   Liver Function Tests: Recent Labs    12/30/17 0843  AST 18  ALT 22   No results for input(s): LIPASE, AMYLASE in the last 8760 hours. No results for input(s): AMMONIA in the last 8760 hours. CBC: No results for input(s): WBC, NEUTROABS, HGB, HCT, MCV, PLT  in the last 8760 hours. Lipid Panel: Recent Labs    12/30/17 0843  CHOL 174  HDL 140*  LDLCALC 109  TRIG 153   Lab Results  Component Value Date   HGBA1C 6.2 12/30/2017    Procedures since last visit: No results found.  Assessment/Plan 1. Drug-induced photosensitivity -suspect this is the most likely explanation for the spots on his arms -he is on doxycycline  for his stye as prescribed by ophtho -counseled on covering his skin when outdoors to prevent recurrence -also explained that the spots could be senile purpura and what they are -should this become more pronounced or he has bruising or bleeding other places, we'll need to check his cbc with diff sooner than 5/18  2. Penile bleeding -due to vacuum pump restriction ring  -did resolve by his report  3. Dermatitis -continue triamcinolone per dermatology  Labs/tests ordered:  Keep as scheduled Next appt:  10/30/2018 AWV; then sees me 5/28 Non face-to-face time spent on televisit:  29 minutes  Aurore Redinger L. Jevonte Clanton, D.O. [facility] [address], [place] [address] Cell Phone (Mon-Fri 8am-5pm):  [phone] On Call:  [phone] & follow prompts after 5pm & weekends Office Phone:  [phone] Office Fax:  [phone]

## 2018-10-27 ENCOUNTER — Other Ambulatory Visit: Payer: Self-pay

## 2018-10-27 DIAGNOSIS — R739 Hyperglycemia, unspecified: Secondary | ICD-10-CM

## 2018-10-27 DIAGNOSIS — C61 Malignant neoplasm of prostate: Secondary | ICD-10-CM

## 2018-10-27 DIAGNOSIS — E7800 Pure hypercholesterolemia, unspecified: Secondary | ICD-10-CM

## 2018-10-27 DIAGNOSIS — R0981 Nasal congestion: Secondary | ICD-10-CM

## 2018-10-30 ENCOUNTER — Encounter: Payer: Self-pay | Admitting: Nurse Practitioner

## 2018-10-30 ENCOUNTER — Other Ambulatory Visit: Payer: Self-pay

## 2018-10-30 ENCOUNTER — Encounter: Payer: Medicare Other | Admitting: Nurse Practitioner

## 2018-10-30 ENCOUNTER — Ambulatory Visit (INDEPENDENT_AMBULATORY_CARE_PROVIDER_SITE_OTHER): Payer: Medicare Other | Admitting: Nurse Practitioner

## 2018-10-30 VITALS — BP 138/70 | HR 76 | Temp 98.1°F | Ht 69.5 in | Wt 173.0 lb

## 2018-10-30 DIAGNOSIS — E7800 Pure hypercholesterolemia, unspecified: Secondary | ICD-10-CM | POA: Diagnosis not present

## 2018-10-30 DIAGNOSIS — Z0000 Encounter for general adult medical examination without abnormal findings: Secondary | ICD-10-CM

## 2018-10-30 DIAGNOSIS — C61 Malignant neoplasm of prostate: Secondary | ICD-10-CM | POA: Diagnosis not present

## 2018-10-30 DIAGNOSIS — R739 Hyperglycemia, unspecified: Secondary | ICD-10-CM | POA: Diagnosis not present

## 2018-10-30 DIAGNOSIS — R0981 Nasal congestion: Secondary | ICD-10-CM | POA: Diagnosis not present

## 2018-10-30 NOTE — Patient Instructions (Signed)
Mr. Joseph Marsh , Thank you for taking time to come for your Medicare Wellness Visit. I appreciate your ongoing commitment to your health goals. Please review the following plan we discussed and let me know if I can assist you in the future.   Screening recommendations/referrals: Colonoscopy up to date Recommended yearly ophthalmology/optometry visit for glaucoma screening and checkup Recommended yearly dental visit for hygiene and checkup  Vaccinations: Influenza vaccine due 01/2019 Pneumococcal vaccine up to date Tdap vaccine up to date Shingles vaccine up to date    Advanced directives: given paperwork, complete and bring back once complete.  Conditions/risks identified: none.  Next appointment: 1 year  Preventive Care 75 Years and Older, Male Preventive care refers to lifestyle choices and visits with your health care provider that can promote health and wellness. What does preventive care include?  A yearly physical exam. This is also called an annual well check.  Dental exams once or twice a year.  Routine eye exams. Ask your health care provider how often you should have your eyes checked.  Personal lifestyle choices, including:  Daily care of your teeth and gums.  Regular physical activity.  Eating a healthy diet.  Avoiding tobacco and drug use.  Limiting alcohol use.  Practicing safe sex.  Taking low doses of aspirin every day.  Taking vitamin and mineral supplements as recommended by your health care provider. What happens during an annual well check? The services and screenings done by your health care provider during your annual well check will depend on your age, overall health, lifestyle risk factors, and family history of disease. Counseling  Your health care provider may ask you questions about your:  Alcohol use.  Tobacco use.  Drug use.  Emotional well-being.  Home and relationship well-being.  Sexual activity.  Eating habits.  History  of falls.  Memory and ability to understand (cognition).  Work and work Statistician. Screening  You may have the following tests or measurements:  Height, weight, and BMI.  Blood pressure.  Lipid and cholesterol levels. These may be checked every 5 years, or more frequently if you are over 64 years old.  Skin check.  Lung cancer screening. You may have this screening every year starting at age 70 if you have a 30-pack-year history of smoking and currently smoke or have quit within the past 15 years.  Fecal occult blood test (FOBT) of the stool. You may have this test every year starting at age 44.  Flexible sigmoidoscopy or colonoscopy. You may have a sigmoidoscopy every 5 years or a colonoscopy every 10 years starting at age 61.  Prostate cancer screening. Recommendations will vary depending on your family history and other risks.  Hepatitis C blood test.  Hepatitis B blood test.  Sexually transmitted disease (STD) testing.  Diabetes screening. This is done by checking your blood sugar (glucose) after you have not eaten for a while (fasting). You may have this done every 1-3 years.  Abdominal aortic aneurysm (AAA) screening. You may need this if you are a current or former smoker.  Osteoporosis. You may be screened starting at age 38 if you are at high risk. Talk with your health care provider about your test results, treatment options, and if necessary, the need for more tests. Vaccines  Your health care provider may recommend certain vaccines, such as:  Influenza vaccine. This is recommended every year.  Tetanus, diphtheria, and acellular pertussis (Tdap, Td) vaccine. You may need a Td booster every 10 years.  Zoster vaccine. You may need this after age 34.  Pneumococcal 13-valent conjugate (PCV13) vaccine. One dose is recommended after age 33.  Pneumococcal polysaccharide (PPSV23) vaccine. One dose is recommended after age 34. Talk to your health care provider  about which screenings and vaccines you need and how often you need them. This information is not intended to replace advice given to you by your health care provider. Make sure you discuss any questions you have with your health care provider. Document Released: 06/27/2015 Document Revised: 02/18/2016 Document Reviewed: 04/01/2015 Elsevier Interactive Patient Education  2017 [place] Prevention in the Home Falls can cause injuries. They can happen to people of all ages. There are many things you can do to make your home safe and to help prevent falls. What can I do on the outside of my home?  Regularly fix the edges of walkways and driveways and fix any cracks.  Remove anything that might make you trip as you walk through a door, such as a raised step or threshold.  Trim any bushes or trees on the path to your home.  Use bright outdoor lighting.  Clear any walking paths of anything that might make someone trip, such as rocks or tools.  Regularly check to see if handrails are loose or broken. Make sure that both sides of any steps have handrails.  Any raised decks and porches should have guardrails on the edges.  Have any leaves, snow, or ice cleared regularly.  Use sand or salt on walking paths during winter.  Clean up any spills in your garage right away. This includes oil or grease spills. What can I do in the bathroom?  Use night lights.  Install grab bars by the toilet and in the tub and shower. Do not use towel bars as grab bars.  Use non-skid mats or decals in the tub or shower.  If you need to sit down in the shower, use a plastic, non-slip stool.  Keep the floor dry. Clean up any water that spills on the floor as soon as it happens.  Remove soap buildup in the tub or shower regularly.  Attach bath mats securely with double-sided non-slip rug tape.  Do not have throw rugs and other things on the floor that can make you trip. What can I do in the  bedroom?  Use night lights.  Make sure that you have a light by your bed that is easy to reach.  Do not use any sheets or blankets that are too big for your bed. They should not hang down onto the floor.  Have a firm chair that has side arms. You can use this for support while you get dressed.  Do not have throw rugs and other things on the floor that can make you trip. What can I do in the kitchen?  Clean up any spills right away.  Avoid walking on wet floors.  Keep items that you use a lot in easy-to-reach places.  If you need to reach something above you, use a strong step stool that has a grab bar.  Keep electrical cords out of the way.  Do not use floor polish or wax that makes floors slippery. If you must use wax, use non-skid floor wax.  Do not have throw rugs and other things on the floor that can make you trip. What can I do with my stairs?  Do not leave any items on the stairs.  Make sure that there are  handrails on both sides of the stairs and use them. Fix handrails that are broken or loose. Make sure that handrails are as long as the stairways.  Check any carpeting to make sure that it is firmly attached to the stairs. Fix any carpet that is loose or worn.  Avoid having throw rugs at the top or bottom of the stairs. If you do have throw rugs, attach them to the floor with carpet tape.  Make sure that you have a light switch at the top of the stairs and the bottom of the stairs. If you do not have them, ask someone to add them for you. What else can I do to help prevent falls?  Wear shoes that:  Do not have high heels.  Have rubber bottoms.  Are comfortable and fit you well.  Are closed at the toe. Do not wear sandals.  If you use a stepladder:  Make sure that it is fully opened. Do not climb a closed stepladder.  Make sure that both sides of the stepladder are locked into place.  Ask someone to hold it for you, if possible.  Clearly mark and make  sure that you can see:  Any grab bars or handrails.  First and last steps.  Where the edge of each step is.  Use tools that help you move around (mobility aids) if they are needed. These include:  Canes.  Walkers.  Scooters.  Crutches.  Turn on the lights when you go into a dark area. Replace any light bulbs as soon as they burn out.  Set up your furniture so you have a clear path. Avoid moving your furniture around.  If any of your floors are uneven, fix them.  If there are any pets around you, be aware of where they are.  Review your medicines with your doctor. Some medicines can make you feel dizzy. This can increase your chance of falling. Ask your doctor what other things that you can do to help prevent falls. This information is not intended to replace advice given to you by your health care provider. Make sure you discuss any questions you have with your health care provider. Document Released: 03/27/2009 Document Revised: 11/06/2015 Document Reviewed: 07/05/2014 Elsevier Interactive Patient Education  2017 [employer].

## 2018-10-30 NOTE — Progress Notes (Signed)
Subjective:   Joseph Marsh is a 73 y.o. male who presents for Medicare Annual/Subsequent preventive examination.  Review of Systems:   Cardiac Risk Factors include: advanced age (>80men, >54 women);male gender;dyslipidemia;family history of premature cardiovascular disease     Objective:    Vitals: BP 138/70 (BP Location: Right Arm, Patient Position: Sitting, Cuff Size: Normal)    Pulse 76    Temp 98.1 F (36.7 C) (Oral)    Ht 5' 9.5" (1.765 m)    Wt 173 lb (78.5 kg)    SpO2 98%    BMI 25.18 kg/m   Body mass index is 25.18 kg/m.  Advanced Directives 10/30/2018 06/19/2018 01/22/2017 07/28/2015 05/24/2013 05/24/2013  Does Patient Have a Medical Advance Directive? No No No No Patient does not have advance directive Patient does not have advance directive;Patient would not like information  Would patient like information on creating a medical advance directive? No - Patient declined Yes (ED - Information included in AVS) - - - -  Pre-existing out of facility DNR order (yellow form or pink MOST form) - - - - No No    Tobacco Social History   Tobacco Use  Smoking Status Never Smoker  Smokeless Tobacco Never Used     Counseling given: Not Answered   Clinical Intake:  Pre-visit preparation completed: Yes  Pain : No/denies pain     BMI - recorded: 25.18 Nutritional Status: BMI 25 -29 Overweight Diabetes: No  How often do you need to have someone help you when you read instructions, pamphlets, or other written materials from your doctor or pharmacy?: 1 - Never What is the last grade level you completed in school?: MBA  Interpreter Needed?: No     Past Medical History:  Diagnosis Date   Cellulitis    Eczema    Hyperlipidemia    Kidney stone    Kidney stones    Plantar fasciitis    Prostate cancer ([place])    Right shoulder pain    Sinusitis    Spinal stenosis    Past Surgical History:  Procedure Laterality Date   CHOLECYSTECTOMY N/A 05/24/2013   Procedure: LAPAROSCOPIC CHOLECYSTECTOMY;  Surgeon: Edward Jolly, MD;  Location: [facility];  Service: General;  Laterality: N/A;   COLONOSCOPY  1996   polp removal   KNEE SURGERY     LITHOTRIPSY     MRI     sinus cauterization  1970   TONSILLECTOMY     Family History  Problem Relation Age of Onset   Melanoma Brother    Heart disease Father        died from. Cerebral Hemorrage, smoker and ETOH    Other Mother        Organ Failure, no specified per [facility] new patient packet    Osteoporosis Sister    Diverticulitis Sister    Thyroid disease Sister    Breast cancer Sister    Diverticulitis Sister    Depression Son    Alcoholism Son    Pneumonia Nephew    Social History   Socioeconomic History   Marital status: Divorced    Spouse name: Not on file   Number of children: 2   Years of education: Not on file   Highest education level: Not on file  Occupational History    Employer: [employer]  Social Needs   Financial resource strain: Not on file   Food insecurity:    Worry: Not on file    Inability: Not on  file   Transportation needs:    Medical: Not on file    Non-medical: Not on file  Tobacco Use   Smoking status: Never Smoker   Smokeless tobacco: Never Used  Substance and Sexual Activity   Alcohol use: No   Drug use: No   Sexual activity: Yes  Lifestyle   Physical activity:    Days per week: Not on file    Minutes per session: Not on file   Stress: Not on file  Relationships   Social connections:    Talks on phone: Not on file    Gets together: Not on file    Attends religious service: Not on file    Active member of club or organization: Not on file    Attends meetings of clubs or organizations: Not on file    Relationship status: Not on file  Other Topics Concern   Not on file  Social History Narrative   As of 06/19/2018 [employer] [facility] New Patient Packet:      Diet: Fruits, nuts, protein, carbohydrates, sodium, fats, veggies, and  some seafood       Caffeine: Average 5 12oz sodas per week       Married, if yes what year: Divorced x 2, married 1970-1999 (1st time) and 2004-2016 (second time)       Do you live in a house, apartment, assisted living, condo, trailer, ect: 3 bed room house, 1 stories, one person       Pets: No      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 No   Do you have difficulty preparing food or eating? No   Do you have difficulty managing your medications? No   Do you have difficulty managing your finances? No   Do you have difficulty affording your medications? No    Outpatient Encounter Medications as of 10/30/2018  Medication Sig   acetaminophen (TYLENOL) 500 MG tablet Take 500 mg by mouth as needed.   Ascorbic Acid (VITAMIN C) 1000 MG tablet Take 1,000 mg by mouth daily.    BIOTIN PO Take 1,000 mg by mouth daily.    Cholecalciferol (VITAMIN D-3 PO) Take 1,000 mg by mouth daily.    Glucos-Chondroit-Collag-Hyal (GLUCOSAMINE CHONDROIT-COLLAGEN PO) Take 1 capsule by mouth 2 (two) times daily.    MELATONIN PO Take 8 mg by mouth at bedtime.   Multiple Vitamin (MULTIVITAMIN) tablet Take 1 tablet by mouth daily.   neomycin-bacitracin-polymyxin (NEOSPORIN) ointment Apply 1 application topically as needed for wound care.   Turmeric Curcumin 500 MG CAPS Take by mouth daily.   [DISCONTINUED] ibuprofen (ADVIL,MOTRIN) 200 MG tablet Take 200 mg by mouth as needed.   [DISCONTINUED] Misc Natural Products (OSTEO BI-FLEX ADV TRIPLE ST) TABS Take by mouth.   [DISCONTINUED] triamcinolone cream (KENALOG) 0.1 % Apply 1 application topically daily.   No facility-administered encounter medications on file as of 10/30/2018.     Activities of Daily Living In  your present state of health, do you have any difficulty performing the following activities: 10/30/2018  Hearing? Y  Comment screened positive for hearing loss but no day to day functional difficulty  Vision? N  Difficulty concentrating or making decisions? N  Walking or climbing stairs? N  Dressing or bathing? N  Doing errands, shopping? N  Preparing Food and eating ? N  Using the Toilet? N  In the past six months, have you accidently leaked urine? N  Do you have problems with loss of bowel control? N  Managing your Medications? N  Managing your Finances? N  Housekeeping or managing your Housekeeping? N  Some recent data might be hidden    Patient Care Team: Gayland Curry, DO as PCP - General (Geriatric Medicine) Gaynelle Arabian, MD as Consulting Physician (Orthopedic Surgery) Jarome Matin, MD as Consulting Physician (Dermatology) Raynelle Bring, MD as Consulting Physician (Urology)   Assessment:   This is a routine wellness examination for Joseph Marsh.  Exercise Activities and Dietary recommendations Current Exercise Habits: The patient does not participate in regular exercise at present  Goals     Exercise 3x per week (30 min per time)     More exercise year round     Patient Stated     To deal with recently increase increase in side effects from prior prostate cancer.        Fall Risk Fall Risk  10/30/2018 10/19/2018 06/19/2018  Falls in the past year? 0 0 0  Number falls in past yr: 0 0 0  Injury with Fall? 0 0 0   Is the patient's home free of loose throw rugs in walkways, pet beds, electrical cords, etc?   yes      Grab bars in the bathroom? no      Handrails on the stairs?   yes      Adequate lighting?   yes  Timed Get Up and Go Performed: n/a  Depression Screen PHQ 2/9 Scores 10/30/2018 10/19/2018  PHQ - 2 Score 0 0    Cognitive Function MMSE - Mini Mental State Exam 10/30/2018  Orientation to time 5  Orientation to Place 5  Registration 3  Attention/  Calculation 5  Recall 0  Language- name 2 objects 2  Language- repeat 1  Language- follow 3 step command 3  Language- read & follow direction 1  Write a sentence 1  Copy design 1  Total score 27        Immunization History  Administered Date(s) Administered   Influenza-Unspecified 04/16/2013, 06/24/2014, 06/19/2015, 06/28/2016, 04/14/2017, 03/14/2018, 05/19/2018   Pneumococcal Conjugate-13 07/23/2014   Pneumococcal Polysaccharide-23 12/18/2009   Pneumococcal-Unspecified 06/14/2016   Tdap 03/01/2011   Zoster 03/01/2011    Qualifies for Shingles Vaccine? Yes   Screening Tests Health Maintenance  Topic Date Due   Hepatitis C Screening  1945-10-06   INFLUENZA VACCINE  01/13/2019   TETANUS/TDAP  02/28/2021   COLONOSCOPY  04/14/2022   PNA vac Low Risk Adult  Completed   Cancer Screenings: Lung: Low Dose CT Chest recommended if Age 19-80 years, 30 pack-year currently smoking OR have quit w/in 15years. Patient does not qualify. Colorectal: up to date  Additional Screenings:  Hepatitis C Screening: declines at this time.       Plan:     I have personally reviewed and noted the following in the patients chart:    Medical and social history  Use of alcohol, tobacco or illicit drugs   Current medications and supplements  Functional ability and status  Nutritional status  Physical activity  Advanced directives  List of other physicians  Hospitalizations, surgeries, and ER visits in previous 12 months  Vitals  Screenings to include cognitive, depression, and falls  Referrals and appointments  In addition, I have reviewed and discussed with patient certain preventive protocols, quality metrics, and best practice recommendations. A written personalized care plan for preventive services as well as general preventive health recommendations were provided to patient.     Lauree Chandler, NP  10/30/2018

## 2018-10-31 LAB — COMPLETE METABOLIC PANEL WITH GFR
AG Ratio: 2 (calc) (ref 1.0–2.5)
ALT: 25 U/L (ref 9–46)
AST: 21 U/L (ref 10–35)
Albumin: 4.5 g/dL (ref 3.6–5.1)
Alkaline phosphatase (APISO): 89 U/L (ref 35–144)
BUN/Creatinine Ratio: 31 (calc) — ABNORMAL HIGH (ref 6–22)
BUN: 27 mg/dL — ABNORMAL HIGH (ref 7–25)
CO2: 31 mmol/L (ref 20–32)
Calcium: 9.7 mg/dL (ref 8.6–10.3)
Chloride: 103 mmol/L (ref 98–110)
Creat: 0.88 mg/dL (ref 0.70–1.18)
GFR, Est African American: 98 mL/min/{1.73_m2} (ref >=60)
GFR, Est Non African American: 85 mL/min/{1.73_m2} (ref >=60)
Globulin: 2.3 g/dL (calc) (ref 1.9–3.7)
Glucose, Bld: 109 mg/dL — ABNORMAL HIGH (ref 65–99)
Potassium: 4.4 mmol/L (ref 3.5–5.3)
Sodium: 140 mmol/L (ref 135–146)
Total Bilirubin: 0.6 mg/dL (ref 0.2–1.2)
Total Protein: 6.8 g/dL (ref 6.1–8.1)

## 2018-10-31 LAB — CBC WITH DIFFERENTIAL/PLATELET
Absolute Monocytes: 504 cells/uL (ref 200–950)
Basophils Absolute: 22 cells/uL (ref 0–200)
Basophils Relative: 0.4 %
Eosinophils Absolute: 39 cells/uL (ref 15–500)
Eosinophils Relative: 0.7 %
HCT: 43.1 % (ref 38.5–50.0)
Hemoglobin: 14.5 g/dL (ref 13.2–17.1)
Lymphs Abs: 974 cells/uL (ref 850–3900)
MCH: 29.5 pg (ref 27.0–33.0)
MCHC: 33.6 g/dL (ref 32.0–36.0)
MCV: 87.6 fL (ref 80.0–100.0)
MPV: 9.7 fL (ref 7.5–12.5)
Monocytes Relative: 9 %
Neutro Abs: 4060 cells/uL (ref 1500–7800)
Neutrophils Relative %: 72.5 %
Platelets: 208 10*3/uL (ref 140–400)
RBC: 4.92 10*6/uL (ref 4.20–5.80)
RDW: 13.2 % (ref 11.0–15.0)
Total Lymphocyte: 17.4 %
WBC: 5.6 10*3/uL (ref 3.8–10.8)

## 2018-10-31 LAB — HEMOGLOBIN A1C
Hgb A1c MFr Bld: 6 % of total Hgb — ABNORMAL HIGH (ref <5.7)
Mean Plasma Glucose: 126 (calc)
eAG (mmol/L): 7 (calc)

## 2018-10-31 LAB — LIPID PANEL
Cholesterol: 170 mg/dL (ref <200)
HDL: 38 mg/dL — ABNORMAL LOW (ref >=40)
LDL Cholesterol (Calc): 108 mg/dL (calc) — ABNORMAL HIGH
Non-HDL Cholesterol (Calc): 132 mg/dL (calc) — ABNORMAL HIGH (ref <130)
Total CHOL/HDL Ratio: 4.5 (calc) (ref <5.0)
Triglycerides: 129 mg/dL (ref <150)

## 2018-11-09 ENCOUNTER — Encounter: Payer: Self-pay | Admitting: Internal Medicine

## 2018-11-09 ENCOUNTER — Ambulatory Visit (INDEPENDENT_AMBULATORY_CARE_PROVIDER_SITE_OTHER): Payer: Medicare Other | Admitting: Internal Medicine

## 2018-11-09 ENCOUNTER — Other Ambulatory Visit: Payer: Self-pay

## 2018-11-09 DIAGNOSIS — N4889 Other specified disorders of penis: Secondary | ICD-10-CM | POA: Diagnosis not present

## 2018-11-09 DIAGNOSIS — L568 Other specified acute skin changes due to ultraviolet radiation: Secondary | ICD-10-CM

## 2018-11-09 DIAGNOSIS — C61 Malignant neoplasm of prostate: Secondary | ICD-10-CM

## 2018-11-09 DIAGNOSIS — E7800 Pure hypercholesterolemia, unspecified: Secondary | ICD-10-CM

## 2018-11-09 DIAGNOSIS — R739 Hyperglycemia, unspecified: Secondary | ICD-10-CM | POA: Diagnosis not present

## 2018-11-09 DIAGNOSIS — T50905A Adverse effect of unspecified drugs, medicaments and biological substances, initial encounter: Secondary | ICD-10-CM

## 2018-11-09 NOTE — Progress Notes (Signed)
Patient ID: Joseph Marsh, male   DOB: 10-10-1945, 73 y.o.   MRN: [id number] This service is provided via telemedicine  No vital signs collected/recorded due to the encounter was a telemedicine visit.   Location of patient (ex: home, work):  HOME  Patient consents to a telephone visit: YES  Location of the provider (ex: office, home):  OFFICE  Name of any referring provider:  Jaxon Flatt, DO  Names of all persons participating in the telemedicine service and their role in the encounter:  PATIENT, Joseph Marsh, Joseph Marsh. Joseph Freas, DO  Time spent on call:  3:27    Provider:  Ladeidra Borys L. Mariea Marsh, D.O., C.M.D.  Code Status: does not want to discuss this at this time Goals of Care:  Advanced Directives 10/30/2018  Does Patient Have a Medical Advance Directive? No  Would patient like information on creating a medical advance directive? No - Patient declined  Pre-existing out of facility DNR order (yellow form or pink MOST form) -   Chief Complaint  Patient presents with  . Medical Management of Chronic Issues    43mth follow-up    HPI: Patient is a 73 y.o. male seen today for medical management of chronic diseases.    He's doing ok in most respects.  He is not ready to do his living will, health care poa documents.    The places on his arms are now hardly noticeable on the left arm.  He had his labs Monday a week ago--he still has a bruise from that.    Knees bother him.  He stepped off a high bench and had a jolt in his left knee at his son's basketball game.  It was a little painful afterward and it bugs him a little more since.   Should also bothers him the most.  He does not take any medications for pain.    Prediabetes:  hba1c elevated, but lower than it's run before.  Cholesterol:  LDL was 108 and goal 100 or less.    He says he's probably the most depressed he's ever been in his life, but does not need a Social worker.  He admits to loneliness and isolation related to the  covid.  Hasn't seen his grandchildren since pre-covid.    He asks if he should f/u with Dr. Alinda Money about his pump malfunction concern--I advised yes.    Weight is down a few lbs.  The previous one was in January when he was less active than lately.  He's generally been in 170s though a long time.  It was not intentional.  Says he wouldn't mind losing a little around the middle, but wasn't pursuing any particular goal.  Is working almost every weekend in the yard.  He is still working full time.  Has a push mower.  He's considering adding the stationary bike to his regimen.    He's been in [place] for 21 years rather than 10-11.    Past Medical History:  Diagnosis Date  . Cellulitis   . Eczema   . Hyperlipidemia   . Kidney stone   . Kidney stones   . Plantar fasciitis   . Prostate cancer ([place])   . Right shoulder pain   . Sinusitis   . Spinal stenosis     Past Surgical History:  Procedure Laterality Date  . CHOLECYSTECTOMY N/A 05/24/2013   Procedure: LAPAROSCOPIC CHOLECYSTECTOMY;  Surgeon: Edward Jolly, MD;  Location: [facility];  Service: General;  Laterality: N/A;  . COLONOSCOPY  1996   polp removal  . KNEE SURGERY    . LITHOTRIPSY    . MRI    . sinus cauterization  1970  . TONSILLECTOMY      No Known Allergies  Outpatient Encounter Medications as of 11/09/2018  Medication Sig  . acetaminophen (TYLENOL) 500 MG tablet Take 500 mg by mouth as needed.  . Ascorbic Acid (VITAMIN C) 1000 MG tablet Take 1,000 mg by mouth daily.   Marland Kitchen BIOTIN PO Take 1,000 mg by mouth daily.   . Cholecalciferol (VITAMIN D-3 PO) Take 1,000 mg by mouth daily.   . Glucos-Chondroit-Collag-Hyal (GLUCOSAMINE CHONDROIT-COLLAGEN PO) Take 1 capsule by mouth 2 (two) times daily.   Marland Kitchen MELATONIN PO Take 8 mg by mouth at bedtime.  . Multiple Vitamin (MULTIVITAMIN) tablet Take 1 tablet by mouth daily.  Marland Kitchen neomycin-bacitracin-polymyxin (NEOSPORIN) ointment Apply 1 application topically as needed for wound  care.  . Turmeric Curcumin 500 MG CAPS Take by mouth daily.   No facility-administered encounter medications on file as of 11/09/2018.     Review of Systems:  Review of Systems  Constitutional: Negative for chills, fever and malaise/fatigue.  Respiratory: Negative for shortness of breath.   Cardiovascular: Negative for chest pain, palpitations and leg swelling.  Gastrointestinal: Negative for abdominal pain, blood in stool, constipation, diarrhea and melena.  Genitourinary: Negative for dysuria.  Musculoskeletal: Positive for joint pain. Negative for falls.  Skin:       Places on his arms are nearly gone (doxy photosensitivity)  Neurological: Negative for dizziness and loss of consciousness.  Endo/Heme/Allergies:       Prediabetes  Psychiatric/Behavioral: Negative for depression and memory loss. The patient is not nervous/anxious and does not have insomnia.     Health Maintenance  Topic Date Due  . Hepatitis C Screening  [birth year]  . INFLUENZA VACCINE  01/13/2019  . TETANUS/TDAP  02/28/2021  . COLONOSCOPY  04/14/2022  . PNA vac Low Risk Adult  Completed    Physical Exam: Could not be performed as visit non face-to-face via phone   Labs reviewed: Basic Metabolic Panel: Recent Labs    12/30/17 0843 10/30/18 0859  NA 139 140  K 4.2 4.4  CL  --  103  CO2  --  31  GLUCOSE  --  109*  BUN 17 27*  CREATININE 0.9 0.88  CALCIUM  --  9.7   Liver Function Tests: Recent Labs    12/30/17 0843 10/30/18 0859  AST 18 21  ALT 22 25  BILITOT  --  0.6  PROT  --  6.8   No results for input(s): LIPASE, AMYLASE in the last 8760 hours. No results for input(s): AMMONIA in the last 8760 hours. CBC: Recent Labs    10/30/18 0859  WBC 5.6  NEUTROABS 4,060  HGB 14.5  HCT 43.1  MCV 87.6  PLT 208   Lipid Panel: Recent Labs    12/30/17 0843 10/30/18 0859  CHOL 174 170  HDL 140* 38*  LDLCALC 109 108*  TRIG 153 129  CHOLHDL  --  4.5   Lab Results  Component Value  Date   HGBA1C 6.0 (H) 10/30/2018   Assessment/Plan 1. Hyperglycemia -prediabetic range hba1c -encouraged healthy diet and exercise program to prevent worsening; he has lost a few lbs lately  2. Pure hypercholesterolemia -LDL slightly above goal -is active and tries to eat a healthy balanced diet--encouraged him to continue this; prefers to avoid meds and only minimal elevation  3. Prostate CA ([place]) -  prior treatments and subsequent ED--doing well outside of this after effect  4. Drug-induced photosensitivity -from doxycycline--resolving  5. Penile bleeding -due to malfunctioned vacuum pump--resolved  Labs/tests ordered:  No orders of the defined types were placed in this encounter.   Next appt: 05/03/2019 Non face-to-face time spent on televisit:  45 mins  Careen Mauch L. Villa Burgin, D.O. [facility] [address], [place] [address] Cell Phone (Mon-Fri 8am-5pm):  [phone] On Call:  [phone] & follow prompts after 5pm & weekends Office Phone:  [phone] Office Fax:  [phone]

## 2018-11-28 DIAGNOSIS — R361 Hematospermia: Secondary | ICD-10-CM | POA: Diagnosis not present

## 2019-01-04 DIAGNOSIS — H00022 Hordeolum internum right lower eyelid: Secondary | ICD-10-CM | POA: Diagnosis not present

## 2019-02-07 DIAGNOSIS — C44329 Squamous cell carcinoma of skin of other parts of face: Secondary | ICD-10-CM | POA: Diagnosis not present

## 2019-02-07 DIAGNOSIS — L821 Other seborrheic keratosis: Secondary | ICD-10-CM | POA: Diagnosis not present

## 2019-02-07 DIAGNOSIS — Q825 Congenital non-neoplastic nevus: Secondary | ICD-10-CM | POA: Diagnosis not present

## 2019-02-07 DIAGNOSIS — C4441 Basal cell carcinoma of skin of scalp and neck: Secondary | ICD-10-CM | POA: Diagnosis not present

## 2019-02-07 DIAGNOSIS — L308 Other specified dermatitis: Secondary | ICD-10-CM | POA: Diagnosis not present

## 2019-02-07 DIAGNOSIS — D224 Melanocytic nevi of scalp and neck: Secondary | ICD-10-CM | POA: Diagnosis not present

## 2019-02-07 DIAGNOSIS — D485 Neoplasm of uncertain behavior of skin: Secondary | ICD-10-CM | POA: Diagnosis not present

## 2019-02-08 DIAGNOSIS — H25013 Cortical age-related cataract, bilateral: Secondary | ICD-10-CM | POA: Diagnosis not present

## 2019-02-08 DIAGNOSIS — H40013 Open angle with borderline findings, low risk, bilateral: Secondary | ICD-10-CM | POA: Diagnosis not present

## 2019-02-08 DIAGNOSIS — H00022 Hordeolum internum right lower eyelid: Secondary | ICD-10-CM | POA: Diagnosis not present

## 2019-02-08 DIAGNOSIS — H2513 Age-related nuclear cataract, bilateral: Secondary | ICD-10-CM | POA: Diagnosis not present

## 2019-02-15 DIAGNOSIS — Z8546 Personal history of malignant neoplasm of prostate: Secondary | ICD-10-CM | POA: Diagnosis not present

## 2019-02-16 DIAGNOSIS — Z23 Encounter for immunization: Secondary | ICD-10-CM | POA: Diagnosis not present

## 2019-02-19 ENCOUNTER — Encounter: Payer: Self-pay | Admitting: Gastroenterology

## 2019-02-20 DIAGNOSIS — Z85828 Personal history of other malignant neoplasm of skin: Secondary | ICD-10-CM | POA: Diagnosis not present

## 2019-02-20 DIAGNOSIS — C44329 Squamous cell carcinoma of skin of other parts of face: Secondary | ICD-10-CM | POA: Diagnosis not present

## 2019-02-20 DIAGNOSIS — C4441 Basal cell carcinoma of skin of scalp and neck: Secondary | ICD-10-CM | POA: Diagnosis not present

## 2019-02-23 DIAGNOSIS — Z8546 Personal history of malignant neoplasm of prostate: Secondary | ICD-10-CM | POA: Diagnosis not present

## 2019-02-23 DIAGNOSIS — R361 Hematospermia: Secondary | ICD-10-CM | POA: Diagnosis not present

## 2019-02-23 DIAGNOSIS — N5201 Erectile dysfunction due to arterial insufficiency: Secondary | ICD-10-CM | POA: Diagnosis not present

## 2019-03-06 ENCOUNTER — Emergency Department (HOSPITAL_COMMUNITY)
Admission: EM | Admit: 2019-03-06 | Discharge: 2019-03-06 | Disposition: A | Discharge status: Home / Self Care | Payer: Medicare Other | Attending: Emergency Medicine | Admitting: Emergency Medicine

## 2019-03-06 ENCOUNTER — Other Ambulatory Visit: Payer: Self-pay

## 2019-03-06 ENCOUNTER — Encounter (HOSPITAL_COMMUNITY): Payer: Self-pay | Admitting: Emergency Medicine

## 2019-03-06 DIAGNOSIS — R319 Hematuria, unspecified: Secondary | ICD-10-CM | POA: Diagnosis not present

## 2019-03-06 DIAGNOSIS — R339 Retention of urine, unspecified: Secondary | ICD-10-CM | POA: Diagnosis not present

## 2019-03-06 DIAGNOSIS — Z8546 Personal history of malignant neoplasm of prostate: Secondary | ICD-10-CM | POA: Diagnosis not present

## 2019-03-06 LAB — URINALYSIS, ROUTINE W REFLEX MICROSCOPIC
Bacteria, UA: NONE SEEN
Bilirubin Urine: NEGATIVE
Glucose, UA: NEGATIVE mg/dL
Ketones, ur: NEGATIVE mg/dL
Leukocytes,Ua: NEGATIVE
Nitrite: NEGATIVE
Protein, ur: 30 mg/dL — AB
RBC / HPF: >50 RBC/hpf — ABNORMAL HIGH (ref 0–5)
Specific Gravity, Urine: 1.009 (ref 1.005–1.030)
pH: 6 (ref 5.0–8.0)

## 2019-03-06 NOTE — ED Triage Notes (Signed)
Pt reports having urinary retention for the last several hours. Bladder scan showed greater than 634ml in bladder.

## 2019-03-06 NOTE — ED Provider Notes (Signed)
[facility] DEPT Provider Note   CSN: [id number] Arrival date & time: 03/06/19  [id number]     History   Chief Complaint Chief Complaint  Patient presents with  . Urinary Retention    HPI Joseph Marsh is a 73 y.o. male.     Patient with remote history of prostate cancer, no history of urinary retention, presents unable to pass urine since around 11:00 pm last night (03/05/19). He tried but passed only blood prompting ED evaluation and treatment. No fever, nausea, vomiting, testicular pain or scrotal swelling. Last urination prior to 11:00 was around 6:00 pm.  The history is provided by the patient. No language interpreter was used.    Past Medical History:  Diagnosis Date  . Cellulitis   . Eczema   . Hyperlipidemia   . Kidney stone   . Kidney stones   . Plantar fasciitis   . Prostate cancer ([place])   . Right shoulder pain   . Sinusitis   . Spinal stenosis     Patient Active Problem List   Diagnosis Date Noted  . Dermatitis 06/19/2018  . Hyperglycemia 06/19/2018  . Pure hypercholesterolemia 06/19/2018  . Sinus congestion 06/19/2018  . Right arm pain 06/19/2018  . Plantar fasciitis of right foot 06/19/2018  . Spinal stenosis of lumbar region without neurogenic claudication 06/19/2018  . Cholelithiasis with acute cholecystitis 05/24/2013  . Acute calculous cholecystitis 05/24/2013  . Prostate CA ([place]) 04/05/2012    Past Surgical History:  Procedure Laterality Date  . CHOLECYSTECTOMY N/A 05/24/2013   Procedure: LAPAROSCOPIC CHOLECYSTECTOMY;  Surgeon: Edward Jolly, MD;  Location: [facility];  Service: General;  Laterality: N/A;  . COLONOSCOPY  1996   polp removal  . KNEE SURGERY    . LITHOTRIPSY    . MRI    . sinus cauterization  1970  . TONSILLECTOMY          Home Medications    Prior to Admission medications   Medication Sig Start Date End Date Taking? Authorizing Provider  acetaminophen (TYLENOL) 500 MG tablet Take  500 mg by mouth as needed.    [provider]  Ascorbic Acid (VITAMIN C) 1000 MG tablet Take 1,000 mg by mouth daily.     [provider]  BIOTIN PO Take 1,000 mg by mouth daily.     [provider]  Cholecalciferol (VITAMIN D-3 PO) Take 1,000 mg by mouth daily.     [provider]  Glucos-Chondroit-Collag-Hyal (GLUCOSAMINE CHONDROIT-COLLAGEN PO) Take 1 capsule by mouth 2 (two) times daily.     [provider]  MELATONIN PO Take 8 mg by mouth at bedtime.    [provider]  Multiple Vitamin (MULTIVITAMIN) tablet Take 1 tablet by mouth daily.    [provider]  neomycin-bacitracin-polymyxin (NEOSPORIN) ointment Apply 1 application topically as needed for wound care.    [provider]  Turmeric Curcumin 500 MG CAPS Take by mouth daily.    [provider]    Family History Family History  Problem Relation Age of Onset  . Melanoma Brother   . Heart disease Father        died from. Cerebral Hemorrage, smoker and ETOH   . Other Mother        Organ Failure, no specified per [facility] new patient packet   . Osteoporosis Sister   . Diverticulitis Sister   . Thyroid disease Sister   . Breast cancer Sister   . Diverticulitis Sister   .  Depression Son   . Alcoholism Son   . Pneumonia Nephew     Social History Social History   Tobacco Use  . Smoking status: Never Smoker  . Smokeless tobacco: Never Used  Substance Use Topics  . Alcohol use: No  . Drug use: No     Allergies   Patient has no known allergies.   Review of Systems Review of Systems  Constitutional: Negative for fever.  Gastrointestinal: Negative for anal bleeding, nausea and vomiting.  Genitourinary: Positive for difficulty urinating. Negative for scrotal swelling and testicular pain.       See HPI.  Musculoskeletal: Negative for back pain.  Neurological: Negative for light-headedness.     Physical Exam Updated Vital Signs BP 128/77  (BP Location: Right Arm)   Pulse 70   Temp 97.8 F (36.6 C) (Oral)   Resp 18   Ht 5\' 10"  (1.778 m)   Wt 79.4 kg   SpO2 100%   BMI 25.11 kg/m   Physical Exam Constitutional:      Appearance: He is well-developed.  HENT:     Head: Normocephalic.  Neck:     Musculoskeletal: Normal range of motion and neck supple.  Cardiovascular:     Rate and Rhythm: Normal rate.  Pulmonary:     Effort: Pulmonary effort is normal.  Abdominal:     General: There is no distension.     Palpations: Abdomen is soft.     Tenderness: There is no abdominal tenderness. There is no guarding or rebound.     Comments: Examined after foley placement  Genitourinary:    Scrotum/Testes: Normal.  Musculoskeletal: Normal range of motion.  Skin:    General: Skin is warm and dry.     Findings: No rash.  Neurological:     Mental Status: He is alert.      ED Treatments / Results  Labs (all labs ordered are listed, but only abnormal results are displayed) Labs Reviewed  URINALYSIS, ROUTINE W REFLEX MICROSCOPIC    EKG None  Radiology No results found.  Procedures Procedures (including critical care time)  Medications Ordered in ED Medications - No data to display   Initial Impression / Assessment and Plan / ED Course  I have reviewed the triage vital signs and the nursing notes.  Pertinent labs & imaging results that were available during my care of the patient were reviewed by me and considered in my medical decision making (see chart for details).        Patient to ED unable to urinate since last night (03/05/19) No history of retention.   He is examined after Foley placed by nursing. Per bladder scan, there was 600 cc in bladder prior to catheterization and 37 after.   No infection on UA. VSS. He is discharged home with leg bag and will call his urologist for follow up and re-evaluation.   Final Clinical Impressions(s) / ED Diagnoses   Final diagnoses:  None   1. Urinary  retention 2. Hematuria   ED Discharge Orders    None       Charlann Lange, PA-C 03/06/19 0529    Orpah Greek, MD 03/06/19 [phone]

## 2019-03-06 NOTE — Discharge Instructions (Addendum)
There is no infection in the urine today that might contribute to retention.   Call Dr. Alinda Money in the morning to schedule an appointment this week for recheck and further management of urinary retention.

## 2019-03-08 DIAGNOSIS — R338 Other retention of urine: Secondary | ICD-10-CM | POA: Diagnosis not present

## 2019-03-08 DIAGNOSIS — R310 Gross hematuria: Secondary | ICD-10-CM | POA: Diagnosis not present

## 2019-03-14 DIAGNOSIS — R310 Gross hematuria: Secondary | ICD-10-CM | POA: Diagnosis not present

## 2019-03-14 DIAGNOSIS — N200 Calculus of kidney: Secondary | ICD-10-CM | POA: Diagnosis not present

## 2019-03-16 ENCOUNTER — Other Ambulatory Visit (HOSPITAL_COMMUNITY): Payer: Self-pay | Admitting: Urology

## 2019-03-16 ENCOUNTER — Other Ambulatory Visit: Payer: Self-pay | Admitting: Urology

## 2019-03-16 DIAGNOSIS — D49512 Neoplasm of unspecified behavior of left kidney: Secondary | ICD-10-CM

## 2019-03-26 ENCOUNTER — Other Ambulatory Visit: Payer: Self-pay

## 2019-03-26 ENCOUNTER — Ambulatory Visit (HOSPITAL_COMMUNITY)
Admission: RE | Admit: 2019-03-26 | Discharge: 2019-03-26 | Disposition: A | Discharge status: Home / Self Care | Payer: Medicare Other | Source: Ambulatory Visit | Attending: Urology | Admitting: Urology

## 2019-03-26 DIAGNOSIS — D49512 Neoplasm of unspecified behavior of left kidney: Secondary | ICD-10-CM

## 2019-03-26 DIAGNOSIS — N289 Disorder of kidney and ureter, unspecified: Secondary | ICD-10-CM | POA: Diagnosis not present

## 2019-03-26 MED ORDER — GADOBUTROL 1 MMOL/ML IV SOLN
8.0000 mL | Freq: Once | INTRAVENOUS | Status: AC | PRN
  Administered 2019-03-26: 8 mL via INTRAVENOUS

## 2019-04-06 DIAGNOSIS — N401 Enlarged prostate with lower urinary tract symptoms: Secondary | ICD-10-CM | POA: Diagnosis not present

## 2019-04-06 DIAGNOSIS — R310 Gross hematuria: Secondary | ICD-10-CM | POA: Diagnosis not present

## 2019-04-06 DIAGNOSIS — R338 Other retention of urine: Secondary | ICD-10-CM | POA: Diagnosis not present

## 2019-04-30 ENCOUNTER — Other Ambulatory Visit: Payer: Medicare Other

## 2019-05-03 ENCOUNTER — Ambulatory Visit (INDEPENDENT_AMBULATORY_CARE_PROVIDER_SITE_OTHER): Payer: Medicare Other | Admitting: Internal Medicine

## 2019-05-03 ENCOUNTER — Other Ambulatory Visit: Payer: Self-pay

## 2019-05-03 ENCOUNTER — Encounter: Payer: Self-pay | Admitting: Internal Medicine

## 2019-05-03 VITALS — Ht 70.0 in | Wt 175.0 lb

## 2019-05-03 DIAGNOSIS — N289 Disorder of kidney and ureter, unspecified: Secondary | ICD-10-CM

## 2019-05-03 DIAGNOSIS — K862 Cyst of pancreas: Secondary | ICD-10-CM | POA: Diagnosis not present

## 2019-05-03 DIAGNOSIS — R739 Hyperglycemia, unspecified: Secondary | ICD-10-CM | POA: Diagnosis not present

## 2019-05-03 DIAGNOSIS — E7800 Pure hypercholesterolemia, unspecified: Secondary | ICD-10-CM | POA: Diagnosis not present

## 2019-05-03 DIAGNOSIS — R338 Other retention of urine: Secondary | ICD-10-CM

## 2019-05-03 DIAGNOSIS — N401 Enlarged prostate with lower urinary tract symptoms: Secondary | ICD-10-CM

## 2019-05-03 DIAGNOSIS — Z86010 Personal history of colonic polyps: Secondary | ICD-10-CM

## 2019-05-03 NOTE — Progress Notes (Signed)
This service is provided via telemedicine  No vital signs collected/recorded due to the encounter was a telemedicine visit.   Location of patient (ex: home, work):  Home  Patient consents to a telephone visit:  Yes  Location of the provider (ex: office, home):  [place] office  Name of any referring provider:  Hollace Kinnier, DO  Names of all persons participating in the telemedicine service and their role in the encounter:  Bonney Leitz, [place]; Hollace Kinnier, DO; patient  Time spent on call:  20.45 minutes  CMA time only     Provider:  Raffael Bugarin L. Mariea Clonts, D.O., C.M.D.  Goals of Care:  Advanced Directives 05/03/2019  Does Patient Have a Medical Advance Directive? No  Would patient like information on creating a medical advance directive? -  Pre-existing out of facility DNR order (yellow form or pink MOST form) -     Chief Complaint  Patient presents with  . Medical Management of Chronic Issues    22-month followup  . Quality Metric Gaps    Needs microalbumin  . Best Practice Recommendations    Need hepatitis C screen    HPI: Patient is a 73 y.o. male seen today for medical management of chronic diseases.  He has opted to stay home amid covid-19 peaking.  He says relatively, he is fortunate.    We can consider his urine microalbumin test and hepatitis C screen next visit in office.    Stressful with the stock market.  Continues to work full time though from home.  Has son and grandson in [place] but has not been able to see them this year.  He is also depressed by the political situation in the country.    In May, he was having some difficulty with hematuria rarely with orgasms. It was thought by urology to improve gradually.   He went to the ED 9/21 with an episode of urinary retention.  He was trying to urinate and blood was coming out instead of urine.  It had been from 11pm to 6pm w/o voiding.  Not a stream of blood but some blood and dark clots were passed.    A foley catheter  was placed there after his bladder scan showed 600cc of urine in his bladder--37cc after catheterization.  He was d/c'd home the next morning.  He called Dr. Alinda Money at [facility] and he was seen two days later.  Urine had cleared up mostly by then in the bag.  He had cystoscopy there that confirmed gross hematuria.  The prostate was felt to be enlarged a bit.  He was put on tamsulosin.    He had an MRI of his abd/pelvis done.  1. The small exophytic lesion arising from the lateral cortex of the lower pole of left kidney is again noted and appears unchanged. Enhancement within this lesion is identified on the postcontrast images concerning for a small renal cell carcinoma. 2. Small, milli metric cystic lesion arises from distal tail of pancreas. The appearance is nonspecific but is favored to represent a benign abnormality. Follow-up imaging in 24 months with pancreas protocol MRI without and with contrast material advised. 3. Prior cholecystectomy.  Pt reports the lesions were too small to warrant intervention at this time.  There was also one in his pancreas which is to be followed up in 2 years.  Tamsulosin was then ordered to be continued by urology NP/PA.  He then sees Dr. Alinda Money in 5 more months.  They might consider adding  a second med for him.  He did have some further bleeding a few days after his ED visit.  It then cleared up.  About 3 wks ago (10/25), he had another significant episode of bleeding when trying to urinate.  That was bad enough he though he was retaining urine again.  Contacted urology on call and eventually, he was able to pass urine.  Bleeding cleared up within a day or two.  2.5wks ago, happened again for a day or two.   He had a small amt again 11/3.  It then cleared up the next day and none since.  Urination does now seem more normal.  Discussed that tamsulosin may be starting to work now.   In late August, he had a derm visit and he had a basal cell ca and  squamous cell on top of head and left temple.  9/8, he had surgery to remove those.  Way back in 2011/2012 when he was faced with the prostate ca diagnosis, he read books and had consults with the cancer team, he remembers considering his treatment options.  He'd had external radiation and seed implants in the past and he's wondering if his latest problems are long-term effects.     In early sept, he got a letter from Dr. Havery Moros indicating he was due for colonoscopy.  Not the last time, but a previous colonoscopy did have polyps.  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  He opted to postpone his colonoscopy at that point due to other things going on.    He does not feel his spirits are bad enough to warrant medications like antidepressants.  Left knee does cause discomfort, but he's not requiring any pain medication.  Back is also bothersome at time.  He worries about a surgery need in the long-term.  He does have his son here, but he's busy with his family.    Still has his sinus congestion concerns, but they're a long-term part of his daily life.    Past Medical History:  Diagnosis Date  . Basal cell carcinoma (BCC)   . BPH (benign prostatic hyperplasia)   . Cellulitis   . Eczema   . Hyperlipidemia   . Kidney stone   . Plantar fasciitis   . Prostate cancer ([place])   . Right shoulder pain   . Sinusitis   . Spinal stenosis   . Squamous cell carcinoma in situ     Past Surgical History:  Procedure Laterality Date  . CHOLECYSTECTOMY N/A 05/24/2013   Procedure: LAPAROSCOPIC CHOLECYSTECTOMY;  Surgeon: Edward Jolly, MD;  Location: [facility];  Service: General;  Laterality: N/A;  . COLONOSCOPY  1996   polp removal  . KNEE SURGERY    . LITHOTRIPSY    . MRI    . sinus cauterization  1970  . TONSILLECTOMY      No Known Allergies  Outpatient Encounter Medications as of 05/03/2019  Medication Sig  . acetaminophen (TYLENOL) 500 MG tablet Take 500 mg by mouth as needed.  . Ascorbic  Acid (VITAMIN C) 1000 MG tablet Take 1,000 mg by mouth daily.   Marland Kitchen BIOTIN PO Take 1,000 mcg by mouth daily.   . Cholecalciferol (VITAMIN D-3 PO) Take 1,000 mg by mouth daily.   . Glucos-Chondroit-Collag-Hyal (GLUCOSAMINE CHONDROIT-COLLAGEN PO) Take 1 capsule by mouth 2 (two) times daily.   Marland Kitchen MELATONIN PO Take 8 mg by mouth at bedtime.  . Multiple Vitamin (MULTIVITAMIN) tablet Take 1 tablet by mouth daily.  Marland Kitchen neomycin-bacitracin-polymyxin (  NEOSPORIN) ointment Apply 1 application topically as needed for wound care.  . tamsulosin (FLOMAX) 0.4 MG CAPS capsule Take 0.4 mg by mouth at bedtime. Prescribed by urologist  . triamcinolone ointment (KENALOG) 0.1 % Apply 1 application topically as needed. Uses for dermatitis type issues on his hands  . Turmeric Curcumin 500 MG CAPS Take by mouth daily.   No facility-administered encounter medications on file as of 05/03/2019.     Review of Systems:  Review of Systems  Constitutional: Negative for chills and fever.  HENT: Positive for congestion and sinus pain.   Eyes: Negative for blurred vision.  Respiratory: Negative for cough and shortness of breath.   Cardiovascular: Negative for chest pain, palpitations and leg swelling.  Gastrointestinal: Negative for abdominal pain, blood in stool, constipation, diarrhea and melena.  Genitourinary: Positive for hematuria. Negative for dysuria and flank pain.       Hematospermia  Musculoskeletal: Positive for back pain. Negative for falls and joint pain.  Skin: Negative for itching and rash.       Basal cell and squamous cell  Neurological: Negative for dizziness and loss of consciousness.  Endo/Heme/Allergies: Bruises/bleeds easily.  Psychiatric/Behavioral: Negative for depression and memory loss. The patient is not nervous/anxious and does not have insomnia.        Says his memory is not what it used to be    Health Maintenance  Topic Date Due  . Hepatitis C Screening  [birth year]  . URINE MICROALBUMIN   02/20/1956  . TETANUS/TDAP  02/28/2021  . COLONOSCOPY  04/14/2022  . INFLUENZA VACCINE  Completed  . PNA vac Low Risk Adult  Completed    Physical Exam: Could not be performed as visit non face-to-face via phone   Labs reviewed: Basic Metabolic Panel: Recent Labs    10/30/18 0859  NA 140  K 4.4  CL 103  CO2 31  GLUCOSE 109*  BUN 27*  CREATININE 0.88  CALCIUM 9.7   Liver Function Tests: Recent Labs    10/30/18 0859  AST 21  ALT 25  BILITOT 0.6  PROT 6.8   No results for input(s): LIPASE, AMYLASE in the last 8760 hours. No results for input(s): AMMONIA in the last 8760 hours. CBC: Recent Labs    10/30/18 0859  WBC 5.6  NEUTROABS 4,060  HGB 14.5  HCT 43.1  MCV 87.6  PLT 208   Lipid Panel: Recent Labs    10/30/18 0859  CHOL 170  HDL 38*  LDLCALC 108*  TRIG 129  CHOLHDL 4.5   Lab Results  Component Value Date   HGBA1C 6.0 (H) 10/30/2018   Assessment/Plan 1. Renal lesion -is being followed by Dr. Remi Deter urology about this and does have f/u with him arranged  2. 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 Benign prostatic hyperplasia with urinary retention -cont tamsulosin and keep f/u with Dr. Alinda Money at [facility]  4. History of colon polyps -f/u cscope with Dr. Havery Moros when he feels comfortable to come out for this (amid covid)  5. Pure hypercholesterolemia -f/u flp when seen next  6. Hyperglycemia -f/u hba1c when seen next  Labs/tests ordered:  Need urine microalbumin and hepatitis c screen next time  Next appt:  6 mos  Non face-to-face time spent on televisit:  40 mins   Griselle Rufer L. Apollos Tenbrink, D.O. [employer] [address],  [address] Cell Phone (  Mon-Fri 8am-5pm):  [phone] On Call:  [phone] & follow prompts after 5pm & weekends Office Phone:  [phone] Office Fax:   [phone]

## 2019-05-29 ENCOUNTER — Ambulatory Visit (INDEPENDENT_AMBULATORY_CARE_PROVIDER_SITE_OTHER): Payer: Medicare Other | Admitting: Family

## 2019-05-29 ENCOUNTER — Encounter: Payer: Self-pay | Admitting: Family

## 2019-05-29 ENCOUNTER — Other Ambulatory Visit: Payer: Self-pay

## 2019-05-29 ENCOUNTER — Telehealth: Payer: Self-pay | Admitting: Internal Medicine

## 2019-05-29 VITALS — Temp 99.4°F

## 2019-05-29 DIAGNOSIS — Z20828 Contact with and (suspected) exposure to other viral communicable diseases: Secondary | ICD-10-CM | POA: Diagnosis not present

## 2019-05-29 DIAGNOSIS — R5383 Other fatigue: Secondary | ICD-10-CM | POA: Diagnosis not present

## 2019-05-29 DIAGNOSIS — R509 Fever, unspecified: Secondary | ICD-10-CM | POA: Diagnosis not present

## 2019-05-29 DIAGNOSIS — Z20822 Contact with and (suspected) exposure to covid-19: Secondary | ICD-10-CM

## 2019-05-29 MED ORDER — ZINC GLUCONATE 50 MG PO TABS: 50.0000 mg | ORAL_TABLET | Freq: Every day | ORAL | 0 refills | Status: AC

## 2019-05-29 NOTE — Telephone Encounter (Signed)
See note from NP on call last night.

## 2019-05-29 NOTE — Patient Instructions (Signed)
This information is directly available on the CDC website: https://www.cdc.gov/coronavirus/2019-ncov/if-you-are-sick/steps-when-sick.html    Source:CDC Reference to specific commercial products, manufacturers, companies, or trademarks does not constitute its endorsement or recommendation by the U.S. Government, Department of Health and Human Services, or Centers for Disease Control and Prevention.  

## 2019-05-29 NOTE — Telephone Encounter (Signed)
-----   Message from Nickola Major, NP sent at 05/28/2019 10:44 PM EST ----- Called on-call regarding oral temp 99.1 - 100.7 with chills. Advised to take Tylenol and to call for a televisit tomorrow and if he gets worse , SOB,to go to ER tonight.   FYI.  [place]

## 2019-05-29 NOTE — Progress Notes (Signed)
This service is provided via telemedicine  No vital signs collected/recorded due to the encounter was a telemedicine visit.   Location of patient (ex: home, work): home  Patient consents to a telephone visit:  yes  Location of the provider (ex: office, home):  office  Name of any referring provider:  Hollace Kinnier, D.O   Names of all persons participating in the telemedicine service and their role in the encounter:  Marlowe Sax, NP, Ruthell Rummage CMA, and patient   Time spent on call: Ruthell Rummage CMA spent 10 minutes on phone with patient    Provider: Rolondo Pierre FNP-C  Gayland Curry, DO  Patient Care Team: Gayland Curry, DO as PCP - General (Geriatric Medicine) Gaynelle Arabian, MD as Consulting Physician (Orthopedic Surgery) Jarome Matin, MD as Consulting Physician (Dermatology) Raynelle Bring, MD as Consulting Physician (Urology)  Extended Emergency Contact Information Primary Emergency Contact: Clearence Ped of [place] Phone: [phone] Relation: Son  Code Status: Full Code  Goals of care: Advanced Directive information Advanced Directives 05/03/2019  Does Patient Have a Medical Advance Directive? No  Would patient like information on creating a medical advance directive? -  Pre-existing out of facility DNR order (yellow form or pink MOST form) -     Chief Complaint  Patient presents with  . Acute Visit    Patient c/o of fever, patient states just started yesterday with chills, fever, of  99.4, took it over evening 100.8 of temp.  . Medication Management    patient states he has been taking tyelenol for fever     HPI:  Pt is a 73 y.o. male seen today for an acute visit for evaluation of chills and fever x 2 days.He states his temperature went up to 100.8 yesterday.Also feels fatigue and has some soreness.His Temp today is 99.1 when he woke up this morning he felt shaky but after getting up he felt much better.later had the chills  again temp was 99.4 prior to visit.He called the on call provider and was advised to take some tylenol and follow up with PCP today.He states was instructed to go to the ED if having any shortness of breath. He does not recall being in contact with sick person with COVID-19.He works from home.he recalls one and a half weeks ago he went to the mechanic.he request to pull his car over the pit but the Dealer declined.He states the mechanic was not wearing a mask and he went into his car twice without a mask.He waited in a small lobby which had two doors while his car was being repaired.He wore his facial mask.  He denies any chest pain,tightness or shortness of breath.He does cough once in a while though has had this for a long time.    Past Medical History:  Diagnosis Date  . Basal cell carcinoma (BCC)   . BPH (benign prostatic hyperplasia)   . Cellulitis   . Eczema   . Hyperlipidemia   . Kidney stone   . Plantar fasciitis   . Prostate cancer ([place])   . Right shoulder pain   . Sinusitis   . Spinal stenosis   . Squamous cell carcinoma in situ    Past Surgical History:  Procedure Laterality Date  . CHOLECYSTECTOMY N/A 05/24/2013   Procedure: LAPAROSCOPIC CHOLECYSTECTOMY;  Surgeon: Edward Jolly, MD;  Location: [facility];  Service: General;  Laterality: N/A;  . COLONOSCOPY  1996   polp removal  . KNEE SURGERY    .  LITHOTRIPSY    . MRI    . sinus cauterization  1970  . TONSILLECTOMY      No Known Allergies  Outpatient Encounter Medications as of 05/29/2019  Medication Sig  . acetaminophen (TYLENOL) 500 MG tablet Take 500 mg by mouth as needed.  . Ascorbic Acid (VITAMIN C) 1000 MG tablet Take 1,000 mg by mouth daily.   Marland Kitchen BIOTIN PO Take 1,000 mcg by mouth daily.   . Cholecalciferol (VITAMIN D-3 PO) Take 1,000 mg by mouth daily.   . Glucos-Chondroit-Collag-Hyal (GLUCOSAMINE CHONDROIT-COLLAGEN PO) Take 1 capsule by mouth 2 (two) times daily.   Marland Kitchen MELATONIN PO Take 8 mg by mouth at  bedtime.  . Multiple Vitamin (MULTIVITAMIN) tablet Take 1 tablet by mouth daily.  Marland Kitchen neomycin-bacitracin-polymyxin (NEOSPORIN) ointment Apply 1 application topically as needed for wound care.  . tamsulosin (FLOMAX) 0.4 MG CAPS capsule Take 0.4 mg by mouth at bedtime. Prescribed by urologist  . triamcinolone ointment (KENALOG) 0.1 % Apply 1 application topically as needed. Uses for dermatitis type issues on his hands  . Turmeric Curcumin 500 MG CAPS Take by mouth daily.   No facility-administered encounter medications on file as of 05/29/2019.    Review of Systems  Constitutional: Positive for chills, fatigue and fever. Negative for appetite change.  HENT: Negative for congestion, postnasal drip, rhinorrhea, sinus pressure, sinus pain, sneezing, sore throat and trouble swallowing.   Eyes: Negative for pain, discharge, redness and itching.  Respiratory: Negative for chest tightness, shortness of breath and wheezing.        Chronic cough   Cardiovascular: Negative for chest pain, palpitations and leg swelling.  Gastrointestinal: Negative for abdominal distention, abdominal pain, constipation, diarrhea, nausea and vomiting.  Musculoskeletal: Negative for arthralgias, gait problem and myalgias.  Skin: Negative for color change, pallor and rash.  Neurological: Negative for dizziness, weakness, light-headedness, numbness and headaches.  Psychiatric/Behavioral: Negative for agitation, confusion and sleep disturbance. The patient is not nervous/anxious.     Immunization History  Administered Date(s) Administered  . Influenza, High Dose Seasonal PF 02/15/2019  . Influenza-Unspecified 04/16/2013, 06/24/2014, 06/19/2015, 06/28/2016, 04/14/2017, 03/14/2018, 05/19/2018  . Pneumococcal Conjugate-13 07/23/2014  . Pneumococcal Polysaccharide-23 12/18/2009  . Pneumococcal-Unspecified 06/14/2016  . Tdap 03/01/2011  . Zoster 03/01/2011   Pertinent  Health Maintenance Due  Topic Date Due  . URINE  MICROALBUMIN  06/15/2019 (Originally 02/20/1956)  . COLONOSCOPY  04/14/2022  . INFLUENZA VACCINE  Completed  . PNA vac Low Risk Adult  Completed   Fall Risk  05/03/2019 11/09/2018 10/30/2018 10/19/2018 06/19/2018  Falls in the past year? 0 0 0 0 0  Number falls in past yr: - 0 0 0 0  Injury with Fall? - 0 0 0 0    Vitals:   05/29/19 1319  Temp: 99.4 F (37.4 C)  TempSrc: Oral   There is no height or weight on file to calculate BMI. Physical Exam  Unable to complete on telephone visit.  Labs reviewed: Recent Labs    10/30/18 0859  NA 140  K 4.4  CL 103  CO2 31  GLUCOSE 109*  BUN 27*  CREATININE 0.88  CALCIUM 9.7   Recent Labs    10/30/18 0859  AST 21  ALT 25  BILITOT 0.6  PROT 6.8   Recent Labs    10/30/18 0859  WBC 5.6  NEUTROABS 4,060  HGB 14.5  HCT 43.1  MCV 87.6  PLT 208    Lab Results  Component Value Date   HGBA1C  6.0 (H) 10/30/2018   Lab Results  Component Value Date   CHOL 170 10/30/2018   HDL 38 (L) 10/30/2018   LDLCALC 108 (H) 10/30/2018   TRIG 129 10/30/2018   CHOLHDL 4.5 10/30/2018    Significant Diagnostic Results in last 30 days:  No results found.  Assessment/Plan 1. Fever, unspecified fever cause Highest temp 100.8 took tylenol.temp today 99.4  Continues to have the chills.Suspect possible exposure to COVID-19 will send for testing at the [facility].patient familiar with place.  - Novel Coronavirus, NAA ([facility]); Future  2. Exposure to COVID-19 virus Continue on Vitamin C 1000 mg tablet daily and Vit D 1000 mg daily.will add Zinc supplement. - zinc gluconate 50 MG tablet; Take 1 tablet (50 mg total) by mouth daily for 14 days.  Dispense: 14 tablet; Refill: 0 - Novel Coronavirus, NAA ([facility]); Future - Notified to go to the ED if he develops shortness of breath,wheezingchest pain/tightness or symptoms worsen. - continue facial masking,hand hygiene and social distancing per CDC guidelines.   3. Fatigue, unspecified  type Encouraged to increase fluid intake.get tested for COVID-19 as above.   Family/ staff Communication: Reviewed plan of care with patient   Labs/tests ordered: - Novel Coronavirus, NAA ([facility]); Future  Spent 11 minutes of non-face to face with patient    Sandrea Hughs, NP

## 2019-05-29 NOTE — Telephone Encounter (Signed)
Will follow up this morning to schedule for Telephone visit

## 2019-05-30 ENCOUNTER — Other Ambulatory Visit: Payer: Self-pay

## 2019-05-30 ENCOUNTER — Encounter (HOSPITAL_COMMUNITY): Payer: Self-pay

## 2019-05-30 ENCOUNTER — Emergency Department (HOSPITAL_COMMUNITY): Payer: Medicare Other

## 2019-05-30 ENCOUNTER — Ambulatory Visit (INDEPENDENT_AMBULATORY_CARE_PROVIDER_SITE_OTHER): Payer: Medicare Other | Admitting: Family

## 2019-05-30 ENCOUNTER — Emergency Department (HOSPITAL_COMMUNITY)
Admission: EM | Admit: 2019-05-30 | Discharge: 2019-05-30 | Disposition: A | Discharge status: Home / Self Care | Payer: Medicare Other | Attending: Emergency Medicine | Admitting: Emergency Medicine

## 2019-05-30 VITALS — Temp 103.2°F

## 2019-05-30 DIAGNOSIS — R059 Cough, unspecified: Secondary | ICD-10-CM

## 2019-05-30 DIAGNOSIS — Z20822 Contact with and (suspected) exposure to covid-19: Secondary | ICD-10-CM

## 2019-05-30 DIAGNOSIS — Z20828 Contact with and (suspected) exposure to other viral communicable diseases: Secondary | ICD-10-CM | POA: Insufficient documentation

## 2019-05-30 DIAGNOSIS — R05 Cough: Secondary | ICD-10-CM | POA: Diagnosis present

## 2019-05-30 DIAGNOSIS — Z79899 Other long term (current) drug therapy: Secondary | ICD-10-CM | POA: Insufficient documentation

## 2019-05-30 DIAGNOSIS — J189 Pneumonia, unspecified organism: Secondary | ICD-10-CM | POA: Diagnosis not present

## 2019-05-30 DIAGNOSIS — R509 Fever, unspecified: Secondary | ICD-10-CM | POA: Diagnosis not present

## 2019-05-30 LAB — CBC WITH DIFFERENTIAL/PLATELET
Abs Immature Granulocytes: 0.05 10*3/uL (ref 0.00–0.07)
Basophils Absolute: 0 10*3/uL (ref 0.0–0.1)
Basophils Relative: 0 %
Eosinophils Absolute: 0 10*3/uL (ref 0.0–0.5)
Eosinophils Relative: 0 %
HCT: 41.8 % (ref 39.0–52.0)
Hemoglobin: 14.1 g/dL (ref 13.0–17.0)
Immature Granulocytes: 1 %
Lymphocytes Relative: 4 %
Lymphs Abs: 0.4 10*3/uL — ABNORMAL LOW (ref 0.7–4.0)
MCH: 29.9 pg (ref 26.0–34.0)
MCHC: 33.7 g/dL (ref 30.0–36.0)
MCV: 88.7 fL (ref 80.0–100.0)
Monocytes Absolute: 0.5 10*3/uL (ref 0.1–1.0)
Monocytes Relative: 5 %
Neutro Abs: 9.4 10*3/uL — ABNORMAL HIGH (ref 1.7–7.7)
Neutrophils Relative %: 90 %
Platelets: 161 10*3/uL (ref 150–400)
RBC: 4.71 MIL/uL (ref 4.22–5.81)
RDW: 12.8 % (ref 11.5–15.5)
WBC: 10.3 10*3/uL (ref 4.0–10.5)
nRBC: 0 % (ref 0.0–0.2)

## 2019-05-30 LAB — COMPREHENSIVE METABOLIC PANEL
ALT: 24 U/L (ref 0–44)
AST: 24 U/L (ref 15–41)
Albumin: 3.7 g/dL (ref 3.5–5.0)
Alkaline Phosphatase: 69 U/L (ref 38–126)
Anion gap: 11 (ref 5–15)
BUN: 25 mg/dL — ABNORMAL HIGH (ref 8–23)
CO2: 24 mmol/L (ref 22–32)
Calcium: 9.4 mg/dL (ref 8.9–10.3)
Chloride: 100 mmol/L (ref 98–111)
Creatinine, Ser: 1.42 mg/dL — ABNORMAL HIGH (ref 0.61–1.24)
GFR calc Af Amer: 56 mL/min — ABNORMAL LOW (ref >60)
GFR calc non Af Amer: 48 mL/min — ABNORMAL LOW (ref >60)
Glucose, Bld: 144 mg/dL — ABNORMAL HIGH (ref 70–99)
Potassium: 3.6 mmol/L (ref 3.5–5.1)
Sodium: 135 mmol/L (ref 135–145)
Total Bilirubin: 0.6 mg/dL (ref 0.3–1.2)
Total Protein: 7.1 g/dL (ref 6.5–8.1)

## 2019-05-30 LAB — RESPIRATORY PANEL BY RT PCR (FLU A&B, COVID)
Influenza A by PCR: NEGATIVE
Influenza B by PCR: NEGATIVE
SARS Coronavirus 2 by RT PCR: NEGATIVE

## 2019-05-30 LAB — FERRITIN: Ferritin: 238 ng/mL (ref 24–336)

## 2019-05-30 LAB — TRIGLYCERIDES: Triglycerides: 56 mg/dL (ref <150)

## 2019-05-30 LAB — C-REACTIVE PROTEIN: CRP: 17.3 mg/dL — ABNORMAL HIGH (ref <1.0)

## 2019-05-30 LAB — TROPONIN I (HIGH SENSITIVITY)
Troponin I (High Sensitivity): 22 ng/L — ABNORMAL HIGH (ref <18)
Troponin I (High Sensitivity): 26 ng/L — ABNORMAL HIGH (ref <18)

## 2019-05-30 LAB — D-DIMER, QUANTITATIVE: D-Dimer, Quant: 1.02 ug/mL-FEU — ABNORMAL HIGH (ref 0.00–0.50)

## 2019-05-30 LAB — POC SARS CORONAVIRUS 2 AG -  ED: SARS Coronavirus 2 Ag: NEGATIVE

## 2019-05-30 LAB — LACTIC ACID, PLASMA
Lactic Acid, Venous: 1.4 mmol/L (ref 0.5–1.9)
Lactic Acid, Venous: 0.9 mmol/L (ref 0.5–1.9)

## 2019-05-30 LAB — FIBRINOGEN: Fibrinogen: 760 mg/dL — ABNORMAL HIGH (ref 210–475)

## 2019-05-30 LAB — LACTATE DEHYDROGENASE: LDH: 176 U/L (ref 98–192)

## 2019-05-30 LAB — PROCALCITONIN: Procalcitonin: 1.1 ng/mL

## 2019-05-30 MED ORDER — DOXYCYCLINE HYCLATE 100 MG PO CAPS: 100.0000 mg | ORAL_CAPSULE | Freq: Two times a day (BID) | ORAL | 0 refills | Status: DC

## 2019-05-30 MED ORDER — SODIUM CHLORIDE 0.9 % IV SOLN
1.0000 g | Freq: Once | INTRAVENOUS | Status: AC
  Administered 2019-05-30: 1 g via INTRAVENOUS
  Filled 2019-05-30: qty 10

## 2019-05-30 MED ORDER — IOHEXOL 350 MG/ML SOLN
75.0000 mL | Freq: Once | INTRAVENOUS | Status: AC | PRN
  Administered 2019-05-30: 75 mL via INTRAVENOUS

## 2019-05-30 MED ORDER — AZITHROMYCIN 250 MG PO TABS
500.0000 mg | ORAL_TABLET | Freq: Once | ORAL | Status: AC
  Administered 2019-05-30: 500 mg via ORAL
  Filled 2019-05-30: qty 2

## 2019-05-30 MED ORDER — ACETAMINOPHEN 500 MG PO TABS
1000.0000 mg | ORAL_TABLET | Freq: Once | ORAL | Status: AC
  Administered 2019-05-30: 19:00:00 1000 mg via ORAL
  Filled 2019-05-30: qty 2

## 2019-05-30 MED ORDER — SODIUM CHLORIDE 0.9 % IV SOLN
1000.0000 mL | INTRAVENOUS | Status: DC
  Administered 2019-05-30: 19:00:00 1000 mL via INTRAVENOUS

## 2019-05-30 MED ORDER — IBUPROFEN 400 MG PO TABS
600.0000 mg | ORAL_TABLET | Freq: Once | ORAL | Status: AC
  Administered 2019-05-30: 600 mg via ORAL
  Filled 2019-05-30: qty 1

## 2019-05-30 NOTE — ED Triage Notes (Signed)
Pt arrives POV for 4 days of ongoing fever, chills, cough and malaise. [facility] not [facility]. Pt reports fevers becoming less responsive to tylenol. Pt appears stable in triage. NARD

## 2019-05-30 NOTE — Discharge Instructions (Signed)
Alternate tylenol and ibuprofen for fever.

## 2019-05-30 NOTE — ED Notes (Signed)
Discharge instructions discussed with pt. Pt verbalized understanding. Pt stable and ambulatory. No signature pad available. 

## 2019-05-30 NOTE — Progress Notes (Signed)
This service is provided via telemedicine  No vital signs collected/recorded due to the encounter was a telemedicine visit.   Location of patient (ex: home, work):  home  Patient consents to a telephone visit:  Yes  Location of the provider (ex: office, home):  Office  Name of any referring provider:  Hollace Kinnier, D.O.   Names of all persons participating in the telemedicine service and their role in the encounter:  Joylynn Defrancesco NP, Ruthell Rummage CMA, and Patient   Time spent on call:  Ruthell Rummage CMA spent minutes on phone with patient    Location:      Place of Service:    Provider: Sidnie Swalley FNP-C  Gayland Curry, DO  Patient Care Team: Gayland Curry, DO as PCP - General (Geriatric Medicine) Gaynelle Arabian, MD as Consulting Physician (Orthopedic Surgery) Jarome Matin, MD as Consulting Physician (Dermatology) Raynelle Bring, MD as Consulting Physician (Urology)  Extended Emergency Contact Information Primary Emergency Contact: Clearence Ped of [place] Phone: [phone] Relation: Son  Code Status: Full Code  Goals of care: Advanced Directive information Advanced Directives 05/03/2019  Does Patient Have a Medical Advance Directive? No  Would patient like information on creating a medical advance directive? -  Pre-existing out of facility DNR order (yellow form or pink MOST form) -     Chief Complaint  Patient presents with  . Acute Visit    Patient c/o of fever, shaky and unsteady, loss of taste and smell , reports got the test this morning    . Medication Management    patient states been taking tylenol 1000 mg     HPI:  Pt is a 73 y.o. male seen today  for an acute visit for evaluation of fever.He states temperature this morning was 103.2 he took Tylenol temp went down to 99 but now has gone back to 103.2.He has a chronic cough but has not worsen.He had a telephone visit 05/30/2019 with similar symptoms I send him to get  tested for COVID-19.He states test was done this morning awaiting results in 2-3 days.He is more concerned that the symptoms seems to be worsening.He is having  fever, shaky and unsteady and loss of taste.He states after his testing he went and got a barbecue lunch and could not taste.He denies any N/V/D, shortness of breath or chest tightness.   Past Medical History:  Diagnosis Date  . Basal cell carcinoma (BCC)   . BPH (benign prostatic hyperplasia)   . Cellulitis   . Eczema   . Hyperlipidemia   . Kidney stone   . Plantar fasciitis   . Prostate cancer ([place])   . Right shoulder pain   . Sinusitis   . Spinal stenosis   . Squamous cell carcinoma in situ    Past Surgical History:  Procedure Laterality Date  . CHOLECYSTECTOMY N/A 05/24/2013   Procedure: LAPAROSCOPIC CHOLECYSTECTOMY;  Surgeon: Edward Jolly, MD;  Location: [facility];  Service: General;  Laterality: N/A;  . COLONOSCOPY  1996   polp removal  . KNEE SURGERY    . LITHOTRIPSY    . MRI    . sinus cauterization  1970  . TONSILLECTOMY      No Known Allergies  Outpatient Encounter Medications as of 05/30/2019  Medication Sig  . acetaminophen (TYLENOL) 500 MG tablet Take 500 mg by mouth as needed.  . Ascorbic Acid (VITAMIN C) 1000 MG tablet Take 1,000 mg by mouth daily.   Marland Kitchen BIOTIN PO  Take 1,000 mcg by mouth daily.   . Cholecalciferol (VITAMIN D-3 PO) Take 1,000 mg by mouth daily.   . Glucos-Chondroit-Collag-Hyal (GLUCOSAMINE CHONDROIT-COLLAGEN PO) Take 1 capsule by mouth 2 (two) times daily.   Marland Kitchen MELATONIN PO Take 8 mg by mouth at bedtime.  . Multiple Vitamin (MULTIVITAMIN) tablet Take 1 tablet by mouth daily.  Marland Kitchen neomycin-bacitracin-polymyxin (NEOSPORIN) ointment Apply 1 application topically as needed for wound care.  . tamsulosin (FLOMAX) 0.4 MG CAPS capsule Take 0.4 mg by mouth at bedtime. Prescribed by urologist  . triamcinolone ointment (KENALOG) 0.1 % Apply 1 application topically as needed. Uses for dermatitis  type issues on his hands  . Turmeric Curcumin 500 MG CAPS Take by mouth daily.  Marland Kitchen zinc gluconate 50 MG tablet Take 1 tablet (50 mg total) by mouth daily for 14 days.   No facility-administered encounter medications on file as of 05/30/2019.    Review of Systems  Constitutional: Positive for chills and fever. Negative for appetite change.  HENT: Negative for congestion, rhinorrhea, sinus pressure, sinus pain, sneezing and trouble swallowing.   Eyes: Negative for discharge, redness and itching.  Respiratory: Negative for chest tightness and wheezing.        Chronic cough has not worsen   Cardiovascular: Negative for chest pain, palpitations and leg swelling.  Gastrointestinal: Negative for abdominal distention, abdominal pain, constipation, diarrhea, nausea and vomiting.  Genitourinary: Negative for decreased urine volume, difficulty urinating, dysuria, flank pain and frequency.  Musculoskeletal: Negative for arthralgias, back pain and myalgias.  Skin: Negative for color change, pallor and rash.  Neurological: Negative for dizziness, weakness, light-headedness and headaches.  Hematological: Does not bruise/bleed easily.  Psychiatric/Behavioral: Negative for agitation, confusion and sleep disturbance. The patient is not nervous/anxious.     Immunization History  Administered Date(s) Administered  . Influenza, High Dose Seasonal PF 02/15/2019  . Influenza-Unspecified 04/16/2013, 06/24/2014, 06/19/2015, 06/28/2016, 04/14/2017, 03/14/2018, 05/19/2018  . Pneumococcal Conjugate-13 07/23/2014  . Pneumococcal Polysaccharide-23 12/18/2009  . Pneumococcal-Unspecified 06/14/2016  . Tdap 03/01/2011  . Zoster 03/01/2011   Pertinent  Health Maintenance Due  Topic Date Due  . URINE MICROALBUMIN  06/15/2019 (Originally 02/20/1956)  . COLONOSCOPY  04/14/2022  . INFLUENZA VACCINE  Completed  . PNA vac Low Risk Adult  Completed   Fall Risk  05/03/2019 11/09/2018 10/30/2018 10/19/2018 06/19/2018  Falls in  the past year? 0 0 0 0 0  Number falls in past yr: - 0 0 0 0  Injury with Fall? - 0 0 0 0    Vitals:   05/30/19 1321  Temp: (!) 103.2 F (39.6 C)  TempSrc: Oral   There is no height or weight on file to calculate BMI. Physical Exam Unable to complete on telephone visit.   Labs reviewed: Recent Labs    10/30/18 0859  NA 140  K 4.4  CL 103  CO2 31  GLUCOSE 109*  BUN 27*  CREATININE 0.88  CALCIUM 9.7   Recent Labs    10/30/18 0859  AST 21  ALT 25  BILITOT 0.6  PROT 6.8   Recent Labs    10/30/18 0859  WBC 5.6  NEUTROABS 4,060  HGB 14.5  HCT 43.1  MCV 87.6  PLT 208    Lab Results  Component Value Date   HGBA1C 6.0 (H) 10/30/2018   Lab Results  Component Value Date   CHOL 170 10/30/2018   HDL 38 (L) 10/30/2018   LDLCALC 108 (H) 10/30/2018   TRIG 129 10/30/2018   CHOLHDL  4.5 10/30/2018    Significant Diagnostic Results in last 30 days:  No results found.  Assessment/Plan 1. Fever, unspecified fever cause Temp 103.2 took tylenol without any relief.Had COVID-19 testing this morning.Has new onset of lack of taste.symptoms seems to be worsening.given his advance age I've discuss with patient to go to ED for further evaluation rule out other acute etiologies.  2. Cough Febrile.chronic cough has not worsen.No shortness of breath or wheezing reported.  Family/ staff Communication: Reviewed plan of care with patient to go to the ED for further evaluation of worsening symptoms possible COVID-19.   Labs/tests ordered: None   Spent 13 minutes of non-face to face with patient    Sandrea Hughs, NP

## 2019-05-30 NOTE — ED Provider Notes (Signed)
[facility] EMERGENCY DEPARTMENT Provider Note   CSN: [id number] Arrival date & time: 05/30/19  1723     History Chief Complaint  Patient presents with  . Cough  . Fever    Joseph Marsh is a 73 y.o. male.  Pt presents to the ED today with fever and loss of taste.  Pt said he's had f/c for the past 4 days.  He lost his taste today.  He has been taking tylenol BID for his fever, but the fever continues.  The pt last took tylenol several hours ago.  Pt did get a test today at [facility], but result is not back yet.  Pt has no known covid exposures, but did say he got an oil change from a place where they were not wearing masks.          Past Medical History:  Diagnosis Date  . Basal cell carcinoma (BCC)   . BPH (benign prostatic hyperplasia)   . Cellulitis   . Eczema   . Hyperlipidemia   . Kidney stone   . Plantar fasciitis   . Prostate cancer ([place])   . Right shoulder pain   . Sinusitis   . Spinal stenosis   . Squamous cell carcinoma in situ     Patient Active Problem List   Diagnosis Date Noted  . Pancreatic cyst 05/03/2019  . Renal lesion 05/03/2019  . Dermatitis 06/19/2018  . Hyperglycemia 06/19/2018  . Pure hypercholesterolemia 06/19/2018  . Sinus congestion 06/19/2018  . Right arm pain 06/19/2018  . Plantar fasciitis of right foot 06/19/2018  . Spinal stenosis of lumbar region without neurogenic claudication 06/19/2018  . Cholelithiasis with acute cholecystitis 05/24/2013  . Acute calculous cholecystitis 05/24/2013  . Prostate CA ([place]) 04/05/2012    Past Surgical History:  Procedure Laterality Date  . CHOLECYSTECTOMY N/A 05/24/2013   Procedure: LAPAROSCOPIC CHOLECYSTECTOMY;  Surgeon: Edward Jolly, MD;  Location: [facility];  Service: General;  Laterality: N/A;  . COLONOSCOPY  1996   polp removal  . KNEE SURGERY    . LITHOTRIPSY    . MRI    . sinus cauterization  1970  . TONSILLECTOMY         Family History    Problem Relation Age of Onset  . Melanoma Brother   . Heart disease Father        died from. Cerebral Hemorrage, smoker and ETOH   . Other Mother        Organ Failure, no specified per [facility] new patient packet   . Osteoporosis Sister   . Diverticulitis Sister   . Thyroid disease Sister   . Breast cancer Sister   . Diverticulitis Sister   . Depression Son   . Alcoholism Son   . Pneumonia Nephew     Social History   Tobacco Use  . Smoking status: Never Smoker  . Smokeless tobacco: Never Used  Substance Use Topics  . Alcohol use: No  . Drug use: No    Home Medications Prior to Admission medications   Medication Sig Start Date End Date Taking? Authorizing Provider  acetaminophen (TYLENOL) 500 MG tablet Take 500 mg by mouth as needed.    [provider]  Ascorbic Acid (VITAMIN C) 1000 MG tablet Take 1,000 mg by mouth daily.     [provider]  BIOTIN PO Take 1,000 mcg by mouth daily.     [provider]  Cholecalciferol (VITAMIN D-3 PO) Take 1,000 mg by  mouth daily.     [provider]  doxycycline (VIBRAMYCIN) 100 MG capsule Take 1 capsule (100 mg total) by mouth 2 (two) times daily. 05/30/19   Isla Pence, MD  Glucos-Chondroit-Collag-Hyal (GLUCOSAMINE CHONDROIT-COLLAGEN PO) Take 1 capsule by mouth 2 (two) times daily.     [provider]  MELATONIN PO Take 8 mg by mouth at bedtime.    [provider]  Multiple Vitamin (MULTIVITAMIN) tablet Take 1 tablet by mouth daily.    [provider]  neomycin-bacitracin-polymyxin (NEOSPORIN) ointment Apply 1 application topically as needed for wound care.    [provider]  tamsulosin (FLOMAX) 0.4 MG CAPS capsule Take 0.4 mg by mouth at bedtime. Prescribed by urologist    [provider]  triamcinolone ointment (KENALOG) 0.1 % Apply 1 application topically as needed. Uses for dermatitis type issues on his hands    [provider]  Turmeric  Curcumin 500 MG CAPS Take by mouth daily.    [provider]  zinc gluconate 50 MG tablet Take 1 tablet (50 mg total) by mouth daily for 14 days. 05/29/19 06/12/19  Ngetich, Nelda Bucks, NP    Allergies    Patient has no known allergies.  Review of Systems   Review of Systems  Constitutional: Positive for chills, fatigue and fever.  Respiratory: Positive for cough.   All other systems reviewed and are negative.   Physical Exam Updated Vital Signs BP (!) 114/58   Pulse 77   Temp (!) 101.4 F (38.6 C)   Resp 19   Ht 5\' 10"  (1.778 m)   Wt 79.4 kg   SpO2 93%   BMI 25.11 kg/m   Physical Exam Vitals and nursing note reviewed.  Constitutional:      Appearance: Normal appearance.  HENT:     Head: Normocephalic and atraumatic.     Right Ear: External ear normal.     Left Ear: External ear normal.     Nose: Nose normal.     Mouth/Throat:     Mouth: Mucous membranes are moist.     Pharynx: Oropharynx is clear.  Eyes:     Extraocular Movements: Extraocular movements intact.     Conjunctiva/sclera: Conjunctivae normal.     Pupils: Pupils are equal, round, and reactive to light.  Cardiovascular:     Rate and Rhythm: Regular rhythm. Tachycardia present.     Pulses: Normal pulses.     Heart sounds: Normal heart sounds.  Pulmonary:     Effort: Pulmonary effort is normal.     Breath sounds: Normal breath sounds.  Abdominal:     General: Abdomen is flat. Bowel sounds are normal.     Palpations: Abdomen is soft.  Musculoskeletal:        General: Normal range of motion.     Cervical back: Normal range of motion and neck supple.  Skin:    General: Skin is warm.     Capillary Refill: Capillary refill takes less than 2 seconds.  Neurological:     General: No focal deficit present.     Mental Status: He is alert and oriented to person, place, and time.  Psychiatric:        Mood and Affect: Mood normal.        Behavior: Behavior normal.        Thought Content: Thought  content normal.        Judgment: Judgment normal.     ED Results / Procedures / Treatments  Labs (all labs ordered are listed, but only abnormal results are displayed) Labs Reviewed  CBC WITH DIFFERENTIAL/PLATELET - Abnormal; Notable for the following components:      Result Value   Neutro Abs 9.4 (*)    Lymphs Abs 0.4 (*)    All other components within normal limits  COMPREHENSIVE METABOLIC PANEL - Abnormal; Notable for the following components:   Glucose, Bld 144 (*)    BUN 25 (*)    Creatinine, Ser 1.42 (*)    GFR calc non Af Amer 48 (*)    GFR calc Af Amer 56 (*)    All other components within normal limits  D-DIMER, QUANTITATIVE (NOT AT [facility]) - Abnormal; Notable for the following components:   D-Dimer, Quant 1.02 (*)    All other components within normal limits  FIBRINOGEN - Abnormal; Notable for the following components:   Fibrinogen 760 (*)    All other components within normal limits  C-REACTIVE PROTEIN - Abnormal; Notable for the following components:   CRP 17.3 (*)    All other components within normal limits  TROPONIN I (HIGH SENSITIVITY) - Abnormal; Notable for the following components:   Troponin I (High Sensitivity) 22 (*)    All other components within normal limits  TROPONIN I (HIGH SENSITIVITY) - Abnormal; Notable for the following components:   Troponin I (High Sensitivity) 26 (*)    All other components within normal limits  RESPIRATORY PANEL BY RT PCR (FLU A&B, COVID)  CULTURE, BLOOD (ROUTINE X 2)  CULTURE, BLOOD (ROUTINE X 2)  LACTIC ACID, PLASMA  LACTIC ACID, PLASMA  PROCALCITONIN  LACTATE DEHYDROGENASE  FERRITIN  TRIGLYCERIDES  POC SARS CORONAVIRUS 2 AG -  ED    EKG EKG Interpretation  Date/Time:  Wednesday May 30 2019 17:49:22 EST Ventricular Rate:  105 PR Interval:  142 QRS Duration: 88 QT Interval:  414 QTC Calculation: 547 R Axis:   72 Text Interpretation: Sinus tachycardia with frequent Premature ventricular complexes T wave  abnormality, consider inferior ischemia Prolonged QT Abnormal ECG Since last tracing rate faster Confirmed by Isla Pence [phone]) on 05/30/2019 6:15:38 PM   Radiology DG Chest 2 View  Result Date: 05/30/2019 CLINICAL DATA:  Shortness of breath EXAM: CHEST - 2 VIEW COMPARISON:  06/11/2014 FINDINGS: Mild hyperinflation. Cardiomegaly. Left basilar opacity could reflect atelectasis or infiltrate. Right lung clear. No effusions or acute bony abnormality. IMPRESSION: Mild hyperinflation. Left base atelectasis or infiltrate/pneumonia. Electronically Signed   By: Rolm Baptise M.D.   On: 05/30/2019 18:24   CT Angio Chest PE W and/or Wo Contrast  Result Date: 05/30/2019 CLINICAL DATA:  PE suspected. Generalized weakness and cough with chills and fever. EXAM: CT ANGIOGRAPHY CHEST WITH CONTRAST TECHNIQUE: Multidetector CT imaging of the chest was performed using the standard protocol during bolus administration of intravenous contrast. Multiplanar CT image reconstructions and MIPs were obtained to evaluate the vascular anatomy. CONTRAST:  49mL OMNIPAQUE IOHEXOL 350 MG/ML SOLN COMPARISON:  None. FINDINGS: Cardiovascular: Contrast injection is sufficient to demonstrate satisfactory opacification of the pulmonary arteries to the segmental level. There is no pulmonary embolus. The main pulmonary artery is within normal limits for size. There is no CT evidence of acute right heart strain. There are atherosclerotic changes of the visualized thoracic aorta. Heart size is normal. Coronary artery calcifications are noted. Mediastinum/Nodes: --there is left hilar adenopathy. For example there is a left hilar lymph node measuring approximately 1.3 cm in the short axis. There is a subcarinal lymph node measuring approximately  1 cm in the short axis. --No axillary lymphadenopathy. --No supraclavicular lymphadenopathy. --Normal thyroid gland. --The esophagus is unremarkable Lungs/Pleura: There is extensive consolidation in  the left lower lobe. There is no pneumothorax. There is no large pleural effusion. The trachea is unremarkable. Upper Abdomen: There are nonobstructing stones in the upper pole the right kidney. The patient is status post prior cholecystectomy. Musculoskeletal: No chest wall abnormality. No acute or significant osseous findings. Review of the MIP images confirms the above findings. IMPRESSION: 1. Left lower lobe consolidation concerning for pneumonia in the appropriate clinical setting. As an underlying mass cannot be excluded, follow-up to radiologic resolution is recommended. 2. Left hilar and subcarinal adenopathy, likely reactive. 3. No evidence for pulmonary embolus. 4. Coronary artery disease. Aortic Atherosclerosis (ICD10-I70.0). Electronically Signed   By: Constance Holster M.D.   On: 05/30/2019 22:20    Procedures Procedures (including critical care time)  Medications Ordered in ED Medications  0.9 %  sodium chloride infusion (1,000 mLs Intravenous New Bag/Given 05/30/19 1925)  cefTRIAXone (ROCEPHIN) 1 g in sodium chloride 0.9 % 100 mL IVPB (has no administration in time range)  azithromycin (ZITHROMAX) tablet 500 mg (has no administration in time range)  acetaminophen (TYLENOL) tablet 1,000 mg (1,000 mg Oral Given 05/30/19 1924)  ibuprofen (ADVIL) tablet 600 mg (600 mg Oral Given 05/30/19 2149)  iohexol (OMNIPAQUE) 350 MG/ML injection 75 mL (75 mLs Intravenous Contrast Given 05/30/19 2204)    ED Course  I have reviewed the triage vital signs and the nursing notes.  Pertinent labs & imaging results that were available during my care of the patient were reviewed by me and considered in my medical decision making (see chart for details).    MDM Rules/Calculators/A&P                       Pt's POC covid negative and PCR covid and flu negative.   Pt still with fever and cough, so I ordered a CTA to r/o PE and to eval for pna.  Pt does have a lll pneumonia.  He is given  rocephin/zithromax prior to d/c and is d/c home on doxy.  Pt is oxygenating in the upper 90s.  He is stable for d/c.  Return if worse.  Jhony A Larivee was evaluated in Emergency Department on 05/30/2019 for the symptoms described in the history of present illness. He was evaluated in the context of the global COVID-19 pandemic, which necessitated consideration that the patient might be at risk for infection with the SARS-CoV-2 virus that causes COVID-19. Institutional protocols and algorithms that pertain to the evaluation of patients at risk for COVID-19 are in a state of rapid change based on information released by regulatory bodies including the CDC and federal and state organizations. These policies and algorithms were followed during the patient's care in the ED. Final Clinical Impression(s) / ED Diagnoses Final diagnoses:  Community acquired pneumonia of left lower lobe of lung  COVID-19 virus not detected    Rx / DC Orders ED Discharge Orders         Ordered    doxycycline (VIBRAMYCIN) 100 MG capsule  2 times daily     05/30/19 2252           Isla Pence, MD 05/30/19 2254

## 2019-05-30 NOTE — Patient Instructions (Signed)
Please go to the ED for evaluation of fever and worsening symptoms.

## 2019-05-31 ENCOUNTER — Telehealth: Payer: Self-pay | Admitting: *Deleted

## 2019-05-31 NOTE — Telephone Encounter (Signed)
Pt called to have Rx sent to alternate pharmacy.  EDCM called in Rx to pharmacy of choice [facility]) as requested.

## 2019-06-01 ENCOUNTER — Telehealth: Payer: Self-pay

## 2019-06-01 ENCOUNTER — Ambulatory Visit: Payer: Self-pay | Admitting: Internal Medicine

## 2019-06-01 LAB — NOVEL CORONAVIRUS, NAA: SARS-CoV-2, NAA: NOT DETECTED

## 2019-06-01 NOTE — Telephone Encounter (Signed)
We rescheduled him to next week b/c I wanted to see him after he completed his antibiotics to make sure he was better rather than right away after the ED visit.  Just FYI

## 2019-06-01 NOTE — Telephone Encounter (Signed)
Patient called yesterday at 4:30 pm and left message on machine stating he needed an appointment with his provider. Patient was recently tested for COVID. He discussed being in the ED for 7 hours the night before and was diagnosed with pneumonia and not COVID. Patient states was told to follow up with his provider in 2 days and called to set up an appointment. Patient still complains of fever, cough, and had a hard time breathing especially last night and is concerned.   Scheduled patient for a telephone visit with provider. Patient was asked about mychart and iphone but states he would rather do telephone visit.  Routing to provider to inform.

## 2019-06-04 LAB — CULTURE, BLOOD (ROUTINE X 2): Culture: NO GROWTH

## 2019-06-07 ENCOUNTER — Other Ambulatory Visit: Payer: Self-pay

## 2019-06-07 ENCOUNTER — Encounter: Payer: Self-pay | Admitting: Internal Medicine

## 2019-06-07 ENCOUNTER — Ambulatory Visit (INDEPENDENT_AMBULATORY_CARE_PROVIDER_SITE_OTHER): Payer: Medicare Other | Admitting: Internal Medicine

## 2019-06-07 VITALS — BP 118/72 | HR 77 | Temp 96.9°F | Wt 175.0 lb

## 2019-06-07 DIAGNOSIS — K5909 Other constipation: Secondary | ICD-10-CM | POA: Diagnosis not present

## 2019-06-07 DIAGNOSIS — J320 Chronic maxillary sinusitis: Secondary | ICD-10-CM

## 2019-06-07 DIAGNOSIS — E860 Dehydration: Secondary | ICD-10-CM | POA: Diagnosis not present

## 2019-06-07 DIAGNOSIS — E7800 Pure hypercholesterolemia, unspecified: Secondary | ICD-10-CM

## 2019-06-07 DIAGNOSIS — R739 Hyperglycemia, unspecified: Secondary | ICD-10-CM

## 2019-06-07 DIAGNOSIS — J181 Lobar pneumonia, unspecified organism: Secondary | ICD-10-CM

## 2019-06-07 NOTE — Patient Instructions (Addendum)
I'm glad you are doing better. We will meet again around June and check a full set of labs before that.

## 2019-06-07 NOTE — Progress Notes (Signed)
Location:  [facility] clinic Provider: Maly Lemarr L. Mariea Clonts, D.O., C.M.D.  Goals of Care:  Advanced Directives 06/07/2019  Does Patient Have a Medical Advance Directive? No  Would patient like information on creating a medical advance directive? No - Guardian declined  Pre-existing out of facility DNR order (yellow form or pink MOST form) -     Chief Complaint  Patient presents with  . Medical Management of Chronic Issues     Follow up from Ed visit for pneumonina, everything is better, still having some weakness, couphing a little     HPI: Patient is a 73 y.o. male seen today for an acute visit for f/u from ED visit for left lower lobe pneumonia 12/16.  A week and 1/2 before that he had gone to [employer] to get an oil filter changed and he was not wearing a mask in the drive-thru open on both sides--the guy there was in the car twice.  He tested negative for covid 3 times.  He had pneumonia.  He had a cough, 103 degree temps, could not sleep, appetite was poor.  Last weekend, he was the most rundown he has ever been.    CXR 12/16 had shown mild hyperinflation and left base atelectasis or infiltrate/pneumonia.  WBC 10.3.  He was dehydrated on his labs that day with BUN 25, cr 1.42.  He had a really dry mouth and it was hard to eat, he had no appetite.  He had downright shaking chills and could not even write.  Blood cultures x 2 were negative for growth at 5 days.  He took tylenol to lower his temperature after he detected it.  He finishes his antibiotic tonight (doxycycline).    He is still getting bloody mucus in sinuses from the swabs.  Has perennial sinus irritation, but not like this.    He also had diarrhea and then constipation amid this.  Constipation only cleared up Monday morning.  He had a lot of bloating.     Past Medical History:  Diagnosis Date  . Basal cell carcinoma (BCC)   . BPH (benign prostatic hyperplasia)   . Cellulitis   . Eczema   . Hyperlipidemia   . Kidney stone     . Plantar fasciitis   . Prostate cancer ([place])   . Right shoulder pain   . Sinusitis   . Spinal stenosis   . Squamous cell carcinoma in situ     Past Surgical History:  Procedure Laterality Date  . CHOLECYSTECTOMY N/A 05/24/2013   Procedure: LAPAROSCOPIC CHOLECYSTECTOMY;  Surgeon: Edward Jolly, MD;  Location: [facility];  Service: General;  Laterality: N/A;  . COLONOSCOPY  1996   polp removal  . KNEE SURGERY    . LITHOTRIPSY    . MRI    . sinus cauterization  1970  . TONSILLECTOMY      No Known Allergies  Outpatient Encounter Medications as of 06/07/2019  Medication Sig  . acetaminophen (TYLENOL) 500 MG tablet Take 500 mg by mouth as needed.  . Ascorbic Acid (VITAMIN C) 1000 MG tablet Take 1,000 mg by mouth daily.   Marland Kitchen BIOTIN PO Take 1,000 mcg by mouth daily.   . Cholecalciferol (VITAMIN D-3 PO) Take 1,000 mg by mouth daily.   Marland Kitchen doxycycline (VIBRAMYCIN) 100 MG capsule Take 1 capsule (100 mg total) by mouth 2 (two) times daily.  . Glucos-Chondroit-Collag-Hyal (GLUCOSAMINE CHONDROIT-COLLAGEN PO) Take 1 capsule by mouth 2 (two) times daily.   Marland Kitchen MELATONIN PO Take 8  mg by mouth at bedtime.  . Multiple Vitamin (MULTIVITAMIN) tablet Take 1 tablet by mouth daily.  Marland Kitchen neomycin-bacitracin-polymyxin (NEOSPORIN) ointment Apply 1 application topically as needed for wound care.  . tamsulosin (FLOMAX) 0.4 MG CAPS capsule Take 0.4 mg by mouth at bedtime. Prescribed by urologist  . triamcinolone ointment (KENALOG) 0.1 % Apply 1 application topically as needed. Uses for dermatitis type issues on his hands  . Turmeric Curcumin 500 MG CAPS Take by mouth daily.  Marland Kitchen zinc gluconate 50 MG tablet Take 1 tablet (50 mg total) by mouth daily for 14 days.   No facility-administered encounter medications on file as of 06/07/2019.    Review of Systems:  Review of Systems  Constitutional: Negative for chills, fever and malaise/fatigue.  HENT: Positive for congestion.        Some bloody mucus still  from sinus  Respiratory: Negative for cough and shortness of breath.   Cardiovascular: Negative for chest pain, palpitations and leg swelling.  Gastrointestinal: Negative for abdominal pain, constipation, diarrhea, nausea and vomiting.  Genitourinary: Negative for dysuria.  Musculoskeletal: Negative for falls.  Skin: Negative for itching and rash.  Neurological: Negative for dizziness and loss of consciousness.  Endo/Heme/Allergies: Bruises/bleeds easily.  Psychiatric/Behavioral: Negative for depression.    Health Maintenance  Topic Date Due  . URINE MICROALBUMIN  06/15/2019 (Originally 02/20/1956)  . Hepatitis C Screening  06/15/2019 (Originally 04/07/1946)  . TETANUS/TDAP  02/28/2021  . COLONOSCOPY  04/14/2022  . INFLUENZA VACCINE  Completed  . PNA vac Low Risk Adult  Completed    Physical Exam: Vitals:   06/07/19 0829  BP: 118/72  Pulse: 77  Temp: (!) 96.9 F (36.1 C)  TempSrc: Temporal  SpO2: 94%  Weight: 175 lb (79.4 kg)   Body mass index is 25.11 kg/m. Physical Exam Constitutional:      General: He is not in acute distress.    Appearance: Normal appearance. He is normal weight. He is not ill-appearing or toxic-appearing.     Comments: Well dressed and groomed  HENT:     Head: Normocephalic and atraumatic.  Cardiovascular:     Rate and Rhythm: Normal rate and regular rhythm.     Pulses: Normal pulses.     Heart sounds: Normal heart sounds.  Pulmonary:     Effort: Pulmonary effort is normal.     Breath sounds: Normal breath sounds. No wheezing, rhonchi or rales.     Comments: Left base now clear Musculoskeletal:        General: Normal range of motion.  Skin:    General: Skin is warm and dry.     Coloration: Skin is pale.  Neurological:     General: No focal deficit present.     Mental Status: He is alert and oriented to person, place, and time.     Comments: Was slightly unsteady when he stood up to get onto exam table, but then stabilized     Labs  reviewed: Basic Metabolic Panel: Recent Labs    10/30/18 0859 05/30/19 1846  NA 140 135  K 4.4 3.6  CL 103 100  CO2 31 24  GLUCOSE 109* 144*  BUN 27* 25*  CREATININE 0.88 1.42*  CALCIUM 9.7 9.4   Liver Function Tests: Recent Labs    10/30/18 0859 05/30/19 1846  AST 21 24  ALT 25 24  ALKPHOS  --  69  BILITOT 0.6 0.6  PROT 6.8 7.1  ALBUMIN  --  3.7   No results for input(s):  LIPASE, AMYLASE in the last 8760 hours. No results for input(s): AMMONIA in the last 8760 hours. CBC: Recent Labs    10/30/18 0859 05/30/19 1846  WBC 5.6 10.3  NEUTROABS 4,060 9.4*  HGB 14.5 14.1  HCT 43.1 41.8  MCV 87.6 88.7  PLT 208 161   Lipid Panel: Recent Labs    10/30/18 0859 05/30/19 1846  CHOL 170  --   HDL 38*  --   LDLCALC 108*  --   TRIG 129 56  CHOLHDL 4.5  --    Lab Results  Component Value Date   HGBA1C 6.0 (H) 10/30/2018    Procedures since last visit: DG Chest 2 View  Result Date: 05/30/2019 CLINICAL DATA:  Shortness of breath EXAM: CHEST - 2 VIEW COMPARISON:  06/11/2014 FINDINGS: Mild hyperinflation. Cardiomegaly. Left basilar opacity could reflect atelectasis or infiltrate. Right lung clear. No effusions or acute bony abnormality. IMPRESSION: Mild hyperinflation. Left base atelectasis or infiltrate/pneumonia. Electronically Signed   By: Rolm Baptise M.D.   On: 05/30/2019 18:24   CT Angio Chest PE W and/or Wo Contrast  Result Date: 05/30/2019 CLINICAL DATA:  PE suspected. Generalized weakness and cough with chills and fever. EXAM: CT ANGIOGRAPHY CHEST WITH CONTRAST TECHNIQUE: Multidetector CT imaging of the chest was performed using the standard protocol during bolus administration of intravenous contrast. Multiplanar CT image reconstructions and MIPs were obtained to evaluate the vascular anatomy. CONTRAST:  1mL OMNIPAQUE IOHEXOL 350 MG/ML SOLN COMPARISON:  None. FINDINGS: Cardiovascular: Contrast injection is sufficient to demonstrate satisfactory opacification  of the pulmonary arteries to the segmental level. There is no pulmonary embolus. The main pulmonary artery is within normal limits for size. There is no CT evidence of acute right heart strain. There are atherosclerotic changes of the visualized thoracic aorta. Heart size is normal. Coronary artery calcifications are noted. Mediastinum/Nodes: --there is left hilar adenopathy. For example there is a left hilar lymph node measuring approximately 1.3 cm in the short axis. There is a subcarinal lymph node measuring approximately 1 cm in the short axis. --No axillary lymphadenopathy. --No supraclavicular lymphadenopathy. --Normal thyroid gland. --The esophagus is unremarkable Lungs/Pleura: There is extensive consolidation in the left lower lobe. There is no pneumothorax. There is no large pleural effusion. The trachea is unremarkable. Upper Abdomen: There are nonobstructing stones in the upper pole the right kidney. The patient is status post prior cholecystectomy. Musculoskeletal: No chest wall abnormality. No acute or significant osseous findings. Review of the MIP images confirms the above findings. IMPRESSION: 1. Left lower lobe consolidation concerning for pneumonia in the appropriate clinical setting. As an underlying mass cannot be excluded, follow-up to radiologic resolution is recommended. 2. Left hilar and subcarinal adenopathy, likely reactive. 3. No evidence for pulmonary embolus. 4. Coronary artery disease. Aortic Atherosclerosis (ICD10-I70.0). Electronically Signed   By: Constance Holster M.D.   On: 05/30/2019 22:20    Assessment/Plan 1. Lobar pneumonia ([place]) -finishes doxycycline today -fevers resolved, cough resolved, appetite better and hydrating -covid negative x 3 -flu a and b were also negative -lungs now clear  2. Chronic maxillary sinusitis -continues; now has some blood in mucus that he attributes to his 3 covid tests -suspect some role also of his dehydration he had, dry cold air  last week  3. Other constipation -resolved after intake and hydration improved  4. Dehydration -no phlebotomist in office to recheck labs, but appears euvolemic now - COMPLETE METABOLIC PANEL WITH GFR; Future  5. Pure hypercholesterolemia - f/u labs before we  meet in June - Lipid panel; Future  6. Hyperglycemia - f/u hba1c before next meeting and urine microalbumin--couldn't do today as no phlebotomist and didn't want patient running numerous places amid covid when he's just getting over bacterial pneumonia - CBC with Differential/Platelet; Future - COMPLETE METABOLIC PANEL WITH GFR; Future - Hemoglobin A1c; Future - Lipid panel; Future - Microalbumin/Creatinine Ratio, Urine; Future  Labs/tests ordered:   Lab Orders     CBC with Differential/Platelet     COMPLETE METABOLIC PANEL WITH GFR     Hemoglobin A1c     Lipid panel     Microalbumin/Creatinine Ratio, Urine  Next appt:  6 mos for med mgt with fasting labs before  Spent approximately one hour with patient reviewing sequence of events related to pneumonia to current state  Myrakle Wingler L. Deandrea Vanpelt, D.O. [facility] [address], [place] [address] Cell Phone (Mon-Fri 8am-5pm):  [phone] On Call:  [phone] & follow prompts after 5pm & weekends Office Phone:  [phone] Office Fax:  [phone]

## 2019-06-19 ENCOUNTER — Encounter: Payer: Self-pay | Admitting: Family

## 2019-06-19 ENCOUNTER — Other Ambulatory Visit: Payer: Self-pay

## 2019-06-19 ENCOUNTER — Ambulatory Visit (INDEPENDENT_AMBULATORY_CARE_PROVIDER_SITE_OTHER): Payer: Medicare Other | Admitting: Family

## 2019-06-19 DIAGNOSIS — I700 Atherosclerosis of aorta: Secondary | ICD-10-CM

## 2019-06-19 DIAGNOSIS — I2583 Coronary atherosclerosis due to lipid rich plaque: Secondary | ICD-10-CM

## 2019-06-19 DIAGNOSIS — I2510 Atherosclerotic heart disease of native coronary artery without angina pectoris: Secondary | ICD-10-CM

## 2019-06-19 MED ORDER — ASPIRIN EC 81 MG PO TBEC: 81.0000 mg | DELAYED_RELEASE_TABLET | Freq: Every day | ORAL | 0 refills | Status: DC

## 2019-06-19 NOTE — Patient Instructions (Addendum)
Follow these instructions at home: Medicines  Take over-the-counter and prescription medicines only as told by your health care provider.  Do not take the following medicines unless your health care provider approves: ? NSAIDs, such as ibuprofen, naproxen, or celecoxib. ? Vitamin supplements that contain vitamin A, vitamin E, or both. Lifestyle  Follow an exercise program approved by your health care provider. Aim for 150 minutes of moderate exercise or 75 minutes of vigorous exercise each week.  Maintain a healthy weight or lose weight as approved by your health care provider.  Learn to manage stress or try to limit your stress. Ask your health care provider for suggestions if you need help.  Get screened for depression and seek treatment, if needed.  Do not use any products that contain nicotine or tobacco, such as cigarettes, e-cigarettes, and chewing tobacco. If you need help quitting, ask your health care provider.  Do not use illegal drugs. Eating and drinking   Follow a heart-healthy diet. A dietitian can help educate you about healthy food options and changes. In general, eat plenty of fruits and vegetables, lean meats, and whole grains.  Avoid foods high in: ? Sugar. ? Salt (sodium). ? Saturated fat, such as processed or fatty meat. ? Trans fat, such as fried foods.  Use healthy cooking methods such as roasting, grilling, broiling, baking, poaching, steaming, or stir-frying.  Do not drink alcohol if your health care provider tells you not to drink.  If you drink alcohol: ? Limit how much you have to 0-2 drinks per day. ? Be aware of how much alcohol is in your drink. In the [place] one drink equals one 12 oz bottle of beer (355 mL), one 5 oz glass of wine (148 mL), or one 1 oz glass of hard liquor (44 mL). General instructions  Manage any other health conditions, such as hypertension and diabetes. These conditions affect your heart.  Your health care provider may  ask you to monitor your blood pressure. Ideally, your blood pressure should be below 130/80.  Keep all follow-up visits as told by your health care provider. This is important. Get help right away if:  You have pain in your chest, neck, ear, arm, jaw, stomach, or back that: ? Lasts more than a few minutes. ? Is recurring. ? Is not relieved by taking medicine under your tongue (sublingual nitroglycerin).  You have profuse sweating without cause.  You have unexplained: ? Heartburn or indigestion. ? Shortness of breath or difficulty breathing. ? Fluttering or fast heartbeat (palpitations). ? Nausea or vomiting. ? Fatigue. ? Feelings of nervousness or anxiety. ? Weakness. ? Diarrhea.  You have sudden light-headedness or dizziness.  You faint.  You feel like hurting yourself or think about taking your own life. These symptoms may represent a serious problem that is an emergency. Do not wait to see if the symptoms will go away. Get medical help right away. Call your local emergency services (911 in the [place] Do not drive yourself to the hospital. Summary  Coronary artery disease (CAD) is a condition in which the arteries that lead to the heart (coronary arteries) become narrow or blocked. The narrowing or blockage can lead to a heart attack.  Many people do not have any symptoms during the early stages of CAD.  CAD can be treated with lifestyle changes, medicines, surgery, or a combination of these treatments. This information is not intended to replace advice given to you by your health care provider. Make sure you discuss  any questions you have with your health care provider. Document Revised: 02/17/2018 Document Reviewed: 02/07/2018 Elsevier Patient Education  2020 [employer].

## 2019-06-19 NOTE — Progress Notes (Addendum)
This service is provided via telemedicine  No vital signs collected/recorded due to the encounter was a telemedicine visit.   Location of patient (ex: home, work):  Home   Patient consents to a telephone visit:  yes  Location of the provider (ex: office, home):  Office   Name of any referring provider:  Hollace Kinnier, D.O.   Names of all persons participating in the telemedicine service and their role in the encounter:  Marlowe Sax, NP, Ruthell Rummage CMA and patient   Time spent on call: Ruthell Rummage CMA spent 17 minutes on phone with patient    Provider: Naama Sappington FNP-C  Gayland Curry, DO  Patient Care Team: Gayland Curry, DO as PCP - General (Geriatric Medicine) Gaynelle Arabian, MD as Consulting Physician (Orthopedic Surgery) Jarome Matin, MD as Consulting Physician (Dermatology) Raynelle Bring, MD as Consulting Physician (Urology)  Extended Emergency Contact Information Primary Emergency Contact: Clearence Ped of [place] Phone: [phone] Relation: Son  Code Status:  Full Code  Goals of care: Advanced Directive information Advanced Directives 06/07/2019  Does Patient Have a Medical Advance Directive? No  Would patient like information on creating a medical advance directive? No - Guardian declined  Pre-existing out of facility DNR order (yellow form or pink MOST form) -     Chief Complaint  Patient presents with  . Follow-up    Patient c.o of fatigue and weakness. Would like to discuss information given in recent conversations with [place]. Coronary disease, vaccines, and other things.     HPI:  Pt is a 74 y.o. male seen today for an acute visit for to discuss imaging results given during his hospital admission.He is status post hospital admission 05/30/2019 for left lower lobe pneumonia.He states feeling much better but still has generalized weakness.He states was going through his hospital records and found CT Angiogram of the  chest done 05/30/2019 that indicated that he had the left lower lobe pneumonia but also showed Coronary disease and Aortic Atherosclerosis.He states this was not discussed with him during his hospital admission and does recall having such diagnosis.He would like to know whether he needs to see a specialist for this new diagnosis.I had a length discussion with patient that diagnosis for Coronary disease and Aortic Atherosclerosis are incident findings during imaging.Also discussed with him the high risk factors for for CAD and Aortic Atherosclerosis to including but not limited to his advance age,male Gender,overweight and high cholesterol.He stated does not have any high blood pressure,Type 2 DM ,Family hx of CAD or smoking.Also discussed with him treatment option of maintaining normal blood pressure,using Asprin, heart healthy diet and Exercise.He stated will try to resume his exercise since he has not been able to exercise due to recent pneumonia. He agreed to start on Asprin.He denies any headache,dizziness,palpitation,chest pain or shortness of breath.   Also inquired whether office had received vaccines any COVID- 19 vaccines.He would like to receive the Vaccine as soon as it's available. Patient advised that currently no information has been provided from Hospital management regarding COVID-19 since vaccine only being administered to health Care stuff per [facility] regulation then next phase will be the Elderly patient.He states will call periodically to check on the progress of the vaccines.   Also upset that on his last telephone visit for 05/30/2019,he received part of another patient's After visit summary information and would like to make sure that the other patient did not miss something important.He accused provider for mailing wrong after visit  summary to him.I apologized for the AVS mailed to patient but told him Provider prints the After visit summary but does not mail.will find out whether there  other patient received the AVS. Will also discuss with office manager.    Past Medical History:  Diagnosis Date  . Basal cell carcinoma (BCC)   . BPH (benign prostatic hyperplasia)   . Cellulitis   . Eczema   . Hyperlipidemia   . Kidney stone   . Plantar fasciitis   . Prostate cancer ([place])   . Right shoulder pain   . Sinusitis   . Spinal stenosis   . Squamous cell carcinoma in situ    Past Surgical History:  Procedure Laterality Date  . CHOLECYSTECTOMY N/A 05/24/2013   Procedure: LAPAROSCOPIC CHOLECYSTECTOMY;  Surgeon: Edward Jolly, MD;  Location: [facility];  Service: General;  Laterality: N/A;  . COLONOSCOPY  1996   polp removal  . KNEE SURGERY    . LITHOTRIPSY    . MRI    . sinus cauterization  1970  . TONSILLECTOMY      No Known Allergies  Outpatient Encounter Medications as of 06/19/2019  Medication Sig  . acetaminophen (TYLENOL) 500 MG tablet Take 500 mg by mouth as needed.  . Ascorbic Acid (VITAMIN C) 1000 MG tablet Take 1,000 mg by mouth daily.   Marland Kitchen BIOTIN PO Take 1,000 mcg by mouth daily.   . Cholecalciferol (VITAMIN D-3 PO) Take 1,000 mg by mouth daily.   . Glucos-Chondroit-Collag-Hyal (GLUCOSAMINE CHONDROIT-COLLAGEN PO) Take 1 capsule by mouth 2 (two) times daily.   Marland Kitchen MELATONIN PO Take 8 mg by mouth at bedtime.  . Multiple Vitamin (MULTIVITAMIN) tablet Take 1 tablet by mouth daily.  Marland Kitchen neomycin-bacitracin-polymyxin (NEOSPORIN) ointment Apply 1 application topically as needed for wound care.  . tamsulosin (FLOMAX) 0.4 MG CAPS capsule Take 0.4 mg by mouth at bedtime. Prescribed by urologist  . triamcinolone ointment (KENALOG) 0.1 % Apply 1 application topically as needed. Uses for dermatitis type issues on his hands  . Turmeric Curcumin 500 MG CAPS Take by mouth daily.  . [DISCONTINUED] doxycycline (VIBRAMYCIN) 100 MG capsule Take 1 capsule (100 mg total) by mouth 2 (two) times daily.   No facility-administered encounter medications on file as of 06/19/2019.     Review of Systems  Constitutional: Negative for appetite change and chills.       Generalized weakness has improved  HENT: Negative for congestion, rhinorrhea, sinus pressure, sinus pain, sneezing and sore throat.   Respiratory: Negative for cough, chest tightness, shortness of breath and wheezing.   Cardiovascular: Negative for chest pain, palpitations and leg swelling.  Gastrointestinal: Negative for abdominal distention, abdominal pain, constipation, diarrhea, nausea and vomiting.  Neurological: Negative for dizziness, weakness, light-headedness, numbness and headaches.  Psychiatric/Behavioral: Negative for agitation, confusion and sleep disturbance. The patient is not nervous/anxious.     Immunization History  Administered Date(s) Administered  . Influenza, High Dose Seasonal PF 02/15/2019  . Influenza-Unspecified 04/16/2013, 06/24/2014, 06/19/2015, 06/28/2016, 04/14/2017, 03/14/2018, 05/19/2018  . Pneumococcal Conjugate-13 07/23/2014  . Pneumococcal Polysaccharide-23 12/18/2009  . Pneumococcal-Unspecified 06/14/2016  . Tdap 03/01/2011  . Zoster 03/01/2011   Pertinent  Health Maintenance Due  Topic Date Due  . URINE MICROALBUMIN  02/20/1954  . COLONOSCOPY  04/14/2022  . INFLUENZA VACCINE  Completed  . PNA vac Low Risk Adult  Completed   Fall Risk  06/19/2019 06/07/2019 05/03/2019 11/09/2018 10/30/2018  Falls in the past year? 0 0 0 0 0  Number falls in  past yr: 0 - - 0 0  Injury with Fall? 0 - - 0 0    There were no vitals filed for this visit. There is no height or weight on file to calculate BMI. Physical Exam  Unable to complete on Telephone visit. Labs reviewed: Recent Labs    10/30/18 0859 05/30/19 1846  NA 140 135  K 4.4 3.6  CL 103 100  CO2 31 24  GLUCOSE 109* 144*  BUN 27* 25*  CREATININE 0.88 1.42*  CALCIUM 9.7 9.4   Recent Labs    10/30/18 0859 05/30/19 1846  AST 21 24  ALT 25 24  ALKPHOS  --  69  BILITOT 0.6 0.6  PROT 6.8 7.1  ALBUMIN  --   3.7   Recent Labs    10/30/18 0859 05/30/19 1846  WBC 5.6 10.3  NEUTROABS 4,060 9.4*  HGB 14.5 14.1  HCT 43.1 41.8  MCV 87.6 88.7  PLT 208 161    Lab Results  Component Value Date   HGBA1C 6.0 (H) 10/30/2018   Lab Results  Component Value Date   CHOL 170 10/30/2018   HDL 38 (L) 10/30/2018   LDLCALC 108 (H) 10/30/2018   TRIG 56 05/30/2019   CHOLHDL 4.5 10/30/2018    Significant Diagnostic Results in last 30 days:  DG Chest 2 View  Result Date: 05/30/2019 CLINICAL DATA:  Shortness of breath EXAM: CHEST - 2 VIEW COMPARISON:  06/11/2014 FINDINGS: Mild hyperinflation. Cardiomegaly. Left basilar opacity could reflect atelectasis or infiltrate. Right lung clear. No effusions or acute bony abnormality. IMPRESSION: Mild hyperinflation. Left base atelectasis or infiltrate/pneumonia. Electronically Signed   By: Rolm Baptise M.D.   On: 05/30/2019 18:24   CT Angio Chest PE W and/or Wo Contrast  Result Date: 05/30/2019 CLINICAL DATA:  PE suspected. Generalized weakness and cough with chills and fever. EXAM: CT ANGIOGRAPHY CHEST WITH CONTRAST TECHNIQUE: Multidetector CT imaging of the chest was performed using the standard protocol during bolus administration of intravenous contrast. Multiplanar CT image reconstructions and MIPs were obtained to evaluate the vascular anatomy. CONTRAST:  71mL OMNIPAQUE IOHEXOL 350 MG/ML SOLN COMPARISON:  None. FINDINGS: Cardiovascular: Contrast injection is sufficient to demonstrate satisfactory opacification of the pulmonary arteries to the segmental level. There is no pulmonary embolus. The main pulmonary artery is within normal limits for size. There is no CT evidence of acute right heart strain. There are atherosclerotic changes of the visualized thoracic aorta. Heart size is normal. Coronary artery calcifications are noted. Mediastinum/Nodes: --there is left hilar adenopathy. For example there is a left hilar lymph node measuring approximately 1.3 cm in the  short axis. There is a subcarinal lymph node measuring approximately 1 cm in the short axis. --No axillary lymphadenopathy. --No supraclavicular lymphadenopathy. --Normal thyroid gland. --The esophagus is unremarkable Lungs/Pleura: There is extensive consolidation in the left lower lobe. There is no pneumothorax. There is no large pleural effusion. The trachea is unremarkable. Upper Abdomen: There are nonobstructing stones in the upper pole the right kidney. The patient is status post prior cholecystectomy. Musculoskeletal: No chest wall abnormality. No acute or significant osseous findings. Review of the MIP images confirms the above findings. IMPRESSION: 1. Left lower lobe consolidation concerning for pneumonia in the appropriate clinical setting. As an underlying mass cannot be excluded, follow-up to radiologic resolution is recommended. 2. Left hilar and subcarinal adenopathy, likely reactive. 3. No evidence for pulmonary embolus. 4. Coronary artery disease. Aortic Atherosclerosis (ICD10-I70.0). Electronically Signed   By: Jamie Kato.D.  On: 05/30/2019 22:20    Assessment/Plan   Coronary artery disease due to lipid rich plaque/Aortic atherosclerosis (HCC) Chest pain free.Discussed at length risk factor for CAD and atherosclerosis.patient agreed to start on ASA EC 81 mg tablet on by mouth daily.Advised to monitor for signs of bleeding and notify provider.Dietary and life style modification discussed.patient will resume his exericise regimen post recent diagnosis of lower lobe pneumonia.Latest blood pressure under control.Has follow up Lipid panel labs then will determine need for statin with PCP. Education information provided on AVS.discussed referral to cardiology but will try dietary and lifestyle modification and use of Asprin.   - aspirin EC 81 MG tablet; Take 1 tablet (81 mg total) by mouth daily.  Dispense: 30 tablet; Refill: 0  Family/ staff Communication: Reviewed plan of care with  patient.  Labs/tests ordered: Has active lab orders.    Spent 46 minutes of non-face to face with patient discussing his concerns regarding his hospital results for CT Angiogram of the chest done 05/30/2019 indicated CAD,Aortic atherosclerosis counseling; reviewing medical record; tests; labs; and developing future plan of care.  Next Appointment: As needed.Follow up with Dr.Reed for Routine medical management of chronic issues.   Sandrea Hughs, NP

## 2019-07-15 ENCOUNTER — Ambulatory Visit: Payer: Medicare Other

## 2019-07-20 ENCOUNTER — Ambulatory Visit: Payer: Medicare Other | Attending: Internal Medicine

## 2019-07-20 DIAGNOSIS — Z23 Encounter for immunization: Secondary | ICD-10-CM | POA: Insufficient documentation

## 2019-07-20 NOTE — Progress Notes (Signed)
   Covid-19 Vaccination Clinic  Name:  Joseph Marsh    MRN: [id number] DOB: 1946-03-08  07/20/2019  [name] was observed post Covid-19 immunization for 15 minutes without incidence. He was provided with Vaccine Information Sheet and instruction to access the V-Safe system.   [name] was instructed to call 911 with any severe reactions post vaccine: Marland Kitchen Difficulty breathing  . Swelling of your face and throat  . A fast heartbeat  . A bad rash all over your body  . Dizziness and weakness    Immunizations Administered    Name Date Dose VIS Date Route   Pfizer COVID-19 Vaccine 07/20/2019 11:10 AM 0.3 mL 05/25/2019 Intramuscular   Manufacturer: [place]   Lot: [id number]   [place]: [id number]

## 2019-07-26 ENCOUNTER — Ambulatory Visit: Payer: Medicare Other

## 2019-08-13 DIAGNOSIS — H40013 Open angle with borderline findings, low risk, bilateral: Secondary | ICD-10-CM | POA: Diagnosis not present

## 2019-08-13 DIAGNOSIS — H25013 Cortical age-related cataract, bilateral: Secondary | ICD-10-CM | POA: Diagnosis not present

## 2019-08-13 DIAGNOSIS — H2513 Age-related nuclear cataract, bilateral: Secondary | ICD-10-CM | POA: Diagnosis not present

## 2019-08-13 DIAGNOSIS — H0102A Squamous blepharitis right eye, upper and lower eyelids: Secondary | ICD-10-CM | POA: Diagnosis not present

## 2019-08-15 ENCOUNTER — Ambulatory Visit: Payer: Medicare Other | Attending: Internal Medicine

## 2019-08-15 ENCOUNTER — Ambulatory Visit: Payer: Medicare Other

## 2019-08-15 DIAGNOSIS — Z23 Encounter for immunization: Secondary | ICD-10-CM | POA: Insufficient documentation

## 2019-08-15 NOTE — Progress Notes (Signed)
   Covid-19 Vaccination Clinic  Name:  Joseph Marsh    MRN: [id number] DOB: 07-Oct-1945  08/15/2019  Joseph Marsh was observed post Covid-19 immunization for 15 minutes without incident. He was provided with Vaccine Information Sheet and instruction to access the V-Safe system.   Joseph Marsh was instructed to call 911 with any severe reactions post vaccine: Marland Kitchen Difficulty breathing  . Swelling of face and throat  . A fast heartbeat  . A bad rash all over body  . Dizziness and weakness   Immunizations Administered    Name Date Dose VIS Date Route   Pfizer COVID-19 Vaccine 08/15/2019 10:21 AM 0.3 mL 05/25/2019 Intramuscular   Manufacturer: [place]   Lot: [id number]   [place]: [id number]

## 2019-08-20 DIAGNOSIS — R338 Other retention of urine: Secondary | ICD-10-CM | POA: Diagnosis not present

## 2019-08-24 DIAGNOSIS — R338 Other retention of urine: Secondary | ICD-10-CM | POA: Diagnosis not present

## 2019-08-30 ENCOUNTER — Other Ambulatory Visit: Payer: Self-pay | Admitting: Urology

## 2019-08-30 DIAGNOSIS — D49512 Neoplasm of unspecified behavior of left kidney: Secondary | ICD-10-CM

## 2019-09-28 ENCOUNTER — Ambulatory Visit
Admission: RE | Admit: 2019-09-28 | Discharge: 2019-09-28 | Disposition: A | Discharge status: Home / Self Care | Payer: Medicare Other | Source: Ambulatory Visit | Attending: Urology | Admitting: Urology

## 2019-09-28 ENCOUNTER — Other Ambulatory Visit: Payer: Self-pay

## 2019-09-28 DIAGNOSIS — D49512 Neoplasm of unspecified behavior of left kidney: Secondary | ICD-10-CM

## 2019-09-28 DIAGNOSIS — N2889 Other specified disorders of kidney and ureter: Secondary | ICD-10-CM | POA: Diagnosis not present

## 2019-09-28 MED ORDER — GADOBENATE DIMEGLUMINE 529 MG/ML IV SOLN
16.0000 mL | Freq: Once | INTRAVENOUS | Status: AC | PRN
  Administered 2019-09-28: 16 mL via INTRAVENOUS

## 2019-10-05 DIAGNOSIS — R310 Gross hematuria: Secondary | ICD-10-CM | POA: Diagnosis not present

## 2019-10-05 DIAGNOSIS — R3912 Poor urinary stream: Secondary | ICD-10-CM | POA: Diagnosis not present

## 2019-10-05 DIAGNOSIS — D49512 Neoplasm of unspecified behavior of left kidney: Secondary | ICD-10-CM | POA: Diagnosis not present

## 2019-10-05 DIAGNOSIS — N401 Enlarged prostate with lower urinary tract symptoms: Secondary | ICD-10-CM | POA: Diagnosis not present

## 2019-10-26 DIAGNOSIS — H00035 Abscess of left lower eyelid: Secondary | ICD-10-CM | POA: Diagnosis not present

## 2019-10-26 DIAGNOSIS — H00025 Hordeolum internum left lower eyelid: Secondary | ICD-10-CM | POA: Diagnosis not present

## 2019-11-06 ENCOUNTER — Other Ambulatory Visit: Payer: Self-pay | Admitting: Surgery

## 2019-11-06 DIAGNOSIS — L570 Actinic keratosis: Secondary | ICD-10-CM | POA: Diagnosis not present

## 2019-11-06 DIAGNOSIS — H00025 Hordeolum internum left lower eyelid: Secondary | ICD-10-CM | POA: Diagnosis not present

## 2019-12-25 ENCOUNTER — Telehealth: Payer: Self-pay | Admitting: *Deleted

## 2019-12-25 DIAGNOSIS — R739 Hyperglycemia, unspecified: Secondary | ICD-10-CM

## 2019-12-25 DIAGNOSIS — E7800 Pure hypercholesterolemia, unspecified: Secondary | ICD-10-CM

## 2019-12-25 DIAGNOSIS — Z1152 Encounter for screening for COVID-19: Secondary | ICD-10-CM

## 2019-12-25 DIAGNOSIS — Z1159 Encounter for screening for other viral diseases: Secondary | ICD-10-CM

## 2019-12-25 DIAGNOSIS — C61 Malignant neoplasm of prostate: Secondary | ICD-10-CM

## 2019-12-25 NOTE — Telephone Encounter (Signed)
Patient called and requested to make a physical. (persistent that he wanted a physical NO AWV and NO Follow up).  Scheduled an appointment for 01/14/2020.   Patient is wanting fasting labs before his appointment and wants those placed before he comes. Also requesting for COVID Antibodies to be drawn. Stated that if he has to he will pay out of pocket for this.   Please place labs you want drawn and I will call patient back with a lab appointment.

## 2019-12-25 NOTE — Telephone Encounter (Signed)
Turns out his labs were mostly already ordered.  I add the hep c screen that came up as outstanding.  He can come sometime the week before his 8/2 appt

## 2019-12-27 NOTE — Telephone Encounter (Signed)
He wants the COVID Antibodies ordered to. Can you place an order for these also. He stated he does not care if he has to pay out of pocket.

## 2019-12-27 NOTE — Telephone Encounter (Signed)
LMOM for patient to return call.

## 2019-12-27 NOTE — Addendum Note (Signed)
Addended by: Gayland Curry on: 12/27/2019 10:31 AM   Modules accepted: Orders

## 2019-12-27 NOTE — Telephone Encounter (Signed)
I had no idea what the code was so had to check with the lab.  Order placed.  It may be over $300 if not covered.

## 2019-12-29 ENCOUNTER — Other Ambulatory Visit: Payer: Self-pay

## 2019-12-29 ENCOUNTER — Emergency Department (HOSPITAL_COMMUNITY)
Admission: EM | Admit: 2019-12-29 | Discharge: 2019-12-29 | Disposition: A | Discharge status: Home / Self Care | Payer: Medicare Other | Attending: Emergency Medicine | Admitting: Emergency Medicine

## 2019-12-29 ENCOUNTER — Encounter (HOSPITAL_COMMUNITY): Payer: Self-pay | Admitting: Emergency Medicine

## 2019-12-29 DIAGNOSIS — Z85828 Personal history of other malignant neoplasm of skin: Secondary | ICD-10-CM | POA: Insufficient documentation

## 2019-12-29 DIAGNOSIS — R21 Rash and other nonspecific skin eruption: Secondary | ICD-10-CM | POA: Insufficient documentation

## 2019-12-29 DIAGNOSIS — L299 Pruritus, unspecified: Secondary | ICD-10-CM | POA: Diagnosis present

## 2019-12-29 DIAGNOSIS — Z8546 Personal history of malignant neoplasm of prostate: Secondary | ICD-10-CM | POA: Diagnosis not present

## 2019-12-29 DIAGNOSIS — Z7982 Long term (current) use of aspirin: Secondary | ICD-10-CM | POA: Insufficient documentation

## 2019-12-29 DIAGNOSIS — T63441A Toxic effect of venom of bees, accidental (unintentional), initial encounter: Secondary | ICD-10-CM | POA: Diagnosis not present

## 2019-12-29 MED ORDER — HYDROCORTISONE 2.5 % EX LOTN: TOPICAL_LOTION | Freq: Two times a day (BID) | CUTANEOUS | 0 refills | Status: DC

## 2019-12-29 MED ORDER — PREDNISONE 20 MG PO TABS
60.0000 mg | ORAL_TABLET | Freq: Once | ORAL | Status: DC
  Filled 2019-12-29: qty 3

## 2019-12-29 MED ORDER — PREDNISONE 20 MG PO TABS: ORAL_TABLET | ORAL | 0 refills | Status: DC

## 2019-12-29 NOTE — ED Provider Notes (Signed)
[facility] EMERGENCY DEPARTMENT Provider Note   CSN: [id number] Arrival date & time: 12/29/19  1308     History Chief Complaint  Patient presents with  . Insect Bite    Joseph Marsh is a 74 y.o. male history of hyperlipidemia, eczema, basal cell cancer here presenting with bee stings.  Patient states that he was working out in the yard and then some yellow jackets came out and stung him.  He states that he feels very itchy but denies any trouble speaking or throat closing or shortness of breath. Patient has been in the ED for 2 hours already and states his itchiness is stable.  Did not take any Benadryl prior to arrival.  The history is provided by the patient.       Past Medical History:  Diagnosis Date  . Basal cell carcinoma (BCC)   . BPH (benign prostatic hyperplasia)   . Cellulitis   . Eczema   . Hyperlipidemia   . Kidney stone   . Plantar fasciitis   . Prostate cancer ([place])   . Right shoulder pain   . Sinusitis   . Spinal stenosis   . Squamous cell carcinoma in situ     Patient Active Problem List   Diagnosis Date Noted  . Pancreatic cyst 05/03/2019  . Renal lesion 05/03/2019  . Dermatitis 06/19/2018  . Hyperglycemia 06/19/2018  . Pure hypercholesterolemia 06/19/2018  . Sinus congestion 06/19/2018  . Right arm pain 06/19/2018  . Plantar fasciitis of right foot 06/19/2018  . Spinal stenosis of lumbar region without neurogenic claudication 06/19/2018  . Cholelithiasis with acute cholecystitis 05/24/2013  . Acute calculous cholecystitis 05/24/2013  . Prostate CA ([place]) 04/05/2012    Past Surgical History:  Procedure Laterality Date  . CHOLECYSTECTOMY N/A 05/24/2013   Procedure: LAPAROSCOPIC CHOLECYSTECTOMY;  Surgeon: Edward Jolly, MD;  Location: [facility];  Service: General;  Laterality: N/A;  . COLONOSCOPY  1996   polp removal  . KNEE SURGERY    . LITHOTRIPSY    . MRI    . sinus cauterization  1970  . TONSILLECTOMY          Family History  Problem Relation Age of Onset  . Melanoma Brother   . Heart disease Father        died from. Cerebral Hemorrage, smoker and ETOH   . Other Mother        Organ Failure, no specified per [facility] new patient packet   . Osteoporosis Sister   . Diverticulitis Sister   . Thyroid disease Sister   . Breast cancer Sister   . Diverticulitis Sister   . Depression Son   . Alcoholism Son   . Pneumonia Nephew     Social History   Tobacco Use  . Smoking status: Never Smoker  . Smokeless tobacco: Never Used  Vaping Use  . Vaping Use: Never used  Substance Use Topics  . Alcohol use: No  . Drug use: No    Home Medications Prior to Admission medications   Medication Sig Start Date End Date Taking? Authorizing Provider  acetaminophen (TYLENOL) 500 MG tablet Take 500 mg by mouth as needed.    [provider]  Ascorbic Acid (VITAMIN C) 1000 MG tablet Take 1,000 mg by mouth daily.     [provider]  aspirin EC 81 MG tablet Take 1 tablet (81 mg total) by mouth daily. 06/19/19   Ngetich, Dinah C, NP  BIOTIN PO Take 1,000 mcg by  mouth daily.     [provider]  Cholecalciferol (VITAMIN D-3 PO) Take 1,000 mg by mouth daily.     [provider]  Glucos-Chondroit-Collag-Hyal (GLUCOSAMINE CHONDROIT-COLLAGEN PO) Take 1 capsule by mouth 2 (two) times daily.     [provider]  MELATONIN PO Take 8 mg by mouth at bedtime.    [provider]  Multiple Vitamin (MULTIVITAMIN) tablet Take 1 tablet by mouth daily.    [provider]  neomycin-bacitracin-polymyxin (NEOSPORIN) ointment Apply 1 application topically as needed for wound care.    [provider]  tamsulosin (FLOMAX) 0.4 MG CAPS capsule Take 0.4 mg by mouth at bedtime. Prescribed by urologist    [provider]  triamcinolone ointment (KENALOG) 0.1 % Apply 1 application topically as needed. Uses for dermatitis type issues on his hands    [provider]  Turmeric Curcumin 500 MG CAPS Take by mouth daily.    [provider]    Allergies    Patient has no known allergies.  Review of Systems   Review of Systems  Skin: Positive for rash.  All other systems reviewed and are negative.   Physical Exam Updated Vital Signs BP (!) 142/78 (BP Location: Right Arm)   Pulse 96   Temp 99.1 F (37.3 C) (Oral)   Resp 18   SpO2 99%   Physical Exam Vitals and nursing note reviewed.  HENT:     Head: Normocephalic.     Nose: Nose normal.     Mouth/Throat:     Mouth: Mucous membranes are moist.  Eyes:     Extraocular Movements: Extraocular movements intact.     Pupils: Pupils are equal, round, and reactive to light.  Cardiovascular:     Rate and Rhythm: Normal rate and regular rhythm.     Pulses: Normal pulses.     Heart sounds: Normal heart sounds.  Pulmonary:     Effort: Pulmonary effort is normal.     Breath sounds: Normal breath sounds.  Abdominal:     General: Abdomen is flat.  Musculoskeletal:        General: Normal range of motion.     Cervical back: Normal range of motion.  Skin:    Comments: Multiple bites on the bilateral hands as well as back and left thigh.   Neurological:     General: No focal deficit present.     Mental Status: He is alert and oriented to person, place, and time.  Psychiatric:        Mood and Affect: Mood normal.        Behavior: Behavior normal.     ED Results / Procedures / Treatments   Labs (all labs ordered are listed, but only abnormal results are displayed) Labs Reviewed - No data to display  EKG None  Radiology No results found.  Procedures Procedures (including critical care time)  Medications Ordered in ED Medications  predniSONE (DELTASONE) tablet 60 mg (has no administration in time range)    ED Course  I have reviewed the triage vital signs and the nursing notes.  Pertinent labs & imaging results that were available during my care of the patient  were reviewed by me and considered in my medical decision making (see chart for details).    MDM Rules/Calculators/A&P                          Joseph Marsh is a 74 y.o.  male who presenting with multiple bee stings.  He appears to have some local inflammation.  There is no signs of anaphylaxis.  Patient has been in the ED for 2 hours and appears well. Will dc home with prednisone, benadryl, hydrocortisone cream.   Final Clinical Impression(s) / ED Diagnoses Final diagnoses:  None    Rx / DC Orders ED Discharge Orders    None       Drenda Freeze, MD 12/29/19 1527

## 2019-12-29 NOTE — Discharge Instructions (Signed)
Take prednisone as prescribed.  Use hydrocortisone cream to help with the irritation.  Take Benadryl 25 mg every 6 hours for itchiness.  See your doctor for follow-up  Return to ER if you have trouble breathing, shortness of breath, trouble swallowing, fever.

## 2019-12-29 NOTE — ED Triage Notes (Signed)
Pt. Stated, I have multiple bee stings while I was mowing. No problem with talking, swallowing. Pt concerned with multiple stings.

## 2019-12-31 ENCOUNTER — Telehealth: Payer: Self-pay

## 2019-12-31 DIAGNOSIS — H0102B Squamous blepharitis left eye, upper and lower eyelids: Secondary | ICD-10-CM | POA: Diagnosis not present

## 2019-12-31 DIAGNOSIS — H00025 Hordeolum internum left lower eyelid: Secondary | ICD-10-CM | POA: Diagnosis not present

## 2019-12-31 DIAGNOSIS — H0102A Squamous blepharitis right eye, upper and lower eyelids: Secondary | ICD-10-CM | POA: Diagnosis not present

## 2019-12-31 NOTE — Telephone Encounter (Signed)
Patient called and requested to be scheduled for his fasting labs. I didn't see any labs that required to be fasting and want to make you aware in case something else is needing to be ordered. Routing to Dr. Mariea Clonts.

## 2019-12-31 NOTE — Telephone Encounter (Signed)
Everything was just put in recently.  I think he just expects to fast for his labs.

## 2020-01-01 NOTE — Telephone Encounter (Signed)
Noted  

## 2020-01-02 ENCOUNTER — Telehealth: Payer: Self-pay | Admitting: *Deleted

## 2020-01-02 NOTE — Telephone Encounter (Signed)
It could affect his blood sugar primarily.

## 2020-01-02 NOTE — Telephone Encounter (Signed)
Patient is scheduled to come in tomorrow for labwork for his physical.  Patient stated that he had to go on Prednisone 20mg  this past weekend due to bee stings. Stated that his last day of taking them is Friday.  Patient does not want to reschedule his lab appointment but wants to know if the Prednisone will affect it.  Please Advise.

## 2020-01-03 ENCOUNTER — Other Ambulatory Visit: Payer: Self-pay

## 2020-01-03 ENCOUNTER — Other Ambulatory Visit: Payer: Medicare Other

## 2020-01-03 DIAGNOSIS — E7800 Pure hypercholesterolemia, unspecified: Secondary | ICD-10-CM

## 2020-01-03 DIAGNOSIS — E860 Dehydration: Secondary | ICD-10-CM

## 2020-01-03 DIAGNOSIS — Z1159 Encounter for screening for other viral diseases: Secondary | ICD-10-CM | POA: Diagnosis not present

## 2020-01-03 DIAGNOSIS — R739 Hyperglycemia, unspecified: Secondary | ICD-10-CM | POA: Diagnosis not present

## 2020-01-03 DIAGNOSIS — C61 Malignant neoplasm of prostate: Secondary | ICD-10-CM

## 2020-01-03 DIAGNOSIS — Z1152 Encounter for screening for COVID-19: Secondary | ICD-10-CM

## 2020-01-03 NOTE — Telephone Encounter (Signed)
LMOM with Dr. Cyndi Lennert Advise.

## 2020-01-04 LAB — COMPLETE METABOLIC PANEL WITH GFR
AG Ratio: 1.8 (calc) (ref 1.0–2.5)
ALT: 27 U/L (ref 9–46)
AST: 14 U/L (ref 10–35)
Albumin: 4.3 g/dL (ref 3.6–5.1)
Alkaline phosphatase (APISO): 74 U/L (ref 35–144)
BUN: 19 mg/dL (ref 7–25)
CO2: 27 mmol/L (ref 20–32)
Calcium: 9.1 mg/dL (ref 8.6–10.3)
Chloride: 101 mmol/L (ref 98–110)
Creat: 0.96 mg/dL (ref 0.70–1.18)
GFR, Est African American: 89 mL/min/{1.73_m2} (ref >=60)
GFR, Est Non African American: 77 mL/min/{1.73_m2} (ref >=60)
Globulin: 2.4 g/dL (calc) (ref 1.9–3.7)
Glucose, Bld: 116 mg/dL — ABNORMAL HIGH (ref 65–99)
Potassium: 4 mmol/L (ref 3.5–5.3)
Sodium: 138 mmol/L (ref 135–146)
Total Bilirubin: 0.6 mg/dL (ref 0.2–1.2)
Total Protein: 6.7 g/dL (ref 6.1–8.1)

## 2020-01-04 LAB — CBC WITH DIFFERENTIAL/PLATELET
Absolute Monocytes: 468 cells/uL (ref 200–950)
Basophils Absolute: 18 cells/uL (ref 0–200)
Basophils Relative: 0.3 %
Eosinophils Absolute: 18 cells/uL (ref 15–500)
Eosinophils Relative: 0.3 %
HCT: 48 % (ref 38.5–50.0)
Hemoglobin: 15.7 g/dL (ref 13.2–17.1)
Lymphs Abs: 1260 cells/uL (ref 850–3900)
MCH: 28.9 pg (ref 27.0–33.0)
MCHC: 32.7 g/dL (ref 32.0–36.0)
MCV: 88.2 fL (ref 80.0–100.0)
MPV: 9.5 fL (ref 7.5–12.5)
Monocytes Relative: 7.8 %
Neutro Abs: 4236 cells/uL (ref 1500–7800)
Neutrophils Relative %: 70.6 %
Platelets: 205 10*3/uL (ref 140–400)
RBC: 5.44 10*6/uL (ref 4.20–5.80)
RDW: 13.4 % (ref 11.0–15.0)
Total Lymphocyte: 21 %
WBC: 6 10*3/uL (ref 3.8–10.8)

## 2020-01-04 LAB — HEMOGLOBIN A1C
Hgb A1c MFr Bld: 6 % of total Hgb — ABNORMAL HIGH (ref <5.7)
Mean Plasma Glucose: 126 (calc)
eAG (mmol/L): 7 (calc)

## 2020-01-04 LAB — HEPATITIS C ANTIBODY
Hepatitis C Ab: NONREACTIVE
SIGNAL TO CUT-OFF: 0.01 (ref <1.00)

## 2020-01-04 LAB — SARS COV-2 SEROLOGY(COVID-19)AB(IGG,IGM),IMMUNOASSAY
SARS CoV-2 AB IgG: NEGATIVE
SARS CoV-2 IgM: NEGATIVE

## 2020-01-04 LAB — LIPID PANEL
Cholesterol: 165 mg/dL (ref <200)
HDL: 47 mg/dL (ref >=40)
LDL Cholesterol (Calc): 90 mg/dL (calc)
Non-HDL Cholesterol (Calc): 118 mg/dL (calc) (ref <130)
Total CHOL/HDL Ratio: 3.5 (calc) (ref <5.0)
Triglycerides: 189 mg/dL — ABNORMAL HIGH (ref <150)

## 2020-01-04 LAB — PSA, TOTAL AND FREE
PSA, % Free: UNDETERMINED % (calc) (ref >25)
PSA, Free: <0.1 ng/mL
PSA, Total: <0.1 ng/mL (ref <=4.0)

## 2020-01-04 LAB — MICROALBUMIN / CREATININE URINE RATIO
Creatinine, Urine: 103 mg/dL (ref 20–320)
Microalb Creat Ratio: 12 mcg/mg creat (ref <30)
Microalb, Ur: 1.2 mg/dL

## 2020-01-04 NOTE — Progress Notes (Signed)
Blood counts are normal. Bad cholesterol (LDL) is at goal of less than 100--it's 90. Triglycerides are high at 189--these come from starchy fats/carbs and sweets. There was no significant protein in his urine. Electrolytes, liver and kidneys were all ok. Fasting sugar was elevated at 116.  Hba1c (3 month average of blood sugar) was in the prediabetic range at 6 which is unchanged from a year ago Hepatitis C screening test was negative. PSA was below detectable levels (prostate). Covid antibody screen was negative for recent infection---the lab results indicate on them that the length of time these antibodies last is uncertain so it does not necessarily prove that he did not have covid before.

## 2020-01-04 NOTE — Progress Notes (Signed)
PSA testing was done b/c he has asked about why I don't do it at each visit.   This appears to be the correct test to me and it is the test that [facility] indicated was the correct one for covid antibodies when our phlebotomist called to confirm.

## 2020-01-14 ENCOUNTER — Other Ambulatory Visit: Payer: Self-pay

## 2020-01-14 ENCOUNTER — Encounter: Payer: Self-pay | Admitting: Internal Medicine

## 2020-01-14 ENCOUNTER — Ambulatory Visit (INDEPENDENT_AMBULATORY_CARE_PROVIDER_SITE_OTHER): Payer: Medicare Other | Admitting: Internal Medicine

## 2020-01-14 VITALS — BP 122/82 | HR 73 | Ht 70.0 in | Wt 177.6 lb

## 2020-01-14 DIAGNOSIS — N289 Disorder of kidney and ureter, unspecified: Secondary | ICD-10-CM

## 2020-01-14 DIAGNOSIS — I2510 Atherosclerotic heart disease of native coronary artery without angina pectoris: Secondary | ICD-10-CM

## 2020-01-14 DIAGNOSIS — Z0000 Encounter for general adult medical examination without abnormal findings: Secondary | ICD-10-CM

## 2020-01-14 DIAGNOSIS — Z86010 Personal history of colonic polyps: Secondary | ICD-10-CM

## 2020-01-14 DIAGNOSIS — I700 Atherosclerosis of aorta: Secondary | ICD-10-CM | POA: Diagnosis not present

## 2020-01-14 DIAGNOSIS — E781 Pure hyperglyceridemia: Secondary | ICD-10-CM

## 2020-01-14 DIAGNOSIS — I2583 Coronary atherosclerosis due to lipid rich plaque: Secondary | ICD-10-CM | POA: Diagnosis not present

## 2020-01-14 DIAGNOSIS — Z9889 Other specified postprocedural states: Secondary | ICD-10-CM

## 2020-01-14 DIAGNOSIS — Z8546 Personal history of malignant neoplasm of prostate: Secondary | ICD-10-CM

## 2020-01-14 DIAGNOSIS — K862 Cyst of pancreas: Secondary | ICD-10-CM | POA: Diagnosis not present

## 2020-01-14 DIAGNOSIS — R739 Hyperglycemia, unspecified: Secondary | ICD-10-CM | POA: Diagnosis not present

## 2020-01-14 NOTE — Patient Instructions (Signed)
Call GI for your colonoscopy.

## 2020-01-14 NOTE — Progress Notes (Addendum)
Provider:  Rexene Edison. Mariea Clonts, D.O., C.M.D. Location:   [facility]   Place of Service:   clinic  Previous PCP: Gayland Curry, DO Patient Care Team: Gayland Curry, DO as PCP - General (Geriatric Medicine) Gaynelle Arabian, MD as Consulting Physician (Orthopedic Surgery) Jarome Matin, MD as Consulting Physician (Dermatology) Raynelle Bring, MD as Consulting Physician (Urology)  Extended Emergency Contact Information Primary Emergency Contact: Clearence Ped of [place] Phone: [phone] Relation: Son  Code Status: full code Goals of Care: Advanced Directive information Advanced Directives 01/14/2020  Does Patient Have a Medical Advance Directive? No  Would patient like information on creating a medical advance directive? -  Pre-existing out of facility DNR order (yellow form or pink MOST form) -  Need copies of living will and health care power of attorney--appears he needs to complete them   Chief Complaint  Patient presents with  . Annual Exam    Annual Physical Exam     HPI: Patient is a 74 y.o. male seen today for an extended visit and med mgt of chronic diseases.  He is seeing urology (Dr. Alinda Money) in a few weeks and we've sent them the results.  He had external and seeds for prostate cancer in 2012.    He's had significant prostate problems, but other than that, he's been mostly ok.   Last September, he had a complete urinary retention issue.  That's when tamsulosin was started.  He had a CT to begin with and then a kidney lesion was found.  MRI was done with a small lesion present--was very small and might or might not be cancerous, but slow growing.  This is being monitored.  He had a f/u MRI earlier this year.    Around March, he was having urinary problems with bleeding that became ongoing.  He did not have full retention, but challenges emptying and frequency.  Had a urology visit.  Catheter was put in for 5 days.  He was already on tamsulosin daily then.   He felt like he was ok for a while after taking it, but it would wear off.  The tamsulosin was doubled and finasteride added. After catheter was removed that time, there was a little bleeding off and on but urinating normally and things back to normal since.  No further bleeding issues.  No difficulty urinating.  Has developed a little bit of dribbling if he does not go when he has an urge.  Still gets up 3 times per night which was going on even prior to his diagnosis in 2011.     Wants flu shot when available.  Has had a stye/sore on his left eyelid--Dr. Kathlen Mody at [facility] did minor surgery a couple of months ago and he followed up a couple weeks ago and it was much better.  Still not 100%.  Antibiotics were extended so he will be on them 7 days per month--doxycycline.  Had been on a weaker abx before the surgery.  His annual and f/u is in a few more weeks.    Has a spot on his forehead--has annual with derm soon.  A couple of years ago, he had a basal cell and squamous cell removed.    Does have some ice cream and chocolate.  TG were mildly elevated.  Does drink a lot of fruit juice, as well--OJ, grape and apple juice.  Fasting sugar was also elevated.  He'd been on steroids for multiple bee stings at the time of  his labs.  Stings had happened when mowing his yard and hit a next apparently.    He had only soreness of his arm in feb with the first covid vaccine (2/5).    Reviewed old imaging studies:  Dec '20 CTA had shown coronary artery calcification and aortic atherosclerosis.    Past Medical History:  Diagnosis Date  . Basal cell carcinoma (BCC)   . BPH (benign prostatic hyperplasia)   . Cellulitis   . Eczema   . Hyperlipidemia   . Kidney stone   . Plantar fasciitis   . Prostate cancer ([place])   . Right shoulder pain   . Sinusitis   . Spinal stenosis   . Squamous cell carcinoma in situ    Past Surgical History:  Procedure Laterality Date  . CHOLECYSTECTOMY N/A 05/24/2013    Procedure: LAPAROSCOPIC CHOLECYSTECTOMY;  Surgeon: Edward Jolly, MD;  Location: [facility];  Service: General;  Laterality: N/A;  . COLONOSCOPY  1996   polp removal  . KNEE SURGERY    . LITHOTRIPSY    . MRI    . sinus cauterization  1970  . TONSILLECTOMY      reports that he has never smoked. He has never used smokeless tobacco. He reports that he does not drink alcohol and does not use drugs.  Functional Status Survey:    Family History  Problem Relation Age of Onset  . Melanoma Brother   . Heart disease Father        died from. Cerebral Hemorrage, smoker and ETOH   . Other Mother        Organ Failure, no specified per [facility] new patient packet   . Osteoporosis Sister   . Diverticulitis Sister   . Thyroid disease Sister   . Breast cancer Sister   . Diverticulitis Sister   . Depression Son   . Alcoholism Son   . Pneumonia Nephew     Health Maintenance  Topic Date Due  . INFLUENZA VACCINE  01/13/2020  . TETANUS/TDAP  02/28/2021  . COLONOSCOPY  04/14/2022  . COVID-19 Vaccine  Completed  . Hepatitis C Screening  Completed  . PNA vac Low Risk Adult  Completed    No Known Allergies  Outpatient Encounter Medications as of 01/14/2020  Medication Sig  . acetaminophen (TYLENOL) 500 MG tablet Take 500 mg by mouth as needed.  . Ascorbic Acid (VITAMIN C) 1000 MG tablet Take 1,000 mg by mouth daily.   Marland Kitchen BIOTIN PO Take 1,000 mcg by mouth daily.   . Cholecalciferol (VITAMIN D-3 PO) Take 1,000 mg by mouth daily.   Marland Kitchen doxycycline (VIBRAMYCIN) 100 MG capsule Take 100 mg by mouth 2 (two) times daily.  . finasteride (PROSCAR) 2.5 mg tablet Take 5 mg by mouth daily.  . Glucos-Chondroit-Collag-Hyal (GLUCOSAMINE CHONDROIT-COLLAGEN PO) Take 1 capsule by mouth 2 (two) times daily.   . hydrocortisone 2.5 % lotion Apply topically 2 (two) times daily.  Marland Kitchen MELATONIN PO Take 8 mg by mouth at bedtime.  . Multiple Vitamin (MULTIVITAMIN) tablet Take 1 tablet by mouth daily.  Marland Kitchen  neomycin-bacitracin-polymyxin (NEOSPORIN) ointment Apply 1 application topically as needed for wound care.  . tamsulosin (FLOMAX) 0.4 MG CAPS capsule Take 0.4 mg by mouth in the morning and at bedtime. Prescribed by urologist   . triamcinolone ointment (KENALOG) 0.1 % Apply 1 application topically as needed. Uses for dermatitis type issues on his hands  . Turmeric Curcumin 500 MG CAPS Take by mouth daily.  . [DISCONTINUED] aspirin  EC 81 MG tablet Take 1 tablet (81 mg total) by mouth daily.  . [DISCONTINUED] predniSONE (DELTASONE) 20 MG tablet Take 60 mg daily x 2 days then 40 mg daily x 2 days then 20 mg daily x 2 days   No facility-administered encounter medications on file as of 01/14/2020.    Review of Systems  Constitutional: Negative for chills, fever and malaise/fatigue.  HENT: Negative for congestion, hearing loss and sore throat.   Eyes: Negative for blurred vision.       Glasses, stye nearly healed left lower lid  Respiratory: Negative for cough and shortness of breath.   Cardiovascular: Negative for chest pain, palpitations and leg swelling.  Gastrointestinal: Negative for abdominal pain, blood in stool, constipation, diarrhea and melena.  Genitourinary: Positive for urgency. Negative for dysuria.       3x nocturia unchanged, no recent hematuria or retention since march  Musculoskeletal: Negative for back pain, falls and joint pain.  Skin: Negative for itching and rash.  Neurological: Positive for tingling and sensory change. Negative for dizziness and loss of consciousness.       In feet at night  Psychiatric/Behavioral: Negative for depression and memory loss. The patient is not nervous/anxious and does not have insomnia.     Vitals:   01/14/20 0934  BP: 122/82  Pulse: 73  SpO2: 96%  Weight: 177 lb 9.6 oz (80.6 kg)  Height: 5\' 10"  (1.778 m)   Body mass index is 25.48 kg/m. Physical Exam Vitals reviewed.  Constitutional:      General: He is not in acute distress.     Appearance: Normal appearance. He is normal weight. He is not ill-appearing or toxic-appearing.  HENT:     Head: Normocephalic and atraumatic.     Right Ear: Tympanic membrane, ear canal and external ear normal.     Left Ear: Tympanic membrane, ear canal and external ear normal.     Nose: Nose normal.     Mouth/Throat:     Pharynx: Oropharynx is clear.  Eyes:     Extraocular Movements: Extraocular movements intact.     Conjunctiva/sclera: Conjunctivae normal.     Pupils: Pupils are equal, round, and reactive to light.     Comments: glasses  Neck:     Comments: Left cervical lymph node soft and mobile Cardiovascular:     Rate and Rhythm: Normal rate and regular rhythm.     Pulses: Normal pulses.     Heart sounds: Normal heart sounds.  Pulmonary:     Effort: Pulmonary effort is normal.     Breath sounds: Normal breath sounds. No wheezing, rhonchi or rales.  Abdominal:     General: Bowel sounds are normal.     Palpations: Abdomen is soft.     Tenderness: There is no abdominal tenderness. There is no guarding or rebound.  Musculoskeletal:        General: Normal range of motion.     Cervical back: Neck supple.     Right lower leg: No edema.     Left lower leg: No edema.  Skin:    General: Skin is warm.  Neurological:     General: No focal deficit present.     Mental Status: He is alert and oriented to person, place, and time.     Cranial Nerves: No cranial nerve deficit.     Sensory: No sensory deficit.     Motor: No weakness.     Coordination: Coordination normal.     Gait:  Gait normal.     Comments: 1+ patellar DTRs, sensation intact to monofilament bilateral feet  Psychiatric:        Mood and Affect: Mood normal.        Behavior: Behavior normal.        Thought Content: Thought content normal.        Judgment: Judgment normal.     Labs reviewed: Basic Metabolic Panel: Recent Labs    05/30/19 1846 01/03/20 0835  NA 135 138  K 3.6 4.0  CL 100 101  CO2 24 27    GLUCOSE 144* 116*  BUN 25* 19  CREATININE 1.42* 0.96  CALCIUM 9.4 9.1   Liver Function Tests: Recent Labs    05/30/19 1846 01/03/20 0835  AST 24 14  ALT 24 27  ALKPHOS 69  --   BILITOT 0.6 0.6  PROT 7.1 6.7  ALBUMIN 3.7  --    No results for input(s): LIPASE, AMYLASE in the last 8760 hours. No results for input(s): AMMONIA in the last 8760 hours. CBC: Recent Labs    05/30/19 1846 01/03/20 0835  WBC 10.3 6.0  NEUTROABS 9.4* 4,236  HGB 14.1 15.7  HCT 41.8 48.0  MCV 88.7 88.2  PLT 161 205   Cardiac Enzymes: No results for input(s): CKTOTAL, CKMB, CKMBINDEX, TROPONINI in the last 8760 hours. BNP: Invalid input(s): POCBNP Lab Results  Component Value Date   HGBA1C 6.0 (H) 01/03/2020    Lab Results  Component Value Date   VITAMINB12 419 04/12/2012   Lab Results  Component Value Date   FOLATE >24.8 04/12/2012   Lab Results  Component Value Date   IRON 88 04/12/2012   FERRITIN 238 05/30/2019    Imaging and Procedures Recently: Reviewed MRIs of abdomen 4/19 and CT chest and EKG from 12/20  Assessment/Plan 1. Annual physical exam -performed today, reviewed labs with him and got updates since last visit  2. Coronary artery disease due to lipid rich plaque -was noted on CT chest done when he had pneumonia -asymptomatic  3. Aortic atherosclerosis ([place]) -noted on CT chest  -will monitor  4. Pure hypertriglyceridemia -LDL was at goal but TG high likely due to recent prednisone and possibly his chocolate, popsicles and two large bowls of ice cream/week  5. Hyperglycemia -due prednisone most likely given timing after bee stings, but will recheck next time hba1c  6. H/O prostate cancer -s/p external and seed radiation w/o recurrence, follows with Dr. Alinda Money  7. Renal lesion -MRI abd being rechecked by Dr. Alinda Money  8. Pancreatic cyst -MRI abd being rechecked by Dr. Alinda Money  9. H/O colonoscopy with polypectomy -got letter from [facility] so advised he  should call them to arrange his probably last colonoscopy because he would like to get one more done  Labs/tests ordered:  Cbc, cmp, flp, hba1c before  75 minutes spent with patient today  [place] L. Brisa Auth, D.O. [facility] [address], [place] [address] Cell Phone (Mon-Fri 8am-5pm):  [phone] On Call:  [phone] & follow prompts after 5pm & weekends Office Phone:  [phone] Office Fax:  [phone]

## 2020-02-01 ENCOUNTER — Other Ambulatory Visit: Payer: Self-pay | Admitting: Urology

## 2020-02-01 DIAGNOSIS — D49512 Neoplasm of unspecified behavior of left kidney: Secondary | ICD-10-CM

## 2020-02-04 ENCOUNTER — Other Ambulatory Visit: Payer: Self-pay

## 2020-02-04 ENCOUNTER — Ambulatory Visit
Admission: RE | Admit: 2020-02-04 | Discharge: 2020-02-04 | Disposition: A | Discharge status: Home / Self Care | Payer: Medicare Other | Source: Ambulatory Visit | Attending: Urology | Admitting: Urology

## 2020-02-04 DIAGNOSIS — D49512 Neoplasm of unspecified behavior of left kidney: Secondary | ICD-10-CM

## 2020-02-04 DIAGNOSIS — K862 Cyst of pancreas: Secondary | ICD-10-CM | POA: Diagnosis not present

## 2020-02-04 DIAGNOSIS — N281 Cyst of kidney, acquired: Secondary | ICD-10-CM | POA: Diagnosis not present

## 2020-02-04 DIAGNOSIS — N2889 Other specified disorders of kidney and ureter: Secondary | ICD-10-CM | POA: Diagnosis not present

## 2020-02-04 DIAGNOSIS — Z9049 Acquired absence of other specified parts of digestive tract: Secondary | ICD-10-CM | POA: Diagnosis not present

## 2020-02-04 MED ORDER — GADOBENATE DIMEGLUMINE 529 MG/ML IV SOLN
16.0000 mL | Freq: Once | INTRAVENOUS | Status: AC | PRN
  Administered 2020-02-04: 16 mL via INTRAVENOUS

## 2020-02-11 DIAGNOSIS — D1801 Hemangioma of skin and subcutaneous tissue: Secondary | ICD-10-CM | POA: Diagnosis not present

## 2020-02-11 DIAGNOSIS — Z8546 Personal history of malignant neoplasm of prostate: Secondary | ICD-10-CM | POA: Diagnosis not present

## 2020-02-11 DIAGNOSIS — N401 Enlarged prostate with lower urinary tract symptoms: Secondary | ICD-10-CM | POA: Diagnosis not present

## 2020-02-11 DIAGNOSIS — Z85828 Personal history of other malignant neoplasm of skin: Secondary | ICD-10-CM | POA: Diagnosis not present

## 2020-02-11 DIAGNOSIS — L821 Other seborrheic keratosis: Secondary | ICD-10-CM | POA: Diagnosis not present

## 2020-02-11 DIAGNOSIS — L570 Actinic keratosis: Secondary | ICD-10-CM | POA: Diagnosis not present

## 2020-02-11 DIAGNOSIS — R3912 Poor urinary stream: Secondary | ICD-10-CM | POA: Diagnosis not present

## 2020-02-11 DIAGNOSIS — D49512 Neoplasm of unspecified behavior of left kidney: Secondary | ICD-10-CM | POA: Diagnosis not present

## 2020-02-11 DIAGNOSIS — L812 Freckles: Secondary | ICD-10-CM | POA: Diagnosis not present

## 2020-02-14 ENCOUNTER — Encounter: Payer: Self-pay | Admitting: Internal Medicine

## 2020-02-14 DIAGNOSIS — H00025 Hordeolum internum left lower eyelid: Secondary | ICD-10-CM | POA: Diagnosis not present

## 2020-02-14 DIAGNOSIS — H2513 Age-related nuclear cataract, bilateral: Secondary | ICD-10-CM | POA: Diagnosis not present

## 2020-02-14 DIAGNOSIS — H40013 Open angle with borderline findings, low risk, bilateral: Secondary | ICD-10-CM | POA: Diagnosis not present

## 2020-02-14 DIAGNOSIS — H25013 Cortical age-related cataract, bilateral: Secondary | ICD-10-CM | POA: Diagnosis not present

## 2020-02-15 ENCOUNTER — Telehealth: Payer: Self-pay | Admitting: Internal Medicine

## 2020-02-15 NOTE — Telephone Encounter (Signed)
He will be eligible for the covid booster when other seniors are which I've heard is after 9/20.

## 2020-02-15 NOTE — Telephone Encounter (Signed)
wants a call to discuss antibody test & COVID booster  If appt is needed to meet with Dr Mariea Clonts, he is on board with that as well.  Thanks,  Vilinda Blanks

## 2020-02-19 NOTE — Telephone Encounter (Signed)
Patient notified and agreed.  

## 2020-02-25 ENCOUNTER — Ambulatory Visit (INDEPENDENT_AMBULATORY_CARE_PROVIDER_SITE_OTHER): Payer: Medicare Other | Admitting: Nurse Practitioner

## 2020-02-25 ENCOUNTER — Telehealth: Payer: Self-pay

## 2020-02-25 ENCOUNTER — Other Ambulatory Visit: Payer: Self-pay

## 2020-02-25 DIAGNOSIS — Z0000 Encounter for general adult medical examination without abnormal findings: Secondary | ICD-10-CM

## 2020-02-25 NOTE — Progress Notes (Signed)
   This service is provided via telemedicine  No vital signs collected/recorded due to the encounter was a telemedicine visit.   Location of patient (ex: home, work):  Home  Patient consents to a telephone visit:  Yes, see telephone encounter dated 02/25/2020 with annual consent    Location of the provider (ex: office, home):  [facility], Office   Name of any referring provider:  Gayland Curry, DO  Names of all persons participating in the telemedicine service and their role in the encounter:  S.Chrae B/CMA, Sherrie Mustache, NP, and Patient   Time spent on call:  9 min with medical assistant

## 2020-02-25 NOTE — Progress Notes (Signed)
Subjective:   Joseph Marsh is a 74 y.o. male who presents for Medicare Annual/Subsequent preventive examination.  Review of Systems     Cardiac Risk Factors include: advanced age (>76men, >50 women);male gender;family history of premature cardiovascular disease;dyslipidemia     Objective:    There were no vitals filed for this visit. There is no height or weight on file to calculate BMI.  Advanced Directives 02/25/2020 01/14/2020 12/29/2019 06/07/2019 05/30/2019 05/03/2019 03/06/2019  Does Patient Have a Medical Advance Directive? No No No No No No No  Would patient like information on creating a medical advance directive? No - Patient declined - No - Patient declined No - Guardian declined No - Patient declined - -  Pre-existing out of facility DNR order (yellow form or pink MOST form) - - - - - - -    Current Medications (verified) Outpatient Encounter Medications as of 02/25/2020  Medication Sig  . acetaminophen (TYLENOL) 500 MG tablet Take 500 mg by mouth as needed.  . Ascorbic Acid (VITAMIN C) 1000 MG tablet Take 1,000 mg by mouth daily.   Marland Kitchen BIOTIN PO Take 1,000 mcg by mouth daily.   . Cholecalciferol (VITAMIN D-3 PO) Take 1,000 mg by mouth daily.   Marland Kitchen doxycycline (VIBRAMYCIN) 100 MG capsule Take 100 mg by mouth 2 (two) times daily. Takes 7 days out of the month per eye doctor  . finasteride (PROSCAR) 5 MG tablet Take 5 mg by mouth daily.  . Glucos-Chondroit-Collag-Hyal (GLUCOSAMINE CHONDROIT-COLLAGEN PO) Take 1 capsule by mouth 2 (two) times daily.   . hydrocortisone 2.5 % lotion Apply topically 2 (two) times daily.  Marland Kitchen MELATONIN PO Take 8 mg by mouth at bedtime.  . Multiple Vitamin (MULTIVITAMIN) tablet Take 1 tablet by mouth daily.  Marland Kitchen neomycin-bacitracin-polymyxin (NEOSPORIN) ointment Apply 1 application topically as needed for wound care.  . tamsulosin (FLOMAX) 0.4 MG CAPS capsule Take 0.4 mg by mouth in the morning and at bedtime. Prescribed by urologist   .  triamcinolone ointment (KENALOG) 0.1 % Apply 1 application topically as needed. Uses for dermatitis type issues on his hands  . Turmeric Curcumin 500 MG CAPS Take by mouth daily.  . [DISCONTINUED] finasteride (PROSCAR) 2.5 mg tablet Take 5 mg by mouth daily.   No facility-administered encounter medications on file as of 02/25/2020.    Allergies (verified) Patient has no known allergies.   History: Past Medical History:  Diagnosis Date  . Basal cell carcinoma (BCC)   . BPH (benign prostatic hyperplasia)   . Cellulitis   . Eczema   . Hyperlipidemia   . Kidney stone   . Plantar fasciitis   . Prostate cancer ([place])   . Right shoulder pain   . Sinusitis   . Spinal stenosis   . Squamous cell carcinoma in situ    Past Surgical History:  Procedure Laterality Date  . CHOLECYSTECTOMY N/A 05/24/2013   Procedure: LAPAROSCOPIC CHOLECYSTECTOMY;  Surgeon: Edward Jolly, MD;  Location: [facility];  Service: General;  Laterality: N/A;  . COLONOSCOPY  1996   polp removal  . KNEE SURGERY    . LITHOTRIPSY    . MRI    . sinus cauterization  1970  . TONSILLECTOMY     Family History  Problem Relation Age of Onset  . Melanoma Brother   . Heart disease Father        died from. Cerebral Hemorrage, smoker and ETOH   . Other Mother        Organ  Failure, no specified per [facility] new patient packet   . Osteoporosis Sister   . Diverticulitis Sister   . Thyroid disease Sister   . Breast cancer Sister   . Diverticulitis Sister   . Depression Son   . Alcoholism Son   . Pneumonia Nephew    Social History   Socioeconomic History  . Marital status: Divorced    Spouse name: Not on file  . Number of children: 2  . Years of education: Not on file  . Highest education level: Not on file  Occupational History    Employer: [employer]  Tobacco Use  . Smoking status: Never Smoker  . Smokeless tobacco: Never Used  Vaping Use  . Vaping Use: Never used  Substance and Sexual Activity  . Alcohol  use: No  . Drug use: No  . Sexual activity: Yes  Other Topics Concern  . 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 No   Do you have difficulty preparing food or eating? No   Do you have difficulty managing your medications? No   Do you have difficulty managing your finances? No   Do you have difficulty affording your medications? No   Social Determinants of Health   Financial Resource Strain:   . Difficulty of Paying Living Expenses: Not on file  Food Insecurity:   . Worried About Charity fundraiser in the Last Year: Not on file  . Ran Out of Food in the Last Year: Not on file  Transportation Needs:   . Lack of Transportation (Medical): Not on file  . Lack of Transportation (Non-Medical): Not on file  Physical Activity:   . Days of Exercise per Week: Not on file  . Minutes of Exercise per Session: Not on file  Stress:   . Feeling of Stress : Not on file  Social Connections:   . Frequency of Communication with Friends and Family: Not on file  . Frequency of Social Gatherings with Friends and Family: Not on file  . Attends Religious Services: Not on file  . Active Member of Clubs or Organizations: Not on file  . Attends Archivist Meetings: Not on file  . Marital Status:  Not on file    Tobacco Counseling Counseling given: Not Answered   Clinical Intake:  Pre-visit preparation completed: Yes  Pain : No/denies pain     BMI - recorded: 25 Nutritional Status: BMI 25 -29 Overweight Diabetes: No  How often do you need to have someone help you when you read instructions, pamphlets, or other written materials from  your doctor or pharmacy?: 1 - Never  Diabetic?no          Activities of Daily Living In your present state of health, do you have any difficulty performing the following activities: 02/25/2020  Hearing? N  Vision? N  Difficulty concentrating or making decisions? N  Walking or climbing stairs? N  Dressing or bathing? N  Doing errands, shopping? N  Preparing Food and eating ? N  Using the Toilet? N  In the past six months, have you accidently leaked urine? Y  Do you have problems with loss of bowel control? N  Managing your Medications? N  Managing your Finances? N  Housekeeping or managing your Housekeeping? N  Some recent data might be hidden    Patient Care Team: Gayland Curry, DO as PCP - General (Geriatric Medicine) Gaynelle Arabian, MD as Consulting Physician (Orthopedic Surgery) Jarome Matin, MD as Consulting Physician (Dermatology) Raynelle Bring, MD as Consulting Physician (Urology)  Indicate any recent Medical Services you may have received from other than [facility] providers in the past year (date may be approximate).     Assessment:   This is a routine wellness examination for Azhar.  Hearing/Vision screen  Hearing Screening   125Hz  250Hz  500Hz  1000Hz  2000Hz  3000Hz  4000Hz  6000Hz  8000Hz   Right ear:           Left ear:           Comments: No hearing issues   Vision Screening Comments: Last eye exam up to date, Dr.Weaver. Annual eye exam last week   Dietary issues and exercise activities discussed: Current Exercise Habits: The patient has a physically strenuous job, but has no regular exercise apart from  work.  Goals    . Exercise 3x per week (30 min per time)     More exercise year round    . Patient Stated     To deal with recently increase increase in side effects from prior prostate cancer.       Depression Screen PHQ 2/9 Scores 02/25/2020 01/14/2020 06/07/2019 11/09/2018 10/30/2018 10/19/2018  PHQ - 2 Score 0 0 0 0 0 0    Fall Risk Fall Risk  02/25/2020 01/14/2020 06/19/2019 06/07/2019 05/03/2019  Falls in the past year? 0 0 0 0 0  Number falls in past yr: 0 0 0 - -  Injury with Fall? 0 0 0 - -    Any stairs in or around the home? Yes  If so, are there any without handrails? No  Home free of loose throw rugs in walkways, pet beds, electrical cords, etc? Yes  Adequate lighting in your home to reduce risk of falls? Yes   ASSISTIVE DEVICES UTILIZED TO PREVENT FALLS:  Life alert? No  Use of a cane, walker or w/c? No  Grab bars in the bathroom? No  Shower chair or bench in shower? No  Elevated toilet seat or a handicapped toilet? No   TIMED UP AND GO: Na  Cognitive Function: MMSE - Mini Mental State Exam 10/30/2018  Orientation to time 5  Orientation to Place 5  Registration 3  Attention/ Calculation 5  Recall 0  Language- name 2 objects 2  Language- repeat 1  Language- follow 3 step command 3  Language- read & follow direction 1  Write a sentence 1  Copy design 1  Total score 27     6CIT Screen 02/25/2020  What Year? 0 points  What month? 0 points  What time? 0 points  Count back  from 20 0 points  Months in reverse 0 points  Repeat phrase 0 points  Total Score 0    Immunizations Immunization History  Administered Date(s) Administered  . Influenza, High Dose Seasonal PF 02/15/2019  . Influenza-Unspecified 04/16/2013, 06/24/2014, 06/19/2015, 06/28/2016, 04/14/2017, 03/14/2018, 05/19/2018  . PFIZER SARS-COV-2 Vaccination 07/20/2019, 08/15/2019  . Pneumococcal Conjugate-13 07/23/2014  . Pneumococcal Polysaccharide-23 12/18/2009  . Pneumococcal-Unspecified  06/14/2016  . Tdap 03/01/2011  . Zoster 03/01/2011    TDAP status: Up to date To complete at next visit  Pneumococcal vaccine status: Up to date Covid-19 vaccine status: Completed vaccines  Qualifies for Shingles Vaccine? Yes   Zostavax completed Yes   Shingrix Completed?: No.    Education has been provided regarding the importance of this vaccine. Patient has been advised to call insurance company to determine out of pocket expense if they have not yet received this vaccine. Advised may also receive vaccine at local pharmacy or Health Dept. Verbalized acceptance and understanding.  Screening Tests Health Maintenance  Topic Date Due  . INFLUENZA VACCINE  01/13/2020  . TETANUS/TDAP  02/28/2021  . COVID-19 Vaccine  Completed  . Hepatitis C Screening  Completed  . PNA vac Low Risk Adult  Completed    Health Maintenance  Health Maintenance Due  Topic Date Due  . INFLUENZA VACCINE  01/13/2020    Colorectal cancer screening: No longer required.   Lung Cancer Screening: (Low Dose CT Chest recommended if Age 57-80 years, 30 pack-year currently smoking OR have quit w/in 15years.) does not qualify.   Lung Cancer Screening Referral: na  Additional Screening:  Hepatitis C Screening: does qualify; Completed 01/03/20  Vision Screening: Recommended annual ophthalmology exams for early detection of glaucoma and other disorders of the eye. Is the patient up to date with their annual eye exam?  yes Who is the provider or what is the name of the office in which the patient attends annual eye exams? Dr Kathlen Mody at [place] If pt is not established with a provider, would they like to be referred to a provider to establish care? No .   Dental Screening: Recommended annual dental exams for proper oral hygiene  Community Resource Referral / Chronic Care Management: CRR required this visit?  No   CCM required this visit?  No      Plan:     I have personally reviewed and noted the  following in the patient's chart:   . Medical and social history . Use of alcohol, tobacco or illicit drugs  . Current medications and supplements . Functional ability and status . Nutritional status . Physical activity . Advanced directives . List of other physicians . Hospitalizations, surgeries, and ER visits in previous 12 months . Vitals . Screenings to include cognitive, depression, and falls . Referrals and appointments  In addition, I have reviewed and discussed with patient certain preventive protocols, quality metrics, and best practice recommendations. A written personalized care plan for preventive services as well as general preventive health recommendations were provided to patient.     Lauree Chandler, NP   02/25/2020    Virtual Visit via Telephone Note  I connected with@ on 02/25/20 at  2:45 PM EDT by telephone and verified that I am speaking with the correct person using two identifiers.  Location: Patient: home Provider: office   I discussed the limitations, risks, security and privacy concerns of performing an evaluation and management service by telephone and the availability of in person appointments. I also discussed with  the patient that there may be a patient responsible charge related to this service. The patient expressed understanding and agreed to proceed.   I discussed the assessment and treatment plan with the patient. The patient was provided an opportunity to ask questions and all were answered. The patient agreed with the plan and demonstrated an understanding of the instructions.   The patient was advised to call back or seek an in-person evaluation if the symptoms worsen or if the condition fails to improve as anticipated.  I provided 18 minutes of non-face-to-face time during this encounter.  Carlos American. [name] Avs printed and mailed

## 2020-02-25 NOTE — Telephone Encounter (Signed)
[name], keltner are scheduled for a virtual visit with your provider today.    Just as we do with appointments in the office, we must obtain your consent to participate.  Your consent will be active for this visit and any virtual visit you may have with one of our providers in the next 365 days.    If you have a MyChart account, I can also send a copy of this consent to you electronically.  All virtual visits are billed to your insurance company just like a traditional visit in the office.  As this is a virtual visit, video technology does not allow for your provider to perform a traditional examination.  This may limit your provider's ability to fully assess your condition.  If your provider identifies any concerns that need to be evaluated in person or the need to arrange testing such as labs, EKG, etc, we will make arrangements to do so.    Although advances in technology are sophisticated, we cannot ensure that it will always work on either your end or our end.  If the connection with a video visit is poor, we may have to switch to a telephone visit.  With either a video or telephone visit, we are not always able to ensure that we have a secure connection.   I need to obtain your verbal consent now.   Are you willing to proceed with your visit today?   [name] has provided verbal consent on 02/25/2020 for a virtual visit (video or telephone).   Leigh Aurora [place], [place] 02/25/2020  3:00 pm

## 2020-02-25 NOTE — Patient Instructions (Signed)
Joseph Marsh , Thank you for taking time to come for your Medicare Wellness Visit. I appreciate your ongoing commitment to your health goals. Please review the following plan we discussed and let me know if I can assist you in the future.   Screening recommendations/referrals: Colonoscopy - recommend to call to schedule Recommended yearly ophthalmology/optometry visit for glaucoma screening and checkup Recommended yearly dental visit for hygiene and checkup  Vaccinations: Influenza vaccine- to get FLU Vaccine when you are in office next Pneumococcal vaccine - up to date Tdap vaccine -up to date Shingles vaccine -Shingrix vaccine- bring records of vaccine to next office visit    Advanced directives: recommend to complete.   Conditions/risks identified: advanced age.   Next appointment: 03/06/2020 and 1 year for AWV   Preventive Care 27 Years and Older, Male Preventive care refers to lifestyle choices and visits with your health care provider that can promote health and wellness. What does preventive care include?  A yearly physical exam. This is also called an annual well check.  Dental exams once or twice a year.  Routine eye exams. Ask your health care provider how often you should have your eyes checked.  Personal lifestyle choices, including:  Daily care of your teeth and gums.  Regular physical activity.  Eating a healthy diet.  Avoiding tobacco and drug use.  Limiting alcohol use.  Practicing safe sex.  Taking low doses of aspirin every day.  Taking vitamin and mineral supplements as recommended by your health care provider. What happens during an annual well check? The services and screenings done by your health care provider during your annual well check will depend on your age, overall health, lifestyle risk factors, and family history of disease. Counseling  Your health care provider may ask you questions about your:  Alcohol use.  Tobacco use.  Drug  use.  Emotional well-being.  Home and relationship well-being.  Sexual activity.  Eating habits.  History of falls.  Memory and ability to understand (cognition).  Work and work Statistician. Screening  You may have the following tests or measurements:  Height, weight, and BMI.  Blood pressure.  Lipid and cholesterol levels. These may be checked every 5 years, or more frequently if you are over 18 years old.  Skin check.  Lung cancer screening. You may have this screening every year starting at age 76 if you have a 30-pack-year history of smoking and currently smoke or have quit within the past 15 years.  Fecal occult blood test (FOBT) of the stool. You may have this test every year starting at age 84.  Flexible sigmoidoscopy or colonoscopy. You may have a sigmoidoscopy every 5 years or a colonoscopy every 10 years starting at age 1.  Prostate cancer screening. Recommendations will vary depending on your family history and other risks.  Hepatitis C blood test.  Hepatitis B blood test.  Sexually transmitted disease (STD) testing.  Diabetes screening. This is done by checking your blood sugar (glucose) after you have not eaten for a while (fasting). You may have this done every 1-3 years.  Abdominal aortic aneurysm (AAA) screening. You may need this if you are a current or former smoker.  Osteoporosis. You may be screened starting at age 25 if you are at high risk. Talk with your health care provider about your test results, treatment options, and if necessary, the need for more tests. Vaccines  Your health care provider may recommend certain vaccines, such as:  Influenza vaccine. This is  recommended every year.  Tetanus, diphtheria, and acellular pertussis (Tdap, Td) vaccine. You may need a Td booster every 10 years.  Zoster vaccine. You may need this after age 60.  Pneumococcal 13-valent conjugate (PCV13) vaccine. One dose is recommended after age  15.  Pneumococcal polysaccharide (PPSV23) vaccine. One dose is recommended after age 58. Talk to your health care provider about which screenings and vaccines you need and how often you need them. This information is not intended to replace advice given to you by your health care provider. Make sure you discuss any questions you have with your health care provider. Document Released: 06/27/2015 Document Revised: 02/18/2016 Document Reviewed: 04/01/2015 Elsevier Interactive Patient Education  2017 [place] Prevention in the Home Falls can cause injuries. They can happen to people of all ages. There are many things you can do to make your home safe and to help prevent falls. What can I do on the outside of my home?  Regularly fix the edges of walkways and driveways and fix any cracks.  Remove anything that might make you trip as you walk through a door, such as a raised step or threshold.  Trim any bushes or trees on the path to your home.  Use bright outdoor lighting.  Clear any walking paths of anything that might make someone trip, such as rocks or tools.  Regularly check to see if handrails are loose or broken. Make sure that both sides of any steps have handrails.  Any raised decks and porches should have guardrails on the edges.  Have any leaves, snow, or ice cleared regularly.  Use sand or salt on walking paths during winter.  Clean up any spills in your garage right away. This includes oil or grease spills. What can I do in the bathroom?  Use night lights.  Install grab bars by the toilet and in the tub and shower. Do not use towel bars as grab bars.  Use non-skid mats or decals in the tub or shower.  If you need to sit down in the shower, use a plastic, non-slip stool.  Keep the floor dry. Clean up any water that spills on the floor as soon as it happens.  Remove soap buildup in the tub or shower regularly.  Attach bath mats securely with double-sided  non-slip rug tape.  Do not have throw rugs and other things on the floor that can make you trip. What can I do in the bedroom?  Use night lights.  Make sure that you have a light by your bed that is easy to reach.  Do not use any sheets or blankets that are too big for your bed. They should not hang down onto the floor.  Have a firm chair that has side arms. You can use this for support while you get dressed.  Do not have throw rugs and other things on the floor that can make you trip. What can I do in the kitchen?  Clean up any spills right away.  Avoid walking on wet floors.  Keep items that you use a lot in easy-to-reach places.  If you need to reach something above you, use a strong step stool that has a grab bar.  Keep electrical cords out of the way.  Do not use floor polish or wax that makes floors slippery. If you must use wax, use non-skid floor wax.  Do not have throw rugs and other things on the floor that can make you trip.  What can I do with my stairs?  Do not leave any items on the stairs.  Make sure that there are handrails on both sides of the stairs and use them. Fix handrails that are broken or loose. Make sure that handrails are as long as the stairways.  Check any carpeting to make sure that it is firmly attached to the stairs. Fix any carpet that is loose or worn.  Avoid having throw rugs at the top or bottom of the stairs. If you do have throw rugs, attach them to the floor with carpet tape.  Make sure that you have a light switch at the top of the stairs and the bottom of the stairs. If you do not have them, ask someone to add them for you. What else can I do to help prevent falls?  Wear shoes that:  Do not have high heels.  Have rubber bottoms.  Are comfortable and fit you well.  Are closed at the toe. Do not wear sandals.  If you use a stepladder:  Make sure that it is fully opened. Do not climb a closed stepladder.  Make sure that both  sides of the stepladder are locked into place.  Ask someone to hold it for you, if possible.  Clearly mark and make sure that you can see:  Any grab bars or handrails.  First and last steps.  Where the edge of each step is.  Use tools that help you move around (mobility aids) if they are needed. These include:  Canes.  Walkers.  Scooters.  Crutches.  Turn on the lights when you go into a dark area. Replace any light bulbs as soon as they burn out.  Set up your furniture so you have a clear path. Avoid moving your furniture around.  If any of your floors are uneven, fix them.  If there are any pets around you, be aware of where they are.  Review your medicines with your doctor. Some medicines can make you feel dizzy. This can increase your chance of falling. Ask your doctor what other things that you can do to help prevent falls. This information is not intended to replace advice given to you by your health care provider. Make sure you discuss any questions you have with your health care provider. Document Released: 03/27/2009 Document Revised: 11/06/2015 Document Reviewed: 07/05/2014 Elsevier Interactive Patient Education  2017 [employer].

## 2020-03-06 ENCOUNTER — Ambulatory Visit: Payer: Medicare Other | Admitting: Internal Medicine

## 2020-03-10 ENCOUNTER — Encounter: Payer: Self-pay | Admitting: Internal Medicine

## 2020-03-11 ENCOUNTER — Encounter: Payer: Self-pay | Admitting: Internal Medicine

## 2020-03-12 ENCOUNTER — Other Ambulatory Visit: Payer: Self-pay

## 2020-03-12 ENCOUNTER — Ambulatory Visit (INDEPENDENT_AMBULATORY_CARE_PROVIDER_SITE_OTHER): Payer: Medicare Other | Admitting: *Deleted

## 2020-03-12 DIAGNOSIS — Z23 Encounter for immunization: Secondary | ICD-10-CM

## 2020-03-13 NOTE — Telephone Encounter (Signed)
Message routed to Reed, Tiffany L, DO  

## 2020-03-17 ENCOUNTER — Ambulatory Visit: Payer: Medicare Other | Admitting: Internal Medicine

## 2020-04-08 NOTE — Addendum Note (Signed)
Addended by: Hollace Kinnier L on: 04/08/2020 12:47 PM   Modules accepted: Level of Service

## 2020-04-11 DIAGNOSIS — Z23 Encounter for immunization: Secondary | ICD-10-CM | POA: Diagnosis not present

## 2020-08-04 ENCOUNTER — Encounter: Payer: Self-pay | Admitting: Internal Medicine

## 2020-08-12 HISTORY — PX: COLONOSCOPY: SHX174

## 2020-08-14 DIAGNOSIS — H40013 Open angle with borderline findings, low risk, bilateral: Secondary | ICD-10-CM | POA: Diagnosis not present

## 2020-08-14 DIAGNOSIS — H0102B Squamous blepharitis left eye, upper and lower eyelids: Secondary | ICD-10-CM | POA: Diagnosis not present

## 2020-08-14 DIAGNOSIS — H0102A Squamous blepharitis right eye, upper and lower eyelids: Secondary | ICD-10-CM | POA: Diagnosis not present

## 2020-08-14 DIAGNOSIS — H524 Presbyopia: Secondary | ICD-10-CM | POA: Diagnosis not present

## 2020-08-19 ENCOUNTER — Other Ambulatory Visit: Payer: Self-pay | Admitting: Family Medicine

## 2020-08-19 DIAGNOSIS — R739 Hyperglycemia, unspecified: Secondary | ICD-10-CM

## 2020-08-19 DIAGNOSIS — N289 Disorder of kidney and ureter, unspecified: Secondary | ICD-10-CM

## 2020-08-19 DIAGNOSIS — I2510 Atherosclerotic heart disease of native coronary artery without angina pectoris: Secondary | ICD-10-CM

## 2020-08-19 DIAGNOSIS — C61 Malignant neoplasm of prostate: Secondary | ICD-10-CM

## 2020-08-19 NOTE — Progress Notes (Signed)
Fasting lab work ordered prior to next visit

## 2020-09-01 DIAGNOSIS — H25013 Cortical age-related cataract, bilateral: Secondary | ICD-10-CM | POA: Diagnosis not present

## 2020-09-01 DIAGNOSIS — H40013 Open angle with borderline findings, low risk, bilateral: Secondary | ICD-10-CM | POA: Diagnosis not present

## 2020-09-01 DIAGNOSIS — H04123 Dry eye syndrome of bilateral lacrimal glands: Secondary | ICD-10-CM | POA: Diagnosis not present

## 2020-09-01 DIAGNOSIS — H2513 Age-related nuclear cataract, bilateral: Secondary | ICD-10-CM | POA: Diagnosis not present

## 2020-10-14 IMAGING — MR MR ABDOMEN WO/W CM
16 of 19 series · 38 of 48 positions shown · IV contrast (16 ML MULTIHANCE)
Comparison: 03/26/2019 MRI abdomen.

CLINICAL DATA: Follow-up suspicious left renal mass and cystic
pancreatic tail lesion.

EXAM:
MRI ABDOMEN WITHOUT AND WITH CONTRAST
TECHNIQUE: Multiplanar multisequence MR imaging of the abdomen was performed
both before and after the administration of intravenous contrast.
CONTRAST:  16mL MULTIHANCE GADOBENATE DIMEGLUMINE 529 MG/ML IV SOLN

[Series 3: T2 · coronal · 5.0mm · 1.56mm/px · 1 of 36 slices shown (1 of 4)]
[im 1/36]
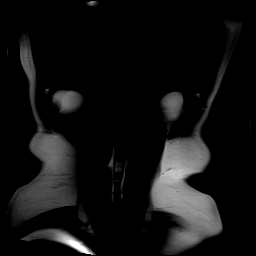

[Series 4: T1 · axial · 3.0mm · 1.19mm/px · z∈[-166,+47]mm · 4 of 144 slices shown]
[im 1/144]
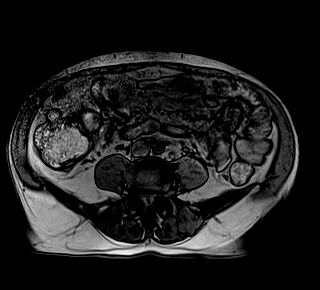
[im 48/144]
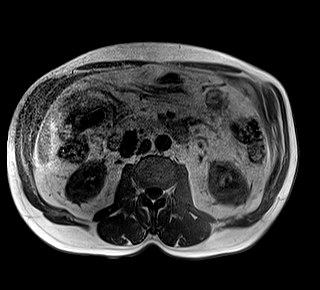
[im 96/144]
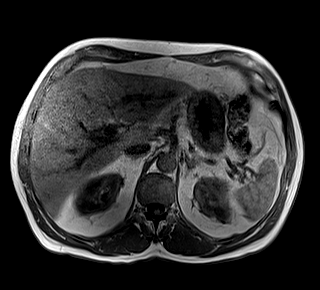
[im 144/144]
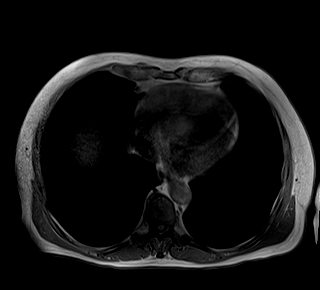

[Series 6: DWI · axial · 5.0mm · 1.42mm/px · z∈[-135,+87]mm · 4 of 114 slices shown (1 of 2)]
[im 1/114]
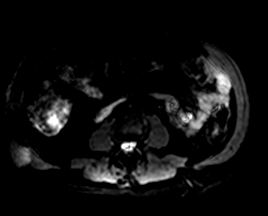
[im 38/114]
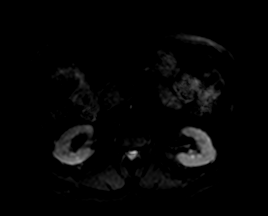
[im 76/114]
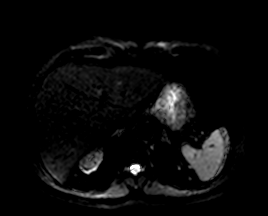
[im 114/114]
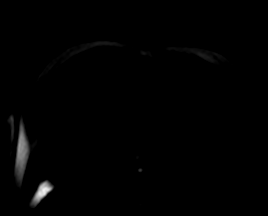

[Series 7: DWI · axial · 5.0mm · 1.42mm/px · 1 of 38 slices shown (2 of 2)]
[im 1/38]
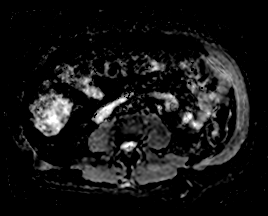

[Series 8: T2 · axial · 5.0mm · 1.48mm/px · z∈[-153,+93]mm · 2 of 42 slices shown (2 of 4)]
[im 1/42]
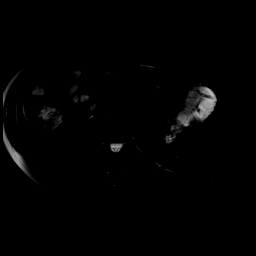
[im 42/42]
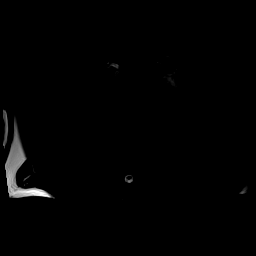

[Series 11: T2 · axial · 6.0mm · 1.22mm/px · 1 of 30 slices shown (3 of 4)]
[im 1/30]
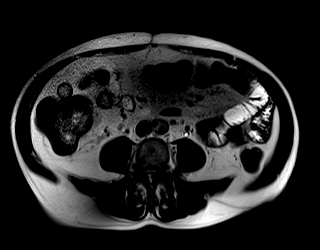

[Series 12: T2 · coronal · 3.0mm · 1.19mm/px · 1 of 23 slices shown (4 of 4)]
[im 1/23]
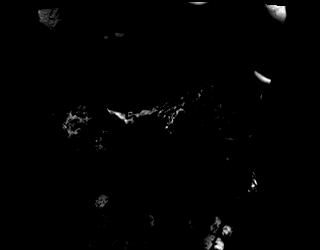

[Series 13: bSSFP · axial · 5.0mm · 1.25mm/px · 1 of 38 slices shown]
[im 1/38]
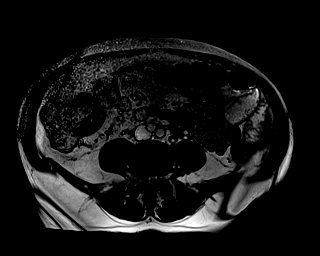

[Series 14: MRCP · coronal · 1.0mm · 0.49mm/px · 3 of 72 slices shown]
[im 1/72]
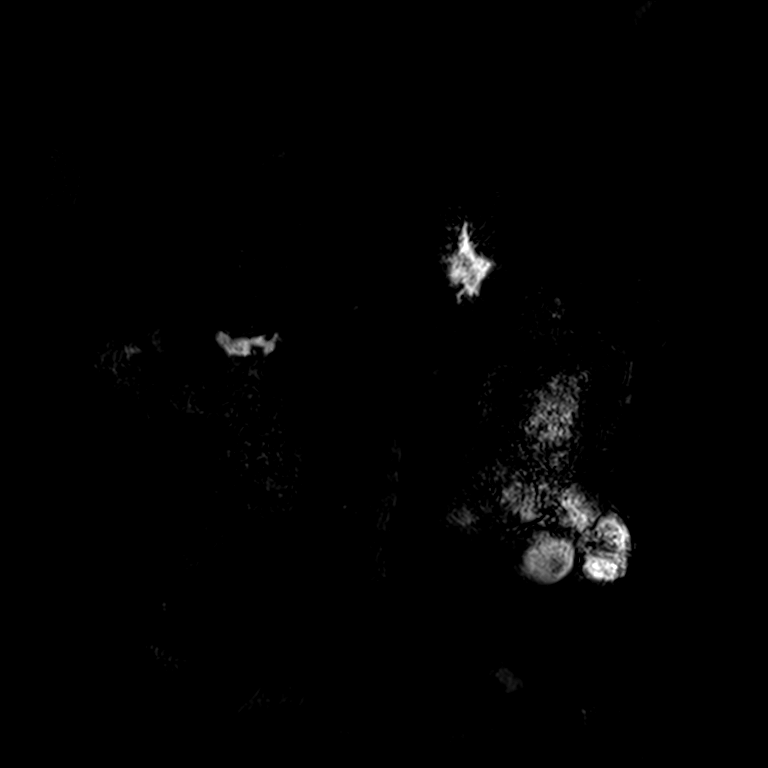
[im 36/72]
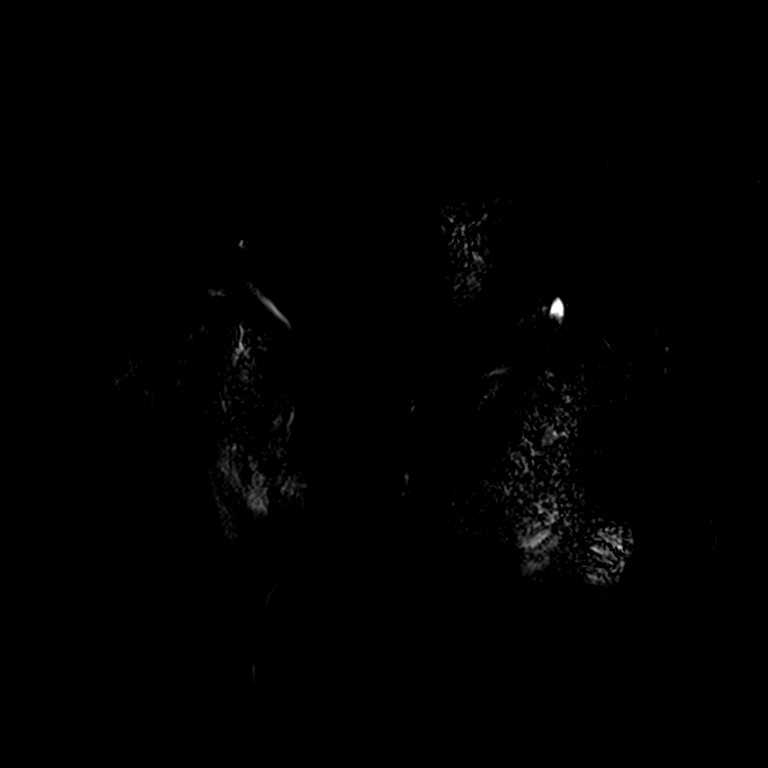
[im 72/72]
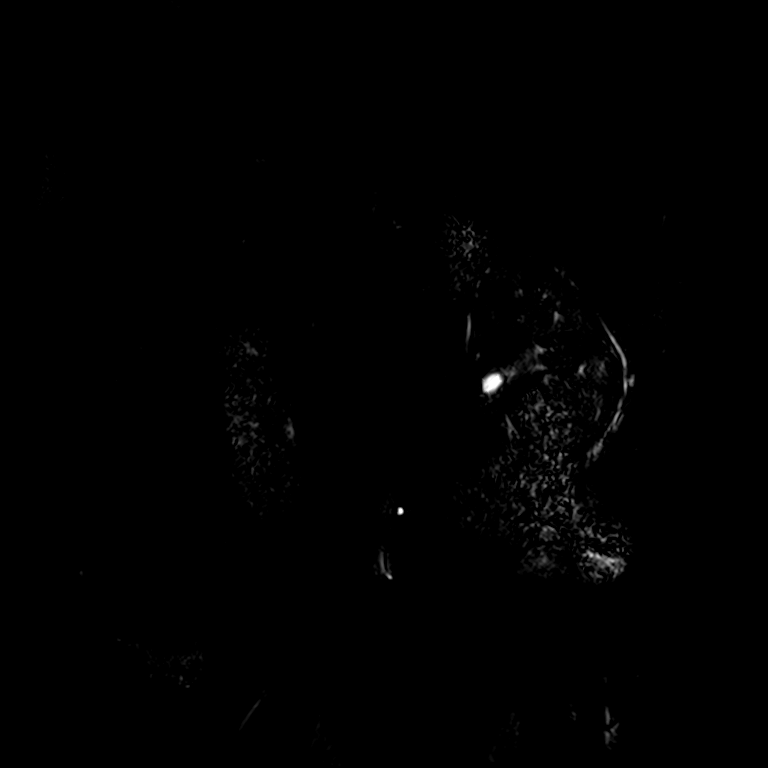

[Series 16: T1 dynamic · axial · non-contrast · 3.0mm · 1.25mm/px · z∈[-159,+54]mm · 3 of 72 slices shown]
[im 1/72]
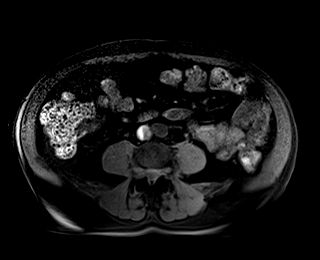
[im 36/72]
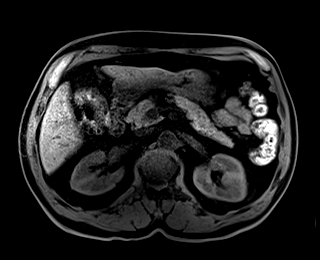
[im 72/72]
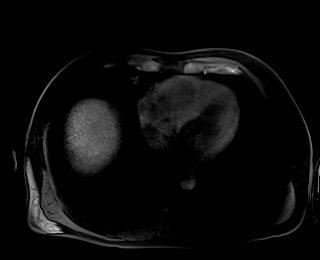

[Series 17: T1 dynamic post-contrast · axial · 3.0mm · 1.25mm/px · z∈[-159,+54]mm · 3 of 72 slices shown (1 of 6)]
[im 1/72]
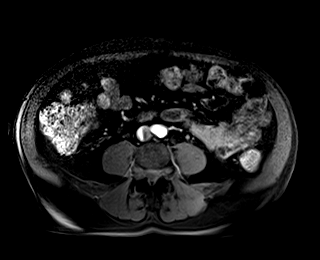
[im 36/72]
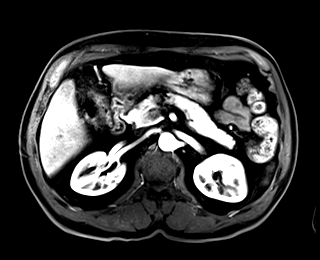
[im 72/72]
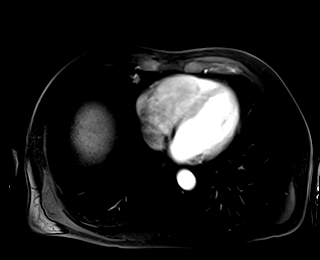

[Series 18: T1 dynamic post-contrast · axial · 3.0mm · 1.25mm/px · z∈[-159,+54]mm · 3 of 72 slices shown (2 of 6)]
[im 1/72]
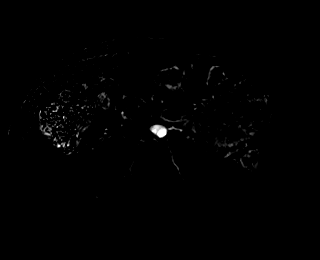
[im 36/72]
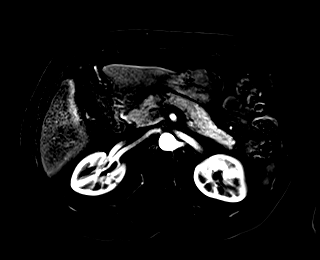
[im 72/72]
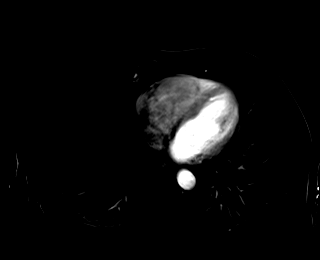

[Series 19: T1 dynamic post-contrast · axial · 3.0mm · 1.25mm/px · z∈[-159,+54]mm · 3 of 72 slices shown (3 of 6)]
[im 1/72]
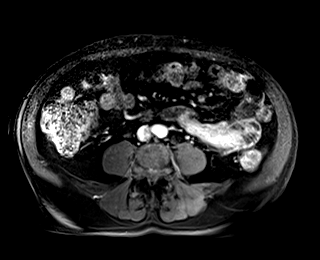
[im 36/72]
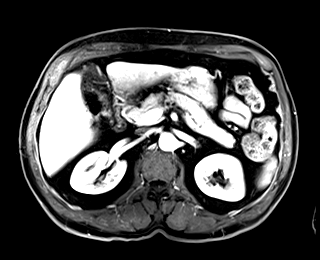
[im 72/72]
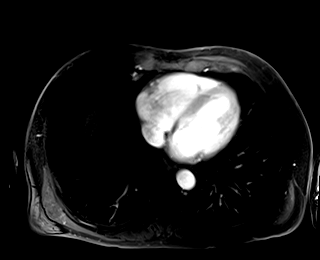

[Series 20: T1 dynamic post-contrast · axial · 3.0mm · 1.25mm/px · z∈[-159,+54]mm · 3 of 72 slices shown (4 of 6)]
[im 1/72]
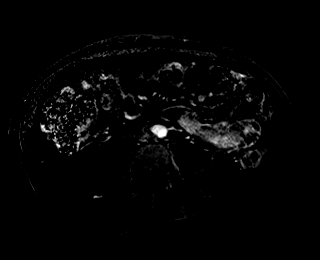
[im 36/72]
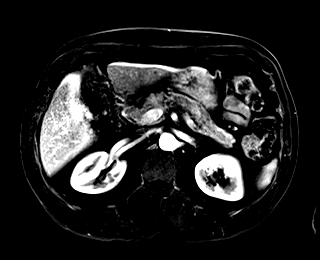
[im 72/72]
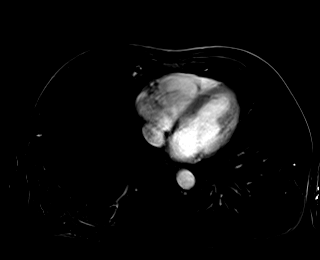

[Series 21: T1 dynamic post-contrast · axial · 3.0mm · 1.25mm/px · z∈[-159,+54]mm · 3 of 72 slices shown (5 of 6)]
[im 1/72]
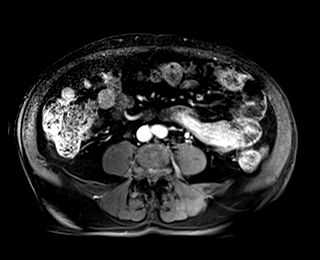
[im 36/72]
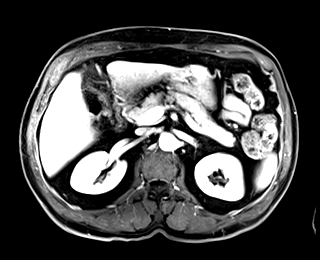
[im 72/72]
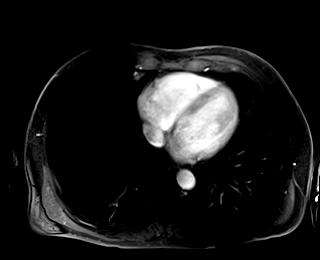

[Series 22: T1 dynamic post-contrast · axial · 3.0mm · 1.25mm/px · z∈[-159,-54]mm · 2 of 72 slices shown (6 of 6)]
[im 1/72]
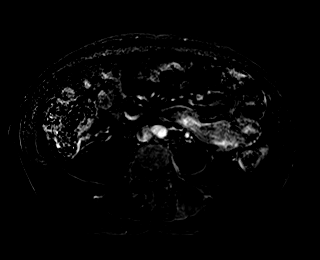
[im 36/72]
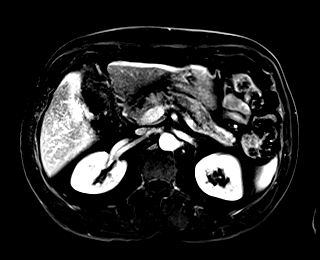

[38 of 48 positions shown; findings below may reference images not displayed]

FINDINGS: Lower chest: No acute abnormality at the lung bases.

Hepatobiliary: Normal liver size and configuration. No hepatic
steatosis. Subcentimeter simple segment 3 left liver cyst. No
suspicious liver masses. Cholecystectomy. No biliary ductal
dilatation. Common bile duct diameter 2 mm. No choledocholithiasis.
No biliary masses, strictures or beading. Small periampullary
duodenal diverticulum.

Pancreas: Cystic 0.6 cm pancreatic tail lesion (series 8/image 19)
demonstrates no enhancement, wall thickening or thickened internal
septations, previously 0.6 cm, stable. No new pancreatic lesions. No
pancreatic duct dilation. No pancreas divisum.

Spleen: Normal size. No mass.

Adrenals/Urinary Tract: Normal adrenals. No hydronephrosis. There is
an exophytic 0.8 cm T2 hypointense renal cortical lesion in the
lateral lower left kidney (series 11/image 20) demonstrating uniform
enhancement on the subtraction sequences, previously 0.8 cm on
03/26/2019 MRI, stable. Several simple subcentimeter renal cortical
cysts scattered in both kidneys.

Stomach/Bowel: Normal non-distended stomach. Visualized small and
large bowel is normal caliber, with no bowel wall thickening.

Vascular/Lymphatic: Normal caliber abdominal aorta. Patent portal,
splenic, hepatic and renal veins. No evidence of renal vein tumor
thrombus. No pathologically enlarged lymph nodes in the abdomen.

Other: No abdominal ascites or focal fluid collection.

Musculoskeletal: No aggressive appearing focal osseous lesions.
IMPRESSION: 1. Stable enhancing exophytic 0.8 cm renal cortical mass in the
lateral lower left kidney, compatible with renal cell carcinoma. No
evidence of renal vein tumor thrombus.
2. No evidence of metastatic disease in the abdomen.
3. Stable tiny 0.6 cm pancreatic tail lesion without high risk MRI
features. Follow-up MRI abdomen without and with IV contrast
recommended in 2 years. This recommendation follows ACR consensus
guidelines: Management of Incidental Pancreatic Cysts: A White Paper
of the ACR Incidental Findings Committee. [HOSPITAL]

## 2020-11-21 DIAGNOSIS — Z23 Encounter for immunization: Secondary | ICD-10-CM | POA: Diagnosis not present

## 2020-12-31 ENCOUNTER — Other Ambulatory Visit: Payer: Self-pay | Admitting: Urology

## 2020-12-31 ENCOUNTER — Other Ambulatory Visit (HOSPITAL_COMMUNITY): Payer: Self-pay | Admitting: Urology

## 2020-12-31 DIAGNOSIS — D49512 Neoplasm of unspecified behavior of left kidney: Secondary | ICD-10-CM

## 2021-01-09 ENCOUNTER — Other Ambulatory Visit: Payer: Self-pay

## 2021-01-09 DIAGNOSIS — R739 Hyperglycemia, unspecified: Secondary | ICD-10-CM

## 2021-01-12 ENCOUNTER — Other Ambulatory Visit: Payer: Medicare Other

## 2021-01-12 ENCOUNTER — Other Ambulatory Visit: Payer: Self-pay

## 2021-01-12 DIAGNOSIS — R739 Hyperglycemia, unspecified: Secondary | ICD-10-CM

## 2021-01-12 DIAGNOSIS — N289 Disorder of kidney and ureter, unspecified: Secondary | ICD-10-CM

## 2021-01-12 DIAGNOSIS — I2583 Coronary atherosclerosis due to lipid rich plaque: Secondary | ICD-10-CM

## 2021-01-12 DIAGNOSIS — I2510 Atherosclerotic heart disease of native coronary artery without angina pectoris: Secondary | ICD-10-CM

## 2021-01-13 LAB — COMPLETE METABOLIC PANEL WITH GFR
AG Ratio: 2.3 (calc) (ref 1.0–2.5)
ALT: 22 U/L (ref 9–46)
AST: 16 U/L (ref 10–35)
Albumin: 4.5 g/dL (ref 3.6–5.1)
Alkaline phosphatase (APISO): 98 U/L (ref 35–144)
BUN: 20 mg/dL (ref 7–25)
CO2: 26 mmol/L (ref 20–32)
Calcium: 8.8 mg/dL (ref 8.6–10.3)
Chloride: 103 mmol/L (ref 98–110)
Creat: 0.86 mg/dL (ref 0.70–1.28)
Globulin: 2 g/dL (calc) (ref 1.9–3.7)
Glucose, Bld: 116 mg/dL — ABNORMAL HIGH (ref 65–99)
Potassium: 3.9 mmol/L (ref 3.5–5.3)
Sodium: 139 mmol/L (ref 135–146)
Total Bilirubin: 0.4 mg/dL (ref 0.2–1.2)
Total Protein: 6.5 g/dL (ref 6.1–8.1)
eGFR: 90 mL/min/{1.73_m2} (ref >=60)

## 2021-01-13 LAB — HEMOGLOBIN A1C
Hgb A1c MFr Bld: 6.2 % of total Hgb — ABNORMAL HIGH (ref <5.7)
Mean Plasma Glucose: 131 mg/dL
eAG (mmol/L): 7.3 mmol/L

## 2021-01-15 ENCOUNTER — Ambulatory Visit: Payer: Medicare Other | Admitting: Internal Medicine

## 2021-01-20 ENCOUNTER — Other Ambulatory Visit: Payer: Self-pay

## 2021-01-20 ENCOUNTER — Encounter: Payer: Self-pay | Admitting: Family Medicine

## 2021-01-20 ENCOUNTER — Ambulatory Visit (INDEPENDENT_AMBULATORY_CARE_PROVIDER_SITE_OTHER): Payer: Medicare Other | Admitting: Family Medicine

## 2021-01-20 VITALS — BP 136/64 | HR 66 | Temp 97.8°F | Ht 70.0 in | Wt 180.2 lb

## 2021-01-20 DIAGNOSIS — E7800 Pure hypercholesterolemia, unspecified: Secondary | ICD-10-CM

## 2021-01-20 DIAGNOSIS — N289 Disorder of kidney and ureter, unspecified: Secondary | ICD-10-CM | POA: Diagnosis not present

## 2021-01-20 DIAGNOSIS — I2510 Atherosclerotic heart disease of native coronary artery without angina pectoris: Secondary | ICD-10-CM | POA: Diagnosis not present

## 2021-01-20 DIAGNOSIS — I2583 Coronary atherosclerosis due to lipid rich plaque: Secondary | ICD-10-CM

## 2021-01-20 DIAGNOSIS — R739 Hyperglycemia, unspecified: Secondary | ICD-10-CM

## 2021-01-20 NOTE — Patient Instructions (Signed)
Remember: Moderation is the key to carbohydrate in your juice intake                     follow-up with urology about the spot on your kidney as well as the stress incontinence                     follow-up with one of our gastroenterology about scheduling a colonoscopy

## 2021-01-20 NOTE — Progress Notes (Signed)
Provider:  Alain Honey, MD  Careteam: Patient Care Team: Wardell Honour, MD as PCP - General (Family Medicine) Gaynelle Arabian, MD as Consulting Physician (Orthopedic Surgery) Jarome Matin, MD as Consulting Physician (Dermatology) Raynelle Bring, MD as Consulting Physician (Urology)  PLACE OF SERVICE:  [place] Directive information    No Known Allergies  Chief Complaint  Patient presents with   Annual Exam    Patient presents today for annual exam.     HPI: Patient is a 75 y.o. male is here today for annual physical.  Was last seen a year ago by Dr. Mariea Clonts.  Problems include enlarged prostate and status post cancer.  He has upcoming follow-up with urology to evaluate this as well as new incontinence as well as a spot on his kidney.  CT scan will be part of that exam. Is also concerned about his blood sugars.  He has been told for some years he has prediabetes but his A1c's and fasting sugars have not changed and he continues.  He is concerned that he feels like he has gained some abdominal girth but his weight is the same today as it was 1 year ago. Also complains of some left knee pain.  He is status post meniscectomy.  He is still able to do most of what he wants to do with increased pain afterwards. He has been told that his cholesterol is elevated but total cholesterol is 165 with LDL at 90.  Triglycerides were slightly elevated but not high enough to treat.  Review of Systems:  Review of Systems  Constitutional: Negative.   HENT: Negative.    Eyes: Negative.   Respiratory: Negative.    Cardiovascular: Negative.   Gastrointestinal: Negative.   Genitourinary: Negative.   Musculoskeletal:  Positive for back pain.  Neurological: Negative.   Psychiatric/Behavioral: Negative.    All other systems reviewed and are negative.  Past Medical History:  Diagnosis Date   Basal cell carcinoma (BCC)    BPH (benign prostatic hyperplasia)    Cellulitis     Eczema    Hyperlipidemia    Kidney stone    Plantar fasciitis    Prostate cancer ([place])    Right shoulder pain    Sinusitis    Spinal stenosis    Squamous cell carcinoma in situ    Past Surgical History:  Procedure Laterality Date   CHOLECYSTECTOMY N/A 05/24/2013   Procedure: LAPAROSCOPIC CHOLECYSTECTOMY;  Surgeon: Edward Jolly, MD;  Location: [facility];  Service: General;  Laterality: N/A;   COLONOSCOPY  1996   polp removal   KNEE SURGERY     LITHOTRIPSY     MRI     sinus cauterization  1970   TONSILLECTOMY     Social History:   reports that he has never smoked. He has never used smokeless tobacco. He reports that he does not drink alcohol and does not use drugs.  Family History  Problem Relation Age of Onset   Melanoma Brother    Heart disease Father        died from. Cerebral Hemorrage, smoker and ETOH    Other Mother        Organ Failure, no specified per [facility] new patient packet    Osteoporosis Sister    Diverticulitis Sister    Thyroid disease Sister    Breast cancer Sister    Diverticulitis Sister    Depression Son    Alcoholism Son    Pneumonia Nephew  Medications: Patient's Medications  New Prescriptions   No medications on file  Previous Medications   ACETAMINOPHEN (TYLENOL) 500 MG TABLET    Take 500 mg by mouth as needed.   ASCORBIC ACID (VITAMIN C) 1000 MG TABLET    Take 1,000 mg by mouth daily.    BIOTIN PO    Take 1,000 mcg by mouth daily.    CHOLECALCIFEROL (VITAMIN D-3 PO)    Take 1,000 mg by mouth daily.    FINASTERIDE (PROSCAR) 5 MG TABLET    Take 5 mg by mouth daily.   GLUCOS-CHONDROIT-COLLAG-HYAL (GLUCOSAMINE CHONDROIT-COLLAGEN PO)    Take 1 capsule by mouth 2 (two) times daily.    HYDROCORTISONE 2.5 % LOTION    Apply topically 2 (two) times daily.   MELATONIN PO    Take 8 mg by mouth at bedtime.   MULTIPLE VITAMIN (MULTIVITAMIN) TABLET    Take 1 tablet by mouth daily.   NEOMYCIN-BACITRACIN-POLYMYXIN (NEOSPORIN) OINTMENT    Apply 1  application topically as needed for wound care.   TAMSULOSIN (FLOMAX) 0.4 MG CAPS CAPSULE    Take 0.4 mg by mouth in the morning and at bedtime. Prescribed by urologist    TRIAMCINOLONE OINTMENT (KENALOG) 0.1 %    Apply 1 application topically as needed. Uses for dermatitis type issues on his hands   TURMERIC CURCUMIN 500 MG CAPS    Take by mouth daily.  Modified Medications   No medications on file  Discontinued Medications   DOXYCYCLINE (VIBRAMYCIN) 100 MG CAPSULE    Take 100 mg by mouth 2 (two) times daily. Takes 7 days out of the month per eye doctor    Physical Exam:  Vitals:   01/20/21 1518  BP: 136/64  Pulse: 66  Temp: 97.8 F (36.6 C)  TempSrc: Temporal  SpO2: 98%  Weight: 180 lb 3.2 oz (81.7 kg)  Height: '5\' 10"'$  (1.778 m)   Body mass index is 25.86 kg/m. Wt Readings from Last 3 Encounters:  01/20/21 180 lb 3.2 oz (81.7 kg)  01/14/20 177 lb 9.6 oz (80.6 kg)  06/07/19 175 lb (79.4 kg)    Physical Exam Vitals and nursing note reviewed.  Constitutional:      Appearance: Normal appearance.  HENT:     Head: Normocephalic.     Left Ear: Tympanic membrane normal.  Neck:     Vascular: No carotid bruit.  Cardiovascular:     Rate and Rhythm: Normal rate and regular rhythm.     Pulses: Normal pulses.     Heart sounds: Normal heart sounds.  Pulmonary:     Effort: Pulmonary effort is normal.     Breath sounds: Normal breath sounds.  Musculoskeletal:        General: Normal range of motion.     Cervical back: Normal range of motion.  Skin:    General: Skin is warm.     Comments: Hemangioma on left mid back  Neurological:     General: No focal deficit present.     Mental Status: He is alert and oriented to person, place, and time.  Psychiatric:        Mood and Affect: Mood normal.        Behavior: Behavior normal.    Labs reviewed: Basic Metabolic Panel: Recent Labs    01/12/21 0804  NA 139  K 3.9  CL 103  CO2 26  GLUCOSE 116*  BUN 20  CREATININE 0.86   CALCIUM 8.8   Liver Function Tests: Recent Labs  01/12/21 0804  AST 16  ALT 22  BILITOT 0.4  PROT 6.5   No results for input(s): LIPASE, AMYLASE in the last 8760 hours. No results for input(s): AMMONIA in the last 8760 hours. CBC: No results for input(s): WBC, NEUTROABS, HGB, HCT, MCV, PLT in the last 8760 hours. Lipid Panel: No results for input(s): CHOL, HDL, LDLCALC, TRIG, CHOLHDL, LDLDIRECT in the last 8760 hours. TSH: No results for input(s): TSH in the last 8760 hours. A1C: Lab Results  Component Value Date   HGBA1C 6.2 (H) 01/12/2021     Assessment/Plan  1. Hyperglycemia No treatment necessary.  He is prediabetes as we spent time discussing he needs to watch his weight and eat and drink carbs and juices in moderation   pure hypercholesterolemia I believe this diagnosis is in error since his numbers are all good  3. Renal lesion Probable renal cancer.  This is being followed by urology   Alain Honey, MD [place] [phone]

## 2021-01-25 ENCOUNTER — Emergency Department (HOSPITAL_COMMUNITY)
Admission: EM | Admit: 2021-01-25 | Discharge: 2021-01-25 | Disposition: A | Discharge status: Home / Self Care | Payer: Medicare Other | Attending: Emergency Medicine | Admitting: Emergency Medicine

## 2021-01-25 DIAGNOSIS — L988 Other specified disorders of the skin and subcutaneous tissue: Secondary | ICD-10-CM | POA: Insufficient documentation

## 2021-01-25 DIAGNOSIS — Z85828 Personal history of other malignant neoplasm of skin: Secondary | ICD-10-CM | POA: Diagnosis not present

## 2021-01-25 DIAGNOSIS — Z8546 Personal history of malignant neoplasm of prostate: Secondary | ICD-10-CM | POA: Insufficient documentation

## 2021-01-25 DIAGNOSIS — L989 Disorder of the skin and subcutaneous tissue, unspecified: Secondary | ICD-10-CM | POA: Diagnosis not present

## 2021-01-25 NOTE — ED Triage Notes (Signed)
Pt felt an area on the top of head this morning, and was concerned it could be a tick since he was working in the yard yesterday afternoon

## 2021-01-25 NOTE — Discharge Instructions (Addendum)
Please follow up with your dermatologist for further evaluation of small lesion on your scalp.

## 2021-01-25 NOTE — ED Provider Notes (Signed)
[facility] EMERGENCY DEPARTMENT Provider Note   CSN: [id number] Arrival date & time: 01/25/21  0544     History Chief Complaint  Patient presents with   Skin Problem    Joseph Marsh is a 75 y.o. male.  Joseph Marsh is a 75 y.o. male with a history of basal cell carcinoma, squamous cell carcinoma, hyperlipidemia, BPH, who presents to the emergency department for evaluation of a bump that he noticed on his scalp.  Patient reports when he woke up early this morning to go to the bathroom he felt his scalp and felt a small bump just below the crown of his head.  He tried to use a red light but was not able to see spots.  He spent many hours working outside yesterday and was concerned that this could be a tick.  He did see a tick on the ground but has not found any on himself.  He has not felt any similar bumps or lesions elsewhere.  No trauma or injury to the head.  No other aggravating or alleviating factors.  The history is provided by the patient.      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   Renal lesion 05/03/2019   Dermatitis 06/19/2018   Hyperglycemia 06/19/2018   Pure hypercholesterolemia 06/19/2018   Sinus congestion 06/19/2018   Right arm pain 06/19/2018   Plantar fasciitis of right foot 06/19/2018   Spinal stenosis of lumbar region without neurogenic claudication 06/19/2018   Cholelithiasis with acute cholecystitis 05/24/2013   Acute calculous cholecystitis 05/24/2013    Past Surgical History:  Procedure Laterality Date   CHOLECYSTECTOMY N/A 05/24/2013   Procedure: LAPAROSCOPIC  CHOLECYSTECTOMY;  Surgeon: Edward Jolly, MD;  Location: [facility];  Service: General;  Laterality: N/A;   COLONOSCOPY  1996   polp removal   KNEE SURGERY     LITHOTRIPSY     MRI     sinus cauterization  1970   TONSILLECTOMY         Family History  Problem Relation Age of Onset   Melanoma Brother    Heart disease Father        died from. Cerebral Hemorrage, smoker and ETOH    Other Mother        Organ Failure, no specified per [facility] new patient packet    Osteoporosis Sister    Diverticulitis Sister    Thyroid disease Sister    Breast cancer Sister    Diverticulitis Sister    Depression Son    Alcoholism Son    Pneumonia Nephew     Social History   Tobacco Use   Smoking status: Never   Smokeless tobacco: Never  Vaping Use   Vaping Use: Never used  Substance Use Topics   Alcohol use: No   Drug use: No    Home Medications Prior to Admission medications   Medication Sig Start Date End Date Taking? Authorizing Provider  acetaminophen (TYLENOL) 500 MG tablet Take 500 mg by mouth as needed.  [provider]  Ascorbic Acid (VITAMIN C) 1000 MG tablet Take 1,000 mg by mouth daily.     [provider]  BIOTIN PO Take 1,000 mcg by mouth daily.     [provider]  Cholecalciferol (VITAMIN D-3 PO) Take 1,000 mg by mouth daily.     [provider]  finasteride (PROSCAR) 5 MG tablet Take 5 mg by mouth daily.    [provider]  Glucos-Chondroit-Collag-Hyal (GLUCOSAMINE CHONDROIT-COLLAGEN PO) Take 1 capsule by mouth 2 (two) times daily.     [provider]  hydrocortisone 2.5 % lotion Apply topically 2 (two) times daily. 12/29/19   Drenda Freeze, MD  MELATONIN PO Take 8 mg by mouth at bedtime.    [provider]  Multiple Vitamin (MULTIVITAMIN) tablet Take 1 tablet by mouth daily.    [provider]  neomycin-bacitracin-polymyxin (NEOSPORIN) ointment Apply 1 application topically as needed for wound  care.    [provider]  tamsulosin (FLOMAX) 0.4 MG CAPS capsule Take 0.4 mg by mouth in the morning and at bedtime. Prescribed by urologist     [provider]  triamcinolone ointment (KENALOG) 0.1 % Apply 1 application topically as needed. Uses for dermatitis type issues on his hands    [provider]  Turmeric Curcumin 500 MG CAPS Take by mouth daily.    [provider]    Allergies    Patient has no known allergies.  Review of Systems   Review of Systems  Constitutional:  Negative for chills and fever.  Skin:        lesion   Physical Exam Updated Vital Signs BP 137/84   Pulse 88   Temp 98.4 F (36.9 C)   Resp 17   SpO2 98%   Physical Exam Vitals and nursing note reviewed.  Constitutional:      General: He is not in acute distress.    Appearance: Normal appearance. He is well-developed. He is not ill-appearing or diaphoretic.  HENT:     Head: Normocephalic and atraumatic.     Comments: .75 cm grayish-purple papule noted on the crown of the scalp, no surrounding erythema, no pustules or vesicles and no other lesions on the scalp.  No ticks or insects noted.  See photo below. Eyes:     General:        Right eye: No discharge.        Left eye: No discharge.  Pulmonary:     Effort: Pulmonary effort is normal. No respiratory distress.  Neurological:     Mental Status: He is alert and oriented to person, place, and time.     Coordination: Coordination normal.  Psychiatric:        Mood and Affect: Mood normal.        Behavior: Behavior normal.     ED Results / Procedures / Treatments   Labs (all labs ordered are listed, but only abnormal results are displayed) Labs Reviewed - No data to display  EKG None  Radiology No results found.  Procedures Procedures   Medications Ordered in ED Medications - No data to display  ED Course  I have reviewed the triage vital signs and the nursing notes.  Pertinent labs & imaging  results that were available during my care of the patient were reviewed by me and considered in my medical decision making (see chart for details).    MDM Rules/Calculators/A&P  75 year old male presents felt he felt on his scalp that he could not personally see.  On exam he has a small grayish-purple papule noted over the crown of head.  Does have history of basal cell carcinoma which this lesion is somewhat concerning for.  Lesion is not painful, no signs of infection and no similar lesions noted elsewhere.  No ticks or insects noted.  Patient has an upcoming follow-up appointment with his dermatologist we will have him discuss this lesion at that time.  Discharged home in good condition.  Final Clinical Impression(s) / ED Diagnoses Final diagnoses:  Scalp lesion    Rx / DC Orders ED Discharge Orders     None        Janet Berlin 01/25/21 Renella Cunas, MD 01/25/21 [phone]

## 2021-02-09 ENCOUNTER — Other Ambulatory Visit: Payer: Self-pay

## 2021-02-09 ENCOUNTER — Ambulatory Visit (HOSPITAL_COMMUNITY)
Admission: RE | Admit: 2021-02-09 | Discharge: 2021-02-09 | Disposition: A | Discharge status: Home / Self Care | Payer: Medicare Other | Source: Ambulatory Visit | Attending: Urology | Admitting: Urology

## 2021-02-09 DIAGNOSIS — D49512 Neoplasm of unspecified behavior of left kidney: Secondary | ICD-10-CM | POA: Insufficient documentation

## 2021-02-09 DIAGNOSIS — Z8546 Personal history of malignant neoplasm of prostate: Secondary | ICD-10-CM | POA: Diagnosis not present

## 2021-02-09 DIAGNOSIS — Z9049 Acquired absence of other specified parts of digestive tract: Secondary | ICD-10-CM | POA: Diagnosis not present

## 2021-02-09 DIAGNOSIS — K862 Cyst of pancreas: Secondary | ICD-10-CM | POA: Diagnosis not present

## 2021-02-09 DIAGNOSIS — N2889 Other specified disorders of kidney and ureter: Secondary | ICD-10-CM | POA: Diagnosis not present

## 2021-02-09 DIAGNOSIS — N281 Cyst of kidney, acquired: Secondary | ICD-10-CM | POA: Diagnosis not present

## 2021-02-09 MED ORDER — GADOBUTROL 1 MMOL/ML IV SOLN
8.0000 mL | Freq: Once | INTRAVENOUS | Status: AC | PRN
  Administered 2021-02-09: 8 mL via INTRAVENOUS

## 2021-02-11 DIAGNOSIS — L821 Other seborrheic keratosis: Secondary | ICD-10-CM | POA: Diagnosis not present

## 2021-02-11 DIAGNOSIS — Z85828 Personal history of other malignant neoplasm of skin: Secondary | ICD-10-CM | POA: Diagnosis not present

## 2021-02-11 DIAGNOSIS — L308 Other specified dermatitis: Secondary | ICD-10-CM | POA: Diagnosis not present

## 2021-02-11 DIAGNOSIS — D1801 Hemangioma of skin and subcutaneous tissue: Secondary | ICD-10-CM | POA: Diagnosis not present

## 2021-02-11 DIAGNOSIS — D1721 Benign lipomatous neoplasm of skin and subcutaneous tissue of right arm: Secondary | ICD-10-CM | POA: Diagnosis not present

## 2021-02-11 DIAGNOSIS — D225 Melanocytic nevi of trunk: Secondary | ICD-10-CM | POA: Diagnosis not present

## 2021-02-18 DIAGNOSIS — N401 Enlarged prostate with lower urinary tract symptoms: Secondary | ICD-10-CM | POA: Diagnosis not present

## 2021-02-18 DIAGNOSIS — D49512 Neoplasm of unspecified behavior of left kidney: Secondary | ICD-10-CM | POA: Diagnosis not present

## 2021-02-18 DIAGNOSIS — Z8546 Personal history of malignant neoplasm of prostate: Secondary | ICD-10-CM | POA: Diagnosis not present

## 2021-02-18 DIAGNOSIS — R3912 Poor urinary stream: Secondary | ICD-10-CM | POA: Diagnosis not present

## 2021-02-19 DIAGNOSIS — H2513 Age-related nuclear cataract, bilateral: Secondary | ICD-10-CM | POA: Diagnosis not present

## 2021-02-19 DIAGNOSIS — H04123 Dry eye syndrome of bilateral lacrimal glands: Secondary | ICD-10-CM | POA: Diagnosis not present

## 2021-02-19 DIAGNOSIS — H25013 Cortical age-related cataract, bilateral: Secondary | ICD-10-CM | POA: Diagnosis not present

## 2021-02-19 DIAGNOSIS — H40013 Open angle with borderline findings, low risk, bilateral: Secondary | ICD-10-CM | POA: Diagnosis not present

## 2021-02-26 ENCOUNTER — Other Ambulatory Visit: Payer: Self-pay

## 2021-02-26 ENCOUNTER — Telehealth: Payer: Self-pay

## 2021-02-26 ENCOUNTER — Encounter: Payer: Self-pay | Admitting: Nurse Practitioner

## 2021-02-26 ENCOUNTER — Ambulatory Visit (INDEPENDENT_AMBULATORY_CARE_PROVIDER_SITE_OTHER): Payer: Medicare Other | Admitting: Nurse Practitioner

## 2021-02-26 DIAGNOSIS — Z0000 Encounter for general adult medical examination without abnormal findings: Secondary | ICD-10-CM

## 2021-02-26 DIAGNOSIS — M6281 Muscle weakness (generalized): Secondary | ICD-10-CM | POA: Diagnosis not present

## 2021-02-26 DIAGNOSIS — R338 Other retention of urine: Secondary | ICD-10-CM | POA: Diagnosis not present

## 2021-02-26 DIAGNOSIS — M6289 Other specified disorders of muscle: Secondary | ICD-10-CM | POA: Diagnosis not present

## 2021-02-26 DIAGNOSIS — R3912 Poor urinary stream: Secondary | ICD-10-CM | POA: Diagnosis not present

## 2021-02-26 DIAGNOSIS — M62838 Other muscle spasm: Secondary | ICD-10-CM | POA: Diagnosis not present

## 2021-02-26 NOTE — Progress Notes (Signed)
Subjective:   Joseph Marsh is a 75 y.o. male who presents for Medicare Annual/Subsequent preventive examination.  Review of Systems     Cardiac Risk Factors include: advanced age (>56mn, >>53women);dyslipidemia;male gender     Objective:    There were no vitals filed for this visit. There is no height or weight on file to calculate BMI.  Advanced Directives 02/26/2021 02/25/2020 01/14/2020 12/29/2019 06/07/2019 05/30/2019 05/03/2019  Does Patient Have a Medical Advance Directive? No No No No No No No  Would patient like information on creating a medical advance directive? - No - Patient declined - No - Patient declined No - Guardian declined No - Patient declined -  Pre-existing out of facility DNR order (yellow form or pink MOST form) - - - - - - -    Current Medications (verified) Outpatient Encounter Medications as of 02/26/2021  Medication Sig   acetaminophen (TYLENOL) 500 MG tablet Take 500 mg by mouth as needed.   Ascorbic Acid (VITAMIN C) 1000 MG tablet Take 1,000 mg by mouth daily.    BIOTIN PO Take 1,000 mcg by mouth daily.    Cholecalciferol (VITAMIN D-3 PO) Take 1,000 mg by mouth daily.    Cyanocobalamin (VITAMIN B 12 PO) Take by mouth.   finasteride (PROSCAR) 5 MG tablet Take 5 mg by mouth daily.   fluocinonide-emollient (LIDEX-E) 0.05 % cream Apply 1 application topically 2 (two) times daily.   Glucos-Chondroit-Collag-Hyal (GLUCOSAMINE CHONDROIT-COLLAGEN PO) Take 1 capsule by mouth 2 (two) times daily.    hydrocortisone 2.5 % lotion Apply topically 2 (two) times daily. (Patient taking differently: Apply topically 2 (two) times daily as needed.)   MELATONIN PO Take 8 mg by mouth at bedtime.   Multiple Vitamin (MULTIVITAMIN) tablet Take 1 tablet by mouth daily.   neomycin-bacitracin-polymyxin (NEOSPORIN) ointment Apply 1 application topically as needed for wound care.   tamsulosin (FLOMAX) 0.4 MG CAPS capsule Take 0.4 mg by mouth in the morning and at bedtime.  Prescribed by urologist    [DISCONTINUED] triamcinolone ointment (KENALOG) 0.1 % Apply 1 application topically as needed. Uses for dermatitis type issues on his hands   Turmeric Curcumin 500 MG CAPS Take by mouth daily.   No facility-administered encounter medications on file as of 02/26/2021.    Allergies (verified) Patient has no known allergies.   History: Past Medical History:  Diagnosis Date   Basal cell carcinoma (BCC)    BPH (benign prostatic hyperplasia)    Cellulitis    Eczema    Hyperlipidemia    Kidney stone    Plantar fasciitis    Prostate cancer ([facility]    Right shoulder pain    Sinusitis    Spinal stenosis    Squamous cell carcinoma in situ    Past Surgical History:  Procedure Laterality Date   CHOLECYSTECTOMY N/A 05/24/2013   Procedure: LAPAROSCOPIC CHOLECYSTECTOMY;  Surgeon: BEdward Jolly MD;  Location: [facility];  Service: General;  Laterality: N/A;   COLONOSCOPY  1996   polp removal   KNEE SURGERY     LITHOTRIPSY     MRI     sinus cauterization  1970   TONSILLECTOMY     Family History  Problem Relation Age of Onset   Melanoma Brother    Heart disease Father        died from. Cerebral Hemorrage, smoker and ETOH    Other Mother        [facility]  Osteoporosis Sister    Diverticulitis Sister    Thyroid disease Sister    Breast cancer Sister    Diverticulitis Sister    Depression Son    Alcoholism Son    Pneumonia Nephew    Social History   Socioeconomic History   Marital status: Divorced    Spouse name: Not on file   Number of children: 2   Years of education: Not on file   Highest education level: Not on file  Occupational History    Employer: [employer]  Tobacco Use   Smoking status: Never   Smokeless tobacco: Never  Vaping Use   Vaping Use: Never used  Substance and Sexual Activity   Alcohol use: No   Drug use: No   Sexual activity: Yes  Other Topics Concern   Not on file   Social History Narrative   As of 06/19/2018 [place] New Patient Packet:      Diet: Fruits, nuts, protein, carbohydrates, sodium, fats, veggies, and some seafood       Caffeine: Average 5 12oz sodas per week       Married, if yes what year: Divorced x 2, married 1970-1999 (1st time) and 2004-2016 (second time)       Do you live in a house, apartment, assisted living, condo, trailer, ect: 3 bed room house, 1 stories, one person       Pets: No      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 No   Do you have difficulty preparing food or eating? No   Do you have difficulty managing your medications? No   Do you have difficulty managing your finances? No   Do you have difficulty affording your medications? No   Social Determinants of Radio broadcast assistant Strain: Not on file  Food Insecurity: Not on file  Transportation Needs: Not on file  Physical Activity: Not on file  Stress: Not on file  Social Connections: Not on file    Tobacco Counseling Counseling given: Not Answered   Clinical Intake:  Pre-visit preparation completed: Yes  Pain : No/denies pain     BMI - recorded: 25 Nutritional Risks: None Diabetes: No  How often do you need to have someone help you when you read instructions, pamphlets, or other written materials from your doctor or pharmacy?: 1 - Never  Diabetic?no         Activities of Daily Living In your present state of health, do you have any difficulty performing the following activities: 02/26/2021  Hearing? N  Vision? N  Difficulty concentrating or making decisions? N  Walking or climbing stairs? N  Dressing or bathing? N  Doing errands, shopping? N  Preparing Food and eating ? N  Using the Toilet?  N  In the past six months, have you accidently leaked urine? Y  Comment new  Do you have problems with loss of bowel control? N  Managing your Medications? N  Managing your Finances? N  Housekeeping or managing your Housekeeping? N  Some recent data might be hidden    Patient Care Team: Wardell Honour, MD as PCP - General (Family Medicine) Gaynelle Arabian, MD as Consulting Physician (  Orthopedic Surgery) Jarome Matin, MD as Consulting Physician (Dermatology) Raynelle Bring, MD as Consulting Physician (Urology)  Indicate any recent Medical Services you may have received from other than [facility] providers in the past year (date may be approximate).     Assessment:   This is a routine wellness examination for Joseph Marsh.  Hearing/Vision screen Hearing Screening - Comments:: Patient has no hearing problems. Vision Screening - Comments:: Patient has cataracts. Patient wears glasses. Last eye exam was last week. Patient sees Dr. Kathlen Mody  Dietary issues and exercise activities discussed: Current Exercise Habits: The patient does not participate in regular exercise at present   Goals Addressed   None    Depression Screen PHQ 2/9 Scores 02/26/2021 01/20/2021 02/25/2020 01/14/2020 06/07/2019 11/09/2018 10/30/2018  PHQ - 2 Score 0 0 0 0 0 0 0    Fall Risk Fall Risk  02/26/2021 01/20/2021 02/25/2020 01/14/2020 06/19/2019  Falls in the past year? 0 0 0 0 0  Number falls in past yr: 0 0 0 0 0  Injury with Fall? 0 0 0 0 0  Risk for fall due to : No Fall Risks No Fall Risks - - -  Follow up Falls evaluation completed Falls evaluation completed;Education provided;Falls prevention discussed - - -    FALL RISK PREVENTION PERTAINING TO THE HOME:  Any stairs in or around the home? Yes  If so, are there any without handrails? No  Home free of loose throw rugs in walkways, pet beds, electrical cords, etc? Yes  Adequate lighting in your home to reduce risk of falls? Yes   ASSISTIVE DEVICES UTILIZED TO PREVENT  FALLS:  Life alert? No  Use of a cane, walker or w/c? No  Grab bars in the bathroom? No  Shower chair or bench in shower? No  Elevated toilet seat or a handicapped toilet? No   TIMED UP AND GO:  Was the test performed? No .   Cognitive Function: MMSE - Mini Mental State Exam 10/30/2018  Orientation to time 5  Orientation to Place 5  Registration 3  Attention/ Calculation 5  Recall 0  Language- name 2 objects 2  Language- repeat 1  Language- follow 3 step command 3  Language- read & follow direction 1  Write a sentence 1  Copy design 1  Total score 27     6CIT Screen 02/26/2021 02/25/2020  What Year? 0 points 0 points  What month? 0 points 0 points  What time? 0 points 0 points  Count back from 20 0 points 0 points  Months in reverse 0 points 0 points  Repeat phrase 0 points 0 points  Total Score 0 0    Immunizations Immunization History  Administered Date(s) Administered   Fluad Quad(high Dose 65+) 03/12/2020   Influenza, High Dose Seasonal PF 02/15/2019   Influenza-Unspecified 04/16/2013, 06/24/2014, 06/19/2015, 06/28/2016, 04/14/2017, 03/14/2018, 05/19/2018   PFIZER(Purple Top)SARS-COV-2 Vaccination 07/20/2019, 08/15/2019, 04/11/2020, 11/21/2020   Pneumococcal Conjugate-13 07/23/2014   Pneumococcal Polysaccharide-23 12/18/2009   Pneumococcal-Unspecified 06/14/2016   Tdap 03/01/2011   Zoster Recombinat (Shingrix) 06/30/2018, 09/05/2018   Zoster, Live 03/01/2011    TDAP status: Up to date  Flu Vaccine status: Due, Education has been provided regarding the importance of this vaccine. Advised may receive this vaccine at local pharmacy or Health Dept. Aware to provide a copy of the vaccination record if obtained from local pharmacy or Health Dept. Verbalized acceptance and understanding.  Pneumococcal vaccine status: Up to date  Covid-19 vaccine status: Information provided on how to  obtain vaccines.   Qualifies for Shingles Vaccine? Yes   Zostavax completed  Yes   Shingrix Completed?: Yes  Screening Tests Health Maintenance  Topic Date Due   INFLUENZA VACCINE  01/12/2021   TETANUS/TDAP  02/28/2021   COVID-19 Vaccine (5 - Booster for Pfizer series) 03/23/2021   Hepatitis C Screening  Completed   PNA vac Low Risk Adult  Completed   Zoster Vaccines- Shingrix  Completed   HPV VACCINES  Aged Out    Health Maintenance  Health Maintenance Due  Topic Date Due   INFLUENZA VACCINE  01/12/2021    Colorectal cancer screening: No longer required.   Lung Cancer Screening: (Low Dose CT Chest recommended if Age 62-80 years, 30 pack-year currently smoking OR have quit w/in 15years.) does not qualify.   Lung Cancer Screening Referral: na  Additional Screening:  Hepatitis C Screening: does qualify; Completed 2021  Vision Screening: Recommended annual ophthalmology exams for early detection of glaucoma and other disorders of the eye. Is the patient up to date with their annual eye exam?  Yes  Who is the provider or what is the name of the office in which the patient attends annual eye exams? Kathlen Mody  If pt is not established with a provider, would they like to be referred to a provider to establish care? No .   Dental Screening: Recommended annual dental exams for proper oral hygiene  Community Resource Referral / Chronic Care Management: CRR required this visit?  No   CCM required this visit?  No      Plan:     I have personally reviewed and noted the following in the patient's chart:   Medical and social history Use of alcohol, tobacco or illicit drugs  Current medications and supplements including opioid prescriptions. Patient is not currently taking opioid prescriptions. Functional ability and status Nutritional status Physical activity Advanced directives List of other physicians Hospitalizations, surgeries, and ER visits in previous 12 months Vitals Screenings to include cognitive, depression, and falls Referrals and  appointments  In addition, I have reviewed and discussed with patient certain preventive protocols, quality metrics, and best practice recommendations. A written personalized care plan for preventive services as well as general preventive health recommendations were provided to patient.     Lauree Chandler, NP   02/26/2021    Virtual Visit via Telephone Note  I connected withNAME@ on 02/26/21 at  2:45 PM EDT by telephone and verified that I am speaking with the correct person using two identifiers.  Location: Patient: home Provider: [facility]   I discussed the limitations, risks, security and privacy concerns of performing an evaluation and management service by telephone and the availability of in person appointments. I also discussed with the patient that there may be a patient responsible charge related to this service. The patient expressed understanding and agreed to proceed.   I discussed the assessment and treatment plan with the patient. The patient was provided an opportunity to ask questions and all were answered. The patient agreed with the plan and demonstrated an understanding of the instructions.   The patient was advised to call back or seek an in-person evaluation if the symptoms worsen or if the condition fails to improve as anticipated.  I provided 17 minutes of non-face-to-face time during this encounter.  Joseph American. [name] Avs printed and mailed

## 2021-02-26 NOTE — Telephone Encounter (Signed)
Mr. zachary, frayser are scheduled for a virtual visit with your provider today.    Just as we do with appointments in the office, we must obtain your consent to participate.  Your consent will be active for this visit and any virtual visit you may have with one of our providers in the next 365 days.    If you have a MyChart account, I can also send a copy of this consent to you electronically.  All virtual visits are billed to your insurance company just like a traditional visit in the office.  As this is a virtual visit, video technology does not allow for your provider to perform a traditional examination.  This may limit your provider's ability to fully assess your condition.  If your provider identifies any concerns that need to be evaluated in person or the need to arrange testing such as labs, EKG, etc, we will make arrangements to do so.    Although advances in technology are sophisticated, we cannot ensure that it will always work on either your end or our end.  If the connection with a video visit is poor, we may have to switch to a telephone visit.  With either a video or telephone visit, we are not always able to ensure that we have a secure connection.   I need to obtain your verbal consent now.   Are you willing to proceed with your visit today?   Kerim A Muise has provided verbal consent on 02/26/2021 for a virtual visit (video or telephone).   Carroll Kinds, CMA 02/26/2021  2:42 PM

## 2021-02-26 NOTE — Progress Notes (Signed)
This service is provided via telemedicine  No vital signs collected/recorded due to the encounter was a telemedicine visit.   Location of patient (ex: home, work):  Home  Patient consents to a telephone visit:  Yes, see encounter dated 02/26/2021  Location of the provider (ex: office, home):  [place]  Name of any referring provider:  Alain Honey, MD  Names of all persons participating in the telemedicine service and their role in the encounter:  Sherrie Mustache, Nurse Practitioner, Carroll Kinds, CMA, and patient.   Time spent on call:  14 minutes with medical assistant

## 2021-02-26 NOTE — Patient Instructions (Addendum)
Mr. Joseph Marsh , Thank you for taking time to come for your Medicare Wellness Visit. I appreciate your ongoing commitment to your health goals. Please review the following plan we discussed and let me know if I can assist you in the future.   Screening recommendations/referrals: Colonoscopy aged out Recommended yearly ophthalmology/optometry visit for glaucoma screening and checkup Recommended yearly dental visit for hygiene and checkup  Vaccinations: Influenza vaccine RECOMMENDED at this time.  Pneumococcal vaccine up to date Tdap vaccine up to date Shingles vaccine up to date   COVID- up to date at this time.   Advanced directives: recommended to complete and place on file.   Conditions/risks identified: advance age.   Next appointment: 1 year for AWV  Preventive Care 36 Years and Older, Male Preventive care refers to lifestyle choices and visits with your health care provider that can promote health and wellness. What does preventive care include? A yearly physical exam. This is also called an annual well check. Dental exams once or twice a year. Routine eye exams. Ask your health care provider how often you should have your eyes checked. Personal lifestyle choices, including: Daily care of your teeth and gums. Regular physical activity. Eating a healthy diet. Avoiding tobacco and drug use. Limiting alcohol use. Practicing safe sex. Taking low doses of aspirin every day. Taking vitamin and mineral supplements as recommended by your health care provider. What happens during an annual well check? The services and screenings done by your health care provider during your annual well check will depend on your age, overall health, lifestyle risk factors, and family history of disease. Counseling  Your health care provider may ask you questions about your: Alcohol use. Tobacco use. Drug use. Emotional well-being. Home and relationship well-being. Sexual activity. Eating  habits. History of falls. Memory and ability to understand (cognition). Work and work Statistician. Screening  You may have the following tests or measurements: Height, weight, and BMI. Blood pressure. Lipid and cholesterol levels. These may be checked every 5 years, or more frequently if you are over 34 years old. Skin check. Lung cancer screening. You may have this screening every year starting at age 41 if you have a 30-pack-year history of smoking and currently smoke or have quit within the past 15 years. Fecal occult blood test (FOBT) of the stool. You may have this test every year starting at age 45. Flexible sigmoidoscopy or colonoscopy. You may have a sigmoidoscopy every 5 years or a colonoscopy every 10 years starting at age 66. Prostate cancer screening. Recommendations will vary depending on your family history and other risks. Hepatitis C blood test. Hepatitis B blood test. Sexually transmitted disease (STD) testing. Diabetes screening. This is done by checking your blood sugar (glucose) after you have not eaten for a while (fasting). You may have this done every 1-3 years. Abdominal aortic aneurysm (AAA) screening. You may need this if you are a current or former smoker. Osteoporosis. You may be screened starting at age 45 if you are at high risk. Talk with your health care provider about your test results, treatment options, and if necessary, the need for more tests. Vaccines  Your health care provider may recommend certain vaccines, such as: Influenza vaccine. This is recommended every year. Tetanus, diphtheria, and acellular pertussis (Tdap, Td) vaccine. You may need a Td booster every 10 years. Zoster vaccine. You may need this after age 59. Pneumococcal 13-valent conjugate (PCV13) vaccine. One dose is recommended after age 22. Pneumococcal polysaccharide (PPSV23) vaccine.  One dose is recommended after age 58. Talk to your health care provider about which screenings and  vaccines you need and how often you need them. This information is not intended to replace advice given to you by your health care provider. Make sure you discuss any questions you have with your health care provider. Document Released: 06/27/2015 Document Revised: 02/18/2016 Document Reviewed: 04/01/2015 Elsevier Interactive Patient Education  2017 [place] Prevention in the Home Falls can cause injuries. They can happen to people of all ages. There are many things you can do to make your home safe and to help prevent falls. What can I do on the outside of my home? Regularly fix the edges of walkways and driveways and fix any cracks. Remove anything that might make you trip as you walk through a door, such as a raised step or threshold. Trim any bushes or trees on the path to your home. Use bright outdoor lighting. Clear any walking paths of anything that might make someone trip, such as rocks or tools. Regularly check to see if handrails are loose or broken. Make sure that both sides of any steps have handrails. Any raised decks and porches should have guardrails on the edges. Have any leaves, snow, or ice cleared regularly. Use sand or salt on walking paths during winter. Clean up any spills in your garage right away. This includes oil or grease spills. What can I do in the bathroom? Use night lights. Install grab bars by the toilet and in the tub and shower. Do not use towel bars as grab bars. Use non-skid mats or decals in the tub or shower. If you need to sit down in the shower, use a plastic, non-slip stool. Keep the floor dry. Clean up any water that spills on the floor as soon as it happens. Remove soap buildup in the tub or shower regularly. Attach bath mats securely with double-sided non-slip rug tape. Do not have throw rugs and other things on the floor that can make you trip. What can I do in the bedroom? Use night lights. Make sure that you have a light by your  bed that is easy to reach. Do not use any sheets or blankets that are too big for your bed. They should not hang down onto the floor. Have a firm chair that has side arms. You can use this for support while you get dressed. Do not have throw rugs and other things on the floor that can make you trip. What can I do in the kitchen? Clean up any spills right away. Avoid walking on wet floors. Keep items that you use a lot in easy-to-reach places. If you need to reach something above you, use a strong step stool that has a grab bar. Keep electrical cords out of the way. Do not use floor polish or wax that makes floors slippery. If you must use wax, use non-skid floor wax. Do not have throw rugs and other things on the floor that can make you trip. What can I do with my stairs? Do not leave any items on the stairs. Make sure that there are handrails on both sides of the stairs and use them. Fix handrails that are broken or loose. Make sure that handrails are as long as the stairways. Check any carpeting to make sure that it is firmly attached to the stairs. Fix any carpet that is loose or worn. Avoid having throw rugs at the top or bottom of  the stairs. If you do have throw rugs, attach them to the floor with carpet tape. Make sure that you have a light switch at the top of the stairs and the bottom of the stairs. If you do not have them, ask someone to add them for you. What else can I do to help prevent falls? Wear shoes that: Do not have high heels. Have rubber bottoms. Are comfortable and fit you well. Are closed at the toe. Do not wear sandals. If you use a stepladder: Make sure that it is fully opened. Do not climb a closed stepladder. Make sure that both sides of the stepladder are locked into place. Ask someone to hold it for you, if possible. Clearly mark and make sure that you can see: Any grab bars or handrails. First and last steps. Where the edge of each step is. Use tools that  help you move around (mobility aids) if they are needed. These include: Canes. Walkers. Scooters. Crutches. Turn on the lights when you go into a dark area. Replace any light bulbs as soon as they burn out. Set up your furniture so you have a clear path. Avoid moving your furniture around. If any of your floors are uneven, fix them. If there are any pets around you, be aware of where they are. Review your medicines with your doctor. Some medicines can make you feel dizzy. This can increase your chance of falling. Ask your doctor what other things that you can do to help prevent falls. This information is not intended to replace advice given to you by your health care provider. Make sure you discuss any questions you have with your health care provider. Document Released: 03/27/2009 Document Revised: 11/06/2015 Document Reviewed: 07/05/2014 Elsevier Interactive Patient Education  2017 [employer].

## 2021-03-02 DIAGNOSIS — M6281 Muscle weakness (generalized): Secondary | ICD-10-CM | POA: Diagnosis not present

## 2021-03-02 DIAGNOSIS — R350 Frequency of micturition: Secondary | ICD-10-CM | POA: Diagnosis not present

## 2021-03-02 DIAGNOSIS — R3912 Poor urinary stream: Secondary | ICD-10-CM | POA: Diagnosis not present

## 2021-03-02 DIAGNOSIS — N3943 Post-void dribbling: Secondary | ICD-10-CM | POA: Diagnosis not present

## 2021-03-02 DIAGNOSIS — M62838 Other muscle spasm: Secondary | ICD-10-CM | POA: Diagnosis not present

## 2021-03-02 DIAGNOSIS — N3941 Urge incontinence: Secondary | ICD-10-CM | POA: Diagnosis not present

## 2021-03-12 DIAGNOSIS — N3941 Urge incontinence: Secondary | ICD-10-CM | POA: Diagnosis not present

## 2021-03-12 DIAGNOSIS — N3943 Post-void dribbling: Secondary | ICD-10-CM | POA: Diagnosis not present

## 2021-03-12 DIAGNOSIS — R350 Frequency of micturition: Secondary | ICD-10-CM | POA: Diagnosis not present

## 2021-03-12 DIAGNOSIS — M62838 Other muscle spasm: Secondary | ICD-10-CM | POA: Diagnosis not present

## 2021-03-12 DIAGNOSIS — M6281 Muscle weakness (generalized): Secondary | ICD-10-CM | POA: Diagnosis not present

## 2021-04-02 DIAGNOSIS — M62838 Other muscle spasm: Secondary | ICD-10-CM | POA: Diagnosis not present

## 2021-04-02 DIAGNOSIS — N3943 Post-void dribbling: Secondary | ICD-10-CM | POA: Diagnosis not present

## 2021-04-02 DIAGNOSIS — M6281 Muscle weakness (generalized): Secondary | ICD-10-CM | POA: Diagnosis not present

## 2021-04-02 DIAGNOSIS — N3941 Urge incontinence: Secondary | ICD-10-CM | POA: Diagnosis not present

## 2021-04-02 DIAGNOSIS — R350 Frequency of micturition: Secondary | ICD-10-CM | POA: Diagnosis not present

## 2021-04-03 DIAGNOSIS — Z23 Encounter for immunization: Secondary | ICD-10-CM | POA: Diagnosis not present

## 2021-04-16 DIAGNOSIS — N3943 Post-void dribbling: Secondary | ICD-10-CM | POA: Diagnosis not present

## 2021-04-16 DIAGNOSIS — M6281 Muscle weakness (generalized): Secondary | ICD-10-CM | POA: Diagnosis not present

## 2021-04-16 DIAGNOSIS — R3912 Poor urinary stream: Secondary | ICD-10-CM | POA: Diagnosis not present

## 2021-04-16 DIAGNOSIS — M62838 Other muscle spasm: Secondary | ICD-10-CM | POA: Diagnosis not present

## 2021-04-16 DIAGNOSIS — R350 Frequency of micturition: Secondary | ICD-10-CM | POA: Diagnosis not present

## 2021-04-16 DIAGNOSIS — N3941 Urge incontinence: Secondary | ICD-10-CM | POA: Diagnosis not present

## 2021-05-04 DIAGNOSIS — R350 Frequency of micturition: Secondary | ICD-10-CM | POA: Diagnosis not present

## 2021-05-04 DIAGNOSIS — M6281 Muscle weakness (generalized): Secondary | ICD-10-CM | POA: Diagnosis not present

## 2021-05-04 DIAGNOSIS — N3941 Urge incontinence: Secondary | ICD-10-CM | POA: Diagnosis not present

## 2021-05-04 DIAGNOSIS — M62838 Other muscle spasm: Secondary | ICD-10-CM | POA: Diagnosis not present

## 2021-05-04 DIAGNOSIS — N3943 Post-void dribbling: Secondary | ICD-10-CM | POA: Diagnosis not present

## 2021-06-02 DIAGNOSIS — M62838 Other muscle spasm: Secondary | ICD-10-CM | POA: Diagnosis not present

## 2021-06-02 DIAGNOSIS — N3941 Urge incontinence: Secondary | ICD-10-CM | POA: Diagnosis not present

## 2021-06-02 DIAGNOSIS — M6281 Muscle weakness (generalized): Secondary | ICD-10-CM | POA: Diagnosis not present

## 2021-06-02 DIAGNOSIS — N3943 Post-void dribbling: Secondary | ICD-10-CM | POA: Diagnosis not present

## 2021-06-02 DIAGNOSIS — R350 Frequency of micturition: Secondary | ICD-10-CM | POA: Diagnosis not present

## 2021-08-20 DIAGNOSIS — H15833 Staphyloma posticum, bilateral: Secondary | ICD-10-CM | POA: Diagnosis not present

## 2021-08-20 DIAGNOSIS — H04123 Dry eye syndrome of bilateral lacrimal glands: Secondary | ICD-10-CM | POA: Diagnosis not present

## 2021-08-20 DIAGNOSIS — H25813 Combined forms of age-related cataract, bilateral: Secondary | ICD-10-CM | POA: Diagnosis not present

## 2021-08-20 DIAGNOSIS — H40013 Open angle with borderline findings, low risk, bilateral: Secondary | ICD-10-CM | POA: Diagnosis not present

## 2021-08-21 ENCOUNTER — Ambulatory Visit (INDEPENDENT_AMBULATORY_CARE_PROVIDER_SITE_OTHER): Payer: Medicare Other | Admitting: Gastroenterology

## 2021-08-21 ENCOUNTER — Encounter: Payer: Self-pay | Admitting: Gastroenterology

## 2021-08-21 VITALS — BP 126/84 | HR 67 | Ht 70.0 in | Wt 171.0 lb

## 2021-08-21 DIAGNOSIS — R194 Change in bowel habit: Secondary | ICD-10-CM

## 2021-08-21 MED ORDER — NA SULFATE-K SULFATE-MG SULF 17.5-3.13-1.6 GM/177ML PO SOLN: 1.0000 | Freq: Once | ORAL | 0 refills | Status: AC

## 2021-08-21 NOTE — Patient Instructions (Addendum)
Start Benefiber 2 teaspoons in 8 ounces of liquid daily. ? ?You have been scheduled for a colonoscopy. Please follow written instructions given to you at your visit today.  ?Please pick up your prep supplies at the pharmacy within the next 1-3 days. ?If you use inhalers (even only as needed), please bring them with you on the day of your procedure. ? ?If you are age 78 or older, your body mass index should be between 23-30. Your Body mass index is 24.54 kg/m?Marland Kitchen If this is out of the aforementioned range listed, please consider follow up with your Primary Care Provider. ?________________________________________________________ ? ?The [facility] providers would like to encourage you to use [facility] to communicate with providers for non-urgent requests or questions.  Due to long hold times on the telephone, sending your provider a message by [facility] may be a faster and more efficient way to get a response.  Please allow 48 business hours for a response.  Please remember that this is for non-urgent requests.  ?_______________________________________________________ ? ?

## 2021-08-21 NOTE — Progress Notes (Signed)
? ? ? ?08/21/2021 ?Joseph Marsh ?[id number] ?10-11-1945 ? ? ?HISTORY OF PRESENT ILLNESS: This is a pleasant 76 year old male who is previously a patient of Dr. Buel Ream.  He has not been seen here since his last colonoscopy in November 2013.  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  He used some Canasa suppositories for that for a short time and has not had any major bleeding issues.  Anyway, he tells me that he received a colonoscopy recall letter for November 2020, but with that being in the middle of the pandemic he decided to postpone it.  He is here now to discuss colonoscopy.  He tells me that he has very minimal amounts of bleeding on occasion.  He says that he moves his bowels quite regularly, but all of a sudden 2 weeks ago he had sudden onset of change where he is having difficulty completely emptying his bowels.  He said that 1 day it was his normal/regular and then the next day it was changed and has persisted since then.  He says that it is not hard stool, it is just mushier, difficult to clean, and feels like his rectum does not completely empty. ? ?He does have history of prostate cancer with radiation.  Has experienced issues with urinary incontinence over the past year or so as well. ? ? ?Past Medical History:  ?Diagnosis Date  ? Basal cell carcinoma (BCC)   ? BPH (benign prostatic hyperplasia)   ? Cellulitis   ? Eczema   ? Hyperlipidemia   ? Kidney stone   ? Plantar fasciitis   ? Prostate cancer ([place])   ? Right shoulder pain   ? Sinusitis   ? Spinal stenosis   ? Squamous cell carcinoma in situ   ? ?Past Surgical History:  ?Procedure Laterality Date  ? CHOLECYSTECTOMY N/A 05/24/2013  ? Procedure: LAPAROSCOPIC CHOLECYSTECTOMY;  Surgeon: Edward Jolly, MD;  Location: [facility];  Service: General;  Laterality: N/A;  ? COLONOSCOPY  1996  ? polp removal  ? KNEE SURGERY    ? LITHOTRIPSY    ? MRI    ? sinus cauterization  1970  ? TONSILLECTOMY     ? ? reports that he has never smoked. He has never used smokeless tobacco. He reports that he does not drink alcohol and does not use drugs. ?family history includes Alcoholism in his son; Breast cancer in his sister; Depression in his son; Diverticulitis in his sister and sister; Heart disease in his father; Melanoma in his brother; Osteoporosis in his sister; Other in his mother; Pneumonia in his nephew; Thyroid disease in his sister. ?No Known Allergies ? ?  ?Outpatient Encounter Medications as of 08/21/2021  ?Medication Sig  ? acetaminophen (TYLENOL) 500 MG tablet Take 500 mg by mouth as needed.  ? Ascorbic Acid (VITAMIN C) 1000 MG tablet Take 1,000 mg by mouth daily.   ? BIOTIN PO Take 1,000 mcg by mouth daily.   ? Cholecalciferol (VITAMIN D-3 PO) Take 1,000 mg by mouth daily.   ? Cyanocobalamin (VITAMIN B 12 PO) Take by mouth.  ? finasteride (PROSCAR) 5 MG tablet Take 5 mg by mouth daily.  ? fluocinonide-emollient (LIDEX-E) 0.05 % cream Apply 1 application topically 2 (two) times daily.  ? Glucos-Chondroit-Collag-Hyal (GLUCOSAMINE CHONDROIT-COLLAGEN PO) Take 1 capsule by mouth 2 (two) times daily.   ? MELATONIN PO Take 8 mg by mouth at bedtime.  ? Multiple Vitamin (MULTIVITAMIN) tablet Take 1 tablet  by mouth daily.  ? tamsulosin (FLOMAX) 0.4 MG CAPS capsule Take 0.2 mg by mouth daily. Prescribed by urologist  ? Turmeric Curcumin 500 MG CAPS Take by mouth daily.  ? [DISCONTINUED] hydrocortisone 2.5 % lotion Apply topically 2 (two) times daily. (Patient not taking: Reported on 08/21/2021)  ? [DISCONTINUED] neomycin-bacitracin-polymyxin (NEOSPORIN) ointment Apply 1 application topically as needed for wound care. (Patient not taking: Reported on 08/21/2021)  ? ?No facility-administered encounter medications on file as of 08/21/2021.  ? ? ? ?REVIEW OF SYSTEMS  : All other systems reviewed and negative except where noted in the History of Present Illness. ? ? ?PHYSICAL EXAM: ?BP 126/84   Pulse 67   Ht '5\' 10"'$  (1.778  m)   Wt 171 lb (77.6 kg)   SpO2 96%   BMI 24.54 kg/m?  ?General: Well developed white male in no acute distress ?Head: Normocephalic and atraumatic ?Eyes:  Sclerae anicteric, conjunctiva pink. ?Ears: Normal auditory acuity ?Lungs: Clear throughout to auscultation; no W/R/R. ?Heart: Regular rate and rhythm; no M/R/G. ?Abdomen: Soft, non-distended.  BS present.  Non-tender. ?Rectal:  Will be done at the time of colonoscopy. ?Musculoskeletal: Symmetrical with no gross deformities  ?Skin: No lesions on visible extremities ?Extremities: No edema  ?Neurological: Alert oriented x 4, grossly non-focal ?Psychological:  Alert and cooperative. Normal mood and affect ? ?ASSESSMENT AND PLAN: ?*Change in bowel habits with incomplete emptying of stool: This has been an issue for just the past couple weeks, changed suddenly within a days time and has been persistent since then.  Last colonoscopy November 2013.  Does have history of prostate cancer for which he received radiation therapy.  Has had urinary incontinence issues with that over the past year.  Encouraged that he began using Benefiber 2 teaspoons mixed in 8 ounces of liquid daily.  We will plan for colonoscopy Dr. Havery Moros.  The risks, benefits, and alternatives to colonoscopy were discussed with the patient and he consents to proceed.  ? ?CC:  Wardell Honour, MD ? ?  ?

## 2021-08-21 NOTE — Progress Notes (Signed)
Agree with assessment and plan as outlined.  

## 2021-09-10 ENCOUNTER — Ambulatory Visit (AMBULATORY_SURGERY_CENTER): Payer: Medicare Other | Admitting: Gastroenterology

## 2021-09-10 ENCOUNTER — Encounter: Payer: Self-pay | Admitting: Gastroenterology

## 2021-09-10 VITALS — BP 118/64 | HR 49 | Temp 98.0°F | Resp 12 | Ht 70.0 in | Wt 171.0 lb

## 2021-09-10 DIAGNOSIS — R194 Change in bowel habit: Secondary | ICD-10-CM | POA: Diagnosis not present

## 2021-09-10 DIAGNOSIS — D125 Benign neoplasm of sigmoid colon: Secondary | ICD-10-CM | POA: Diagnosis not present

## 2021-09-10 DIAGNOSIS — D120 Benign neoplasm of cecum: Secondary | ICD-10-CM

## 2021-09-10 DIAGNOSIS — D122 Benign neoplasm of ascending colon: Secondary | ICD-10-CM | POA: Diagnosis not present

## 2021-09-10 DIAGNOSIS — D123 Benign neoplasm of transverse colon: Secondary | ICD-10-CM | POA: Diagnosis not present

## 2021-09-10 MED ORDER — SODIUM CHLORIDE 0.9 % IV SOLN: 500.0000 mL | Freq: Once | INTRAVENOUS | Status: DC

## 2021-09-10 NOTE — Progress Notes (Signed)
Pt's states no medical or surgical changes since previsit or office visit. 

## 2021-09-10 NOTE — Progress Notes (Signed)
Called to room to assist during endoscopic procedure.  Patient ID and intended procedure confirmed with present staff. Received instructions for my participation in the procedure from the performing physician.  

## 2021-09-10 NOTE — Progress Notes (Signed)
History and Physical Interval Note: See in clinic on 08/21/21 - no interval changes in his health. Altered bowel habits at time of visit which appears to have improved since I have seen him. Last colonoscopy 2013. Discussed risks /benefits he wishes to proceed. ? ? ?09/10/2021 ?9:25 AM ? ?Joseph Marsh  has presented today for endoscopic procedure(s), with the diagnosis of  ?Encounter Diagnosis  ?Name Primary?  ? Change in bowel habits Yes  ?Marland Kitchen  The various methods of evaluation and treatment have been discussed with the patient and/or family. After consideration of risks, benefits and other options for treatment, the patient has consented to  the endoscopic procedure(s). ? ? The patient's history has been reviewed, patient examined, no change in status, stable for surgery.  I have reviewed the patient's chart and labs.  Questions were answered to the patient's satisfaction.   ? ?Jolly Mango, MD ?[facility] ? ?

## 2021-09-10 NOTE — Patient Instructions (Signed)
Resume previous diet and medications. Awaiting pathology results. ? ?YOU HAD AN ENDOSCOPIC PROCEDURE TODAY AT THE [facility]:   Refer to the procedure report that was given to you for any specific questions about what was found during the examination.  If the procedure report does not answer your questions, please call your gastroenterologist to clarify.  If you requested that your care partner not be given the details of your procedure findings, then the procedure report has been included in a sealed envelope for you to review at your convenience later. ? ?YOU SHOULD EXPECT: Some feelings of bloating in the abdomen. Passage of more gas than usual.  Walking can help get rid of the air that was put into your GI tract during the procedure and reduce the bloating. If you had a lower endoscopy (such as a colonoscopy or flexible sigmoidoscopy) you may notice spotting of blood in your stool or on the toilet paper. If you underwent a bowel prep for your procedure, you may not have a normal bowel movement for a few days. ? ?Please Note:  You might notice some irritation and congestion in your nose or some drainage.  This is from the oxygen used during your procedure.  There is no need for concern and it should clear up in a day or so. ? ?SYMPTOMS TO REPORT IMMEDIATELY: ? ?Following lower endoscopy (colonoscopy or flexible sigmoidoscopy): ? Excessive amounts of blood in the stool ? Significant tenderness or worsening of abdominal pains ? Swelling of the abdomen that is new, acute ? Fever of 100?F or higher ? ?For urgent or emergent issues, a gastroenterologist can be reached at any hour by calling [phone]. ?Do not use MyChart messaging for urgent concerns.  ? ? ?DIET:  We do recommend a small meal at first, but then you may proceed to your regular diet.  Drink plenty of fluids but you should avoid alcoholic beverages for 24 hours. ? ?ACTIVITY:  You should plan to take it easy for the rest of today and  you should NOT DRIVE or use heavy machinery until tomorrow (because of the sedation medicines used during the test).   ? ?FOLLOW UP: ?Our staff will call the number listed on your records 48-72 hours following your procedure to check on you and address any questions or concerns that you may have regarding the information given to you following your procedure. If we do not reach you, we will leave a message.  We will attempt to reach you two times.  During this call, we will ask if you have developed any symptoms of COVID 19. If you develop any symptoms (ie: fever, flu-like symptoms, shortness of breath, cough etc.) before then, please call [phone].  If you test positive for Covid 19 in the 2 weeks post procedure, please call and report this information to us.   ? ?If any biopsies were taken you will be contacted by phone or by letter within the next 1-3 weeks.  Please call us at [phone] if you have not heard about the biopsies in 3 weeks.  ? ? ?SIGNATURES/CONFIDENTIALITY: ?You and/or your care partner have signed paperwork which will be entered into your electronic medical record.  These signatures attest to the fact that that the information above on your After Visit Summary has been reviewed and is understood.  Full responsibility of the confidentiality of this discharge information lies with you and/or your care-partner.  ?

## 2021-09-10 NOTE — Op Note (Signed)
[place] ?Patient Name: Joseph Marsh ?Procedure Date: 09/10/2021 9:14 AM ?MRN: [id number] ?Endoscopist: Carlota Raspberry. Havery Moros , MD ?Age: 76 ?Referring MD:  ?Date of Birth: 1945-08-07 ?Gender: Male ?Account #: [id number] ?Procedure:                Colonoscopy ?Indications:              Change in bowel habits, history of prostate cancer  ?                          s/p XRT, last colonoscopy 2013 - since clinic visit  ?                          bowel habits have intervally returned to normal.  ?                          Denies bleeding. ?Medicines:                Monitored Anesthesia Care ?Procedure:                Pre-Anesthesia Assessment: ?                          - Prior to the procedure, a History and Physical  ?                          was performed, and patient medications and  ?                          allergies were reviewed. The patient's tolerance of  ?                          previous anesthesia was also reviewed. The risks  ?                          and benefits of the procedure and the sedation  ?                          options and risks were discussed with the patient.  ?                          All questions were answered, and informed consent  ?                          was obtained. Prior Anticoagulants: The patient has  ?                          taken no previous anticoagulant or antiplatelet  ?                          agents. ASA Grade Assessment: II - A patient with  ?                          mild systemic disease. After reviewing the risks  ?  and benefits, the patient was deemed in  ?                          satisfactory condition to undergo the procedure. ?                          After obtaining informed consent, the colonoscope  ?                          was passed under direct vision. Throughout the  ?                          procedure, the patient's blood pressure, pulse, and  ?                          oxygen saturations were monitored  continuously. The  ?                          Olympus [id number]-[id number] (#[id number]) Colonoscope was  ?                          introduced through the anus and advanced to the the  ?                          cecum, identified by appendiceal orifice and  ?                          ileocecal valve. The colonoscopy was performed  ?                          without difficulty. The patient tolerated the  ?                          procedure well. The quality of the bowel  ?                          preparation was adequate. The ileocecal valve,  ?                          appendiceal orifice, and rectum were photographed. ?Scope In: 9:30:23 AM ?Scope Out: 10:15:45 AM ?Scope Withdrawal Time: 0 hours 35 minutes 47 seconds  ?Total Procedure Duration: 0 hours 45 minutes 22 seconds  ?Findings:                 The perianal and digital rectal examinations were  ?                          normal. ?                          Three sessile polyps were found in the cecum. The  ?                          polyps were 2 to 3 mm in size. These polyps were  ?  removed with a cold snare. Resection and retrieval  ?                          were complete. ?                          A 3 mm polyp was found in the ileocecal valve. The  ?                          polyp was flat. The polyp was removed with a cold  ?                          snare. Resection and retrieval were complete. ?                          Three sessile polyps were found in the ascending  ?                          colon. The polyps were 3 to 5 mm in size. These  ?                          polyps were removed with a cold snare. Resection  ?                          and retrieval were complete. ?                          Three pedunculated and sessile polyps were found in  ?                          the transverse colon. The polyps were 3 to 5 mm in  ?                          size. These polyps were removed with a cold snare.  ?                           Resection and retrieval were complete. ?                          A 3 mm polyp was found in the sigmoid colon. The  ?                          polyp was sessile. The polyp was removed with a  ?                          cold snare. Resection and retrieval were complete. ?                          Multiple small-mouthed diverticula were found in  ?                          the entire colon. ?  Multiple small angiodysplastic lesions without  ?                          bleeding were found in the rectum consistent with  ?                          mild radiation proctitis. ?                          Internal hemorrhoids were found during retroflexion. ?                          There was spasm in the entire colon which prolonged  ?                          the exam. ?                          The exam was otherwise without abnormality. Prep  ?                          was adequate enough but several minutes spent  ?                          lavaging the colon to achieve adequate views. ?Complications:            No immediate complications. Estimated blood loss:  ?                          Minimal. ?Estimated Blood Loss:     Estimated blood loss was minimal. ?Impression:               - Three 2 to 3 mm polyps in the cecum, removed with  ?                          a cold snare. Resected and retrieved. ?                          - One 3 mm polyp at the ileocecal valve, removed  ?                          with a cold snare. Resected and retrieved. ?                          - Three 3 to 5 mm polyps in the ascending colon,  ?                          removed with a cold snare. Resected and retrieved. ?                          - Three 3 to 5 mm polyps in the transverse colon,  ?                          removed with a cold snare. Resected and retrieved. ?                          -  One 3 mm polyp in the sigmoid colon, removed with  ?                          a cold snare. Resected and retrieved. ?                           - Diverticulosis in the entire examined colon. ?                          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 ?                          - Internal hemorrhoids. ?                          - Colonic spasm. ?                          - The examination was otherwise normal. ?                          No significant abnormalities. Mild radiation  ?                          proctitis could cause symptoms intermittently. ?Recommendation:           - Patient has a contact number available for  ?                          emergencies. The signs and symptoms of potential  ?                          delayed complications were discussed with the  ?                          patient. Return to normal activities tomorrow.  ?                          Written discharge instructions were provided to the  ?                          patient. ?                          - Resume previous diet. ?                          - Continue present medications. ?                          - Await pathology results. ?                          - Treatment for radiation proctitis if symptoms  ?  recur / ppersistent ?Remo Lipps P. Elbridge Magowan, MD ?09/10/2021 10:25:16 AM ?This report has been signed electronically. ?

## 2021-09-10 NOTE — Progress Notes (Signed)
Pt non-responsive, VVS, Report to RN  °

## 2021-09-14 ENCOUNTER — Telehealth: Payer: Self-pay | Admitting: *Deleted

## 2021-09-14 NOTE — Telephone Encounter (Signed)
?  Follow up Call- ? ? ?  09/10/2021  ?  8:43 AM  ?Call back number  ?Post procedure Call Back phone  # [phone]  ?Permission to leave phone message Yes  ?  ? ?Patient questions: ? ?Do you have a fever, pain , or abdominal swelling? No. ?Pain Score  0 * ? ?Have you tolerated food without any problems?yes ? ?Have you been able to return to your normal activities? yes ? ?Do you have any questions about your discharge instructions: ?Diet   No. ?Medications  No. ?Follow up visit  No. ? ?Do you have questions or concerns about your Care? No. ? ?Actions: ?* If pain score is 4 or above: ?No action needed, pain <4. ? ? ?

## 2021-10-29 DIAGNOSIS — R338 Other retention of urine: Secondary | ICD-10-CM | POA: Diagnosis not present

## 2021-10-29 DIAGNOSIS — N35011 Post-traumatic bulbous urethral stricture: Secondary | ICD-10-CM | POA: Diagnosis not present

## 2021-11-05 DIAGNOSIS — N35011 Post-traumatic bulbous urethral stricture: Secondary | ICD-10-CM | POA: Diagnosis not present

## 2021-11-05 DIAGNOSIS — R338 Other retention of urine: Secondary | ICD-10-CM | POA: Diagnosis not present

## 2021-12-28 DIAGNOSIS — Z85828 Personal history of other malignant neoplasm of skin: Secondary | ICD-10-CM | POA: Diagnosis not present

## 2021-12-28 DIAGNOSIS — L821 Other seborrheic keratosis: Secondary | ICD-10-CM | POA: Diagnosis not present

## 2021-12-28 DIAGNOSIS — L239 Allergic contact dermatitis, unspecified cause: Secondary | ICD-10-CM | POA: Diagnosis not present

## 2021-12-28 DIAGNOSIS — D225 Melanocytic nevi of trunk: Secondary | ICD-10-CM | POA: Diagnosis not present

## 2021-12-28 DIAGNOSIS — D224 Melanocytic nevi of scalp and neck: Secondary | ICD-10-CM | POA: Diagnosis not present

## 2022-01-13 ENCOUNTER — Telehealth: Payer: Self-pay

## 2022-01-13 NOTE — Telephone Encounter (Signed)
Message left on clinical intake voicemail:   " I have an appointment with Dr.Miller on 01/19/22 and I do not see that I have an appointment for labs prior. I need to know if I am suppose to have labs. I will not be able to fast, until my 3 am appointment. Please call me back to discuss."  Call returned to patient and I left a detailed message informing patient that Dr.Miller does not have his patients get labs prior to appointments, he will check them at the time of appointment. Dr.Miller has also shared that information in up-to-date states patient do not have to fast for labs.   I instructed Joseph Marsh to return call or send a mychart message if he has additional questions or concerns, otherwise we will see him on 01/19/22

## 2022-01-19 ENCOUNTER — Encounter: Payer: Self-pay | Admitting: Family Medicine

## 2022-01-19 ENCOUNTER — Ambulatory Visit (INDEPENDENT_AMBULATORY_CARE_PROVIDER_SITE_OTHER): Payer: Medicare Other | Admitting: Family Medicine

## 2022-01-19 VITALS — BP 132/82 | HR 60 | Temp 97.5°F | Ht 70.0 in | Wt 166.4 lb

## 2022-01-19 DIAGNOSIS — M48061 Spinal stenosis, lumbar region without neurogenic claudication: Secondary | ICD-10-CM | POA: Diagnosis not present

## 2022-01-19 DIAGNOSIS — R739 Hyperglycemia, unspecified: Secondary | ICD-10-CM

## 2022-01-19 DIAGNOSIS — E7800 Pure hypercholesterolemia, unspecified: Secondary | ICD-10-CM | POA: Diagnosis not present

## 2022-01-20 LAB — HEMOGLOBIN A1C
Hgb A1c MFr Bld: 5.9 % of total Hgb — ABNORMAL HIGH (ref <5.7)
Mean Plasma Glucose: 123 mg/dL
eAG (mmol/L): 6.8 mmol/L

## 2022-01-20 LAB — LIPID PANEL
Cholesterol: 150 mg/dL (ref <200)
HDL: 39 mg/dL — ABNORMAL LOW (ref >=40)
LDL Cholesterol (Calc): 84 mg/dL (calc)
Non-HDL Cholesterol (Calc): 111 mg/dL (calc) (ref <130)
Total CHOL/HDL Ratio: 3.8 (calc) (ref <5.0)
Triglycerides: 174 mg/dL — ABNORMAL HIGH (ref <150)

## 2022-01-20 NOTE — Progress Notes (Signed)
Provider:  Alain Honey, MD  Careteam: Patient Care Team: Wardell Honour, MD as PCP - General (Family Medicine) Gaynelle Arabian, MD as Consulting Physician (Orthopedic Surgery) Jarome Matin, MD as Consulting Physician (Dermatology) Raynelle Bring, MD as Consulting Physician (Urology)  PLACE OF SERVICE:  [facility] Direct[facility] information    No Known Allergies  Chief Complaint  Patient presents with   Medical Management of Chronic Issues    Patient presents today for a physical and 1 year follow-up.     HPI: Patient is a 76 y.o. male patient is here for 1 year follow-up. We spent much of the time discussing his protracted travails with his urinary system.  He has had incontinence frequency poor urinary stream retention.  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  He also had radiation around the time of prostatectomy.  All seems to have been relieved now. He is concerned about his heart.  He has no symptoms suggestive of ischemic disease.  There is no risk factor. He does also mention presently that he has a new relationship with a 43 year old woman.  With that relation ship he has need of sildenafil.  He does also continue to take Flomax and Proscar for prostate.  Review of Systems:  Review of Systems  Constitutional: Negative.   HENT: Negative.    Eyes: Negative.   Respiratory: Negative.    Cardiovascular: Negative.   Genitourinary:  Positive for frequency and urgency.  Neurological: Negative.   Psychiatric/Behavioral: Negative.    All other systems reviewed and are negative.   Past Medical History:  Diagnosis Date   Basal cell carcinoma (BCC)    BPH (benign prostatic hyperplasia)    Cellulitis    Eczema    Hyperlipidemia    Kidney stone    Plantar fasciitis    Prostate cancer ([place])    Right shoulder pain    Sinusitis    Spinal stenosis    Squamous  cell carcinoma in situ    Past Surgical History:  Procedure Laterality Date   CHOLECYSTECTOMY N/A 05/24/2013   Procedure: LAPAROSCOPIC CHOLECYSTECTOMY;  Surgeon: Edward Jolly, MD;  Location: [facility];  Service: General;  Laterality: N/A;   COLONOSCOPY  1996   polp removal   KNEE SURGERY     LITHOTRIPSY     MRI     sinus cauterization  1970   TONSILLECTOMY     Social History:   reports that he has never smoked. He has never used smokeless tobacco. He reports that he does not drink alcohol and does not use drugs.  Family History  Problem Relation Age of Onset   Other Mother        Organ Failure, no specified per [facility] new patient packet    Heart disease Father        died from. Cerebral Hemorrage, smoker and ETOH    Osteoporosis Sister    Diverticulitis Sister    Thyroid disease Sister    Breast cancer Sister    Diverticulitis Sister    Melanoma Brother    Depression Son    Alcoholism Son    Pneumonia Nephew    Colon cancer Neg Hx    Esophageal cancer Neg Hx    Stomach cancer Neg Hx    Pancreatic cancer Neg Hx     Medications: Patient's Medications  New Prescriptions   No medications on file  Previous Medications   ACETAMINOPHEN (TYLENOL) 500 MG TABLET    Take 500 mg by mouth as needed.   ASCORBIC ACID (VITAMIN C) 1000 MG TABLET    Take 1,000 mg by mouth daily.    BIOTIN PO    Take 1,000 mcg by mouth daily.    CHOLECALCIFEROL (VITAMIN D-3 PO)    Take 1,000 mg by mouth daily.    COENZYME Q10 (CO Q 10) 100 MG CAPS    Take 100 mg by mouth daily.   CYANOCOBALAMIN (VITAMIN B 12 PO)    Take by mouth.   FINASTERIDE (PROSCAR) 5 MG TABLET    Take 5 mg by mouth daily.   FLUOCINONIDE-EMOLLIENT (LIDEX-E) 0.05 % CREAM    Apply 1 application topically 2 (two) times daily.   GLUCOS-CHONDROIT-COLLAG-HYAL (GLUCOSAMINE CHONDROIT-COLLAGEN PO)    Take 1 capsule by mouth 2 (two) times daily.    MELATONIN PO    Take 10 mg by mouth at bedtime.   MULTIPLE VITAMIN (MULTIVITAMIN) TABLET     Take 1 tablet by mouth daily.   TAMSULOSIN (FLOMAX) 0.4 MG CAPS CAPSULE    Take 0.4 mg by mouth in the morning and at bedtime. Prescribed by urologist   TURMERIC CURCUMIN 500 MG CAPS    Take by mouth daily.  Modified Medications   No medications on file  Discontinued Medications   DOXYCYCLINE (VIBRA-TABS) 100 MG TABLET    Take 100 mg by mouth 2 (two) times daily.    Physical Exam:  Vitals:   01/19/22 1451  BP: 132/82  Pulse: 60  Temp: (!) 97.5 F (36.4 C)  SpO2: 98%  Weight: 166 lb 6.4 oz (75.5 kg)  Height: '5\' 10"'$  (1.778 m)   Body mass index is 23.88 kg/m. Wt Readings from Last 3 Encounters:  01/19/22 166 lb 6.4 oz (75.5 kg)  09/10/21 171 lb (77.6 kg)  08/21/21 171 lb (77.6 kg)    Physical Exam Vitals and nursing note reviewed.  Constitutional:      Appearance: Normal appearance.  HENT:     Right Ear: Tympanic membrane normal.     Left Ear: Tympanic membrane normal.  Eyes:     Extraocular Movements: Extraocular movements intact.     Pupils: Pupils are equal, round, and reactive to light.  Cardiovascular:     Pulses: Normal pulses.     Heart sounds: Normal heart sounds.  Pulmonary:     Effort: Pulmonary effort is normal.     Breath sounds: Normal breath sounds.  Abdominal:     General: Abdomen is flat. Bowel sounds are normal.     Palpations: Abdomen is soft.  Neurological:     General: No focal deficit present.     Mental Status: He is alert and oriented to person, place, and time.  Psychiatric:        Mood and Affect: Mood normal.        Behavior: Behavior normal.     Labs reviewed: Basic Metabolic Panel: No results for input(s): "NA", "K", "CL", "CO2", "GLUCOSE", "BUN", "CREATININE", "CALCIUM", "MG", "PHOS", "TSH" in the last 8760 hours. Liver Function Tests: No results for input(s): "AST", "ALT", "ALKPHOS", "BILITOT", "PROT", "ALBUMIN" in the last 8760 hours. No results for input(s): "LIPASE", "AMYLASE" in the last 8760 hours. No results for  input(s): "AMMONIA" in the last 8760 hours. CBC: No results for input(s): "WBC", "NEUTROABS", "HGB", "HCT", "MCV", "PLT" in the last 8760 hours. Lipid Panel: Recent Labs    01/19/22 1616  CHOL 150  HDL 39*  LDLCALC 84  TRIG 174*  CHOLHDL 3.8   TSH: No results for input(s): "TSH" in the last 8760 hours. A1C: Lab Results  Component Value Date   HGBA1C 5.9 (H) 01/19/2022     Assessment/Plan  1. Hyperglycemia A1c was elevated 1 year ago at 6.2.  He watches his diet but does admit to liking carbohydrate  2. Pure hypercholesterolemia Lipids show elevation of triglycerides.  We discussed its effect on total cholesterol and healthy diet is likely related  3. Spinal stenosis of lumbar region without neurogenic claudication Asymptomatic at present   Alain Honey, MD [place] [phone]

## 2022-01-21 ENCOUNTER — Other Ambulatory Visit: Payer: Self-pay | Admitting: Urology

## 2022-01-21 ENCOUNTER — Other Ambulatory Visit (HOSPITAL_COMMUNITY): Payer: Self-pay | Admitting: Urology

## 2022-01-21 DIAGNOSIS — D49512 Neoplasm of unspecified behavior of left kidney: Secondary | ICD-10-CM

## 2022-02-16 ENCOUNTER — Ambulatory Visit (HOSPITAL_COMMUNITY)
Admission: RE | Admit: 2022-02-16 | Discharge: 2022-02-16 | Disposition: A | Discharge status: Home / Self Care | Payer: Medicare Other | Source: Ambulatory Visit | Attending: Urology | Admitting: Urology

## 2022-02-16 DIAGNOSIS — N2889 Other specified disorders of kidney and ureter: Secondary | ICD-10-CM | POA: Diagnosis not present

## 2022-02-16 DIAGNOSIS — D49512 Neoplasm of unspecified behavior of left kidney: Secondary | ICD-10-CM | POA: Insufficient documentation

## 2022-02-16 DIAGNOSIS — I700 Atherosclerosis of aorta: Secondary | ICD-10-CM | POA: Diagnosis not present

## 2022-02-16 DIAGNOSIS — Z9049 Acquired absence of other specified parts of digestive tract: Secondary | ICD-10-CM | POA: Diagnosis not present

## 2022-02-16 DIAGNOSIS — K862 Cyst of pancreas: Secondary | ICD-10-CM | POA: Diagnosis not present

## 2022-02-16 MED ORDER — GADOBUTROL 1 MMOL/ML IV SOLN
7.5000 mL | Freq: Once | INTRAVENOUS | Status: AC | PRN
  Administered 2022-02-16: 7.5 mL via INTRAVENOUS

## 2022-02-18 ENCOUNTER — Encounter: Payer: Self-pay | Admitting: Family Medicine

## 2022-02-22 DIAGNOSIS — Z8546 Personal history of malignant neoplasm of prostate: Secondary | ICD-10-CM | POA: Diagnosis not present

## 2022-02-25 ENCOUNTER — Other Ambulatory Visit: Payer: Self-pay | Admitting: Family Medicine

## 2022-02-25 DIAGNOSIS — H15833 Staphyloma posticum, bilateral: Secondary | ICD-10-CM | POA: Diagnosis not present

## 2022-02-25 DIAGNOSIS — R9389 Abnormal findings on diagnostic imaging of other specified body structures: Secondary | ICD-10-CM

## 2022-02-25 DIAGNOSIS — H04123 Dry eye syndrome of bilateral lacrimal glands: Secondary | ICD-10-CM | POA: Diagnosis not present

## 2022-02-25 DIAGNOSIS — H40013 Open angle with borderline findings, low risk, bilateral: Secondary | ICD-10-CM | POA: Diagnosis not present

## 2022-02-25 DIAGNOSIS — H25813 Combined forms of age-related cataract, bilateral: Secondary | ICD-10-CM | POA: Diagnosis not present

## 2022-03-03 DIAGNOSIS — D49512 Neoplasm of unspecified behavior of left kidney: Secondary | ICD-10-CM | POA: Diagnosis not present

## 2022-03-03 DIAGNOSIS — N35011 Post-traumatic bulbous urethral stricture: Secondary | ICD-10-CM | POA: Diagnosis not present

## 2022-03-03 DIAGNOSIS — N401 Enlarged prostate with lower urinary tract symptoms: Secondary | ICD-10-CM | POA: Diagnosis not present

## 2022-03-03 DIAGNOSIS — R3912 Poor urinary stream: Secondary | ICD-10-CM | POA: Diagnosis not present

## 2022-03-04 ENCOUNTER — Ambulatory Visit
Admission: RE | Admit: 2022-03-04 | Discharge: 2022-03-04 | Disposition: A | Discharge status: Home / Self Care | Payer: Medicare Other | Source: Ambulatory Visit | Attending: Family Medicine | Admitting: Family Medicine

## 2022-03-04 ENCOUNTER — Encounter: Payer: Self-pay | Admitting: Nurse Practitioner

## 2022-03-04 ENCOUNTER — Ambulatory Visit (INDEPENDENT_AMBULATORY_CARE_PROVIDER_SITE_OTHER): Payer: Medicare Other | Admitting: Nurse Practitioner

## 2022-03-04 ENCOUNTER — Telehealth: Payer: Self-pay

## 2022-03-04 DIAGNOSIS — Z0000 Encounter for general adult medical examination without abnormal findings: Secondary | ICD-10-CM

## 2022-03-04 DIAGNOSIS — R9389 Abnormal findings on diagnostic imaging of other specified body structures: Secondary | ICD-10-CM

## 2022-03-04 MED ORDER — IOPAMIDOL (ISOVUE-300) INJECTION 61%
75.0000 mL | Freq: Once | INTRAVENOUS | Status: AC | PRN
  Administered 2022-03-04: 75 mL via INTRAVENOUS

## 2022-03-04 NOTE — Progress Notes (Signed)
This service is provided via telemedicine  No vital signs collected/recorded due to the encounter was a telemedicine visit.   Location of patient (ex: home, work):  Home  Patient consents to a telephone visit:  Yes, see encounter dated 03/04/2022  Location of the provider (ex: office, home):  [place]  Name of any referring provider:  Alain Honey, MD  Names of all persons participating in the telemedicine service and their role in the encounter:  Sherrie Mustache, Nurse Practitioner, Carroll Kinds, CMA, and patient.   Time spent on call:  15 minutes with medical assistant

## 2022-03-04 NOTE — Patient Instructions (Signed)
Joseph Marsh , Thank you for taking time to come for your Medicare Wellness Visit. I appreciate your ongoing commitment to your health goals. Please review the following plan we discussed and let me know if I can assist you in the future.   Screening recommendations/referrals: Colonoscopy aged out Recommended yearly ophthalmology/optometry visit for glaucoma screening and checkup Recommended yearly dental visit for hygiene and checkup  Vaccinations: Influenza vaccine due annually in September/October Pneumococcal vaccine up to date Tdap vaccine DUE- recommend to get at your local pharmacy Shingles vaccine up to date    Advanced directives: recommended  Conditions/risks identified: advance age  Next appointment: yearly- in person.   Preventive Care 8 Years and Older, Male Preventive care refers to lifestyle choices and visits with your health care provider that can promote health and wellness. What does preventive care include? A yearly physical exam. This is also called an annual well check. Dental exams once or twice a year. Routine eye exams. Ask your health care provider how often you should have your eyes checked. Personal lifestyle choices, including: Daily care of your teeth and gums. Regular physical activity. Eating a healthy diet. Avoiding tobacco and drug use. Limiting alcohol use. Practicing safe sex. Taking low doses of aspirin every day. Taking vitamin and mineral supplements as recommended by your health care provider. What happens during an annual well check? The services and screenings done by your health care provider during your annual well check will depend on your age, overall health, lifestyle risk factors, and family history of disease. Counseling  Your health care provider may ask you questions about your: Alcohol use. Tobacco use. Drug use. Emotional well-being. Home and relationship well-being. Sexual activity. Eating habits. History of  falls. Memory and ability to understand (cognition). Work and work Statistician. Screening  You may have the following tests or measurements: Height, weight, and BMI. Blood pressure. Lipid and cholesterol levels. These may be checked every 5 years, or more frequently if you are over 51 years old. Skin check. Lung cancer screening. You may have this screening every year starting at age 26 if you have a 30-pack-year history of smoking and currently smoke or have quit within the past 15 years. Fecal occult blood test (FOBT) of the stool. You may have this test every year starting at age 3. Flexible sigmoidoscopy or colonoscopy. You may have a sigmoidoscopy every 5 years or a colonoscopy every 10 years starting at age 89. Prostate cancer screening. Recommendations will vary depending on your family history and other risks. Hepatitis C blood test. Hepatitis B blood test. Sexually transmitted disease (STD) testing. Diabetes screening. This is done by checking your blood sugar (glucose) after you have not eaten for a while (fasting). You may have this done every 1-3 years. Abdominal aortic aneurysm (AAA) screening. You may need this if you are a current or former smoker. Osteoporosis. You may be screened starting at age 68 if you are at high risk. Talk with your health care provider about your test results, treatment options, and if necessary, the need for more tests. Vaccines  Your health care provider may recommend certain vaccines, such as: Influenza vaccine. This is recommended every year. Tetanus, diphtheria, and acellular pertussis (Tdap, Td) vaccine. You may need a Td booster every 10 years. Zoster vaccine. You may need this after age 71. Pneumococcal 13-valent conjugate (PCV13) vaccine. One dose is recommended after age 46. Pneumococcal polysaccharide (PPSV23) vaccine. One dose is recommended after age 58. Talk to your health care  provider about which screenings and vaccines you need and  how often you need them. This information is not intended to replace advice given to you by your health care provider. Make sure you discuss any questions you have with your health care provider. Document Released: 06/27/2015 Document Revised: 02/18/2016 Document Reviewed: 04/01/2015 Elsevier Interactive Patient Education  2017 [place] Prevention in the Home Falls can cause injuries. They can happen to people of all ages. There are many things you can do to make your home safe and to help prevent falls. What can I do on the outside of my home? Regularly fix the edges of walkways and driveways and fix any cracks. Remove anything that might make you trip as you walk through a door, such as a raised step or threshold. Trim any bushes or trees on the path to your home. Use bright outdoor lighting. Clear any walking paths of anything that might make someone trip, such as rocks or tools. Regularly check to see if handrails are loose or broken. Make sure that both sides of any steps have handrails. Any raised decks and porches should have guardrails on the edges. Have any leaves, snow, or ice cleared regularly. Use sand or salt on walking paths during winter. Clean up any spills in your garage right away. This includes oil or grease spills. What can I do in the bathroom? Use night lights. Install grab bars by the toilet and in the tub and shower. Do not use towel bars as grab bars. Use non-skid mats or decals in the tub or shower. If you need to sit down in the shower, use a plastic, non-slip stool. Keep the floor dry. Clean up any water that spills on the floor as soon as it happens. Remove soap buildup in the tub or shower regularly. Attach bath mats securely with double-sided non-slip rug tape. Do not have throw rugs and other things on the floor that can make you trip. What can I do in the bedroom? Use night lights. Make sure that you have a light by your bed that is easy to  reach. Do not use any sheets or blankets that are too big for your bed. They should not hang down onto the floor. Have a firm chair that has side arms. You can use this for support while you get dressed. Do not have throw rugs and other things on the floor that can make you trip. What can I do in the kitchen? Clean up any spills right away. Avoid walking on wet floors. Keep items that you use a lot in easy-to-reach places. If you need to reach something above you, use a strong step stool that has a grab bar. Keep electrical cords out of the way. Do not use floor polish or wax that makes floors slippery. If you must use wax, use non-skid floor wax. Do not have throw rugs and other things on the floor that can make you trip. What can I do with my stairs? Do not leave any items on the stairs. Make sure that there are handrails on both sides of the stairs and use them. Fix handrails that are broken or loose. Make sure that handrails are as long as the stairways. Check any carpeting to make sure that it is firmly attached to the stairs. Fix any carpet that is loose or worn. Avoid having throw rugs at the top or bottom of the stairs. If you do have throw rugs, attach them to the  floor with carpet tape. Make sure that you have a light switch at the top of the stairs and the bottom of the stairs. If you do not have them, ask someone to add them for you. What else can I do to help prevent falls? Wear shoes that: Do not have high heels. Have rubber bottoms. Are comfortable and fit you well. Are closed at the toe. Do not wear sandals. If you use a stepladder: Make sure that it is fully opened. Do not climb a closed stepladder. Make sure that both sides of the stepladder are locked into place. Ask someone to hold it for you, if possible. Clearly mark and make sure that you can see: Any grab bars or handrails. First and last steps. Where the edge of each step is. Use tools that help you move  around (mobility aids) if they are needed. These include: Canes. Walkers. Scooters. Crutches. Turn on the lights when you go into a dark area. Replace any light bulbs as soon as they burn out. Set up your furniture so you have a clear path. Avoid moving your furniture around. If any of your floors are uneven, fix them. If there are any pets around you, be aware of where they are. Review your medicines with your doctor. Some medicines can make you feel dizzy. This can increase your chance of falling. Ask your doctor what other things that you can do to help prevent falls. This information is not intended to replace advice given to you by your health care provider. Make sure you discuss any questions you have with your health care provider. Document Released: 03/27/2009 Document Revised: 11/06/2015 Document Reviewed: 07/05/2014 Elsevier Interactive Patient Education  2017 [employer].

## 2022-03-04 NOTE — Telephone Encounter (Signed)
[name], borunda are scheduled for a virtual visit with your provider today.    Just as we do with appointments in the office, we must obtain your consent to participate.  Your consent will be active for this visit and any virtual visit you may have with one of our providers in the next 365 days.    If you have a MyChart account, I can also send a copy of this consent to you electronically.  All virtual visits are billed to your insurance company just like a traditional visit in the office.  As this is a virtual visit, video technology does not allow for your provider to perform a traditional examination.  This may limit your provider's ability to fully assess your condition.  If your provider identifies any concerns that need to be evaluated in person or the need to arrange testing such as labs, EKG, etc, we will make arrangements to do so.    Although advances in technology are sophisticated, we cannot ensure that it will always work on either your end or our end.  If the connection with a video visit is poor, we may have to switch to a telephone visit.  With either a video or telephone visit, we are not always able to ensure that we have a secure connection.   I need to obtain your verbal consent now.   Are you willing to proceed with your visit today?   Joseph Marsh has provided verbal consent on 03/04/2022 for a virtual visit (video or telephone).   Carroll Kinds, CMA 03/04/2022  11:13 AM

## 2022-03-04 NOTE — Progress Notes (Signed)
Subjective:   Joseph Marsh is a 76 y.o. male who presents for Medicare Annual/Subsequent preventive examination.  Review of Systems     Cardiac Risk Factors include: advanced age (>66mn, >>6women);male gender     Objective:    There were no vitals filed for this visit. There is no height or weight on file to calculate BMI.     03/04/2022   10:12 AM 02/26/2021    2:37 PM 02/25/2020    2:56 PM 01/14/2020    9:41 AM 12/29/2019    1:28 PM 06/07/2019    8:39 AM 05/30/2019    5:54 PM  Advanced Directives  Does Patient Have a Medical Advance Directive? No No No No No No No  Would patient like information on creating a medical advance directive?   No - Patient declined  No - Patient declined No - Guardian declined No - Patient declined    Current Medications (verified) Outpatient Encounter Medications as of 03/04/2022  Medication Sig   acetaminophen (TYLENOL) 500 MG tablet Take 500 mg by mouth as needed.   Ascorbic Acid (VITAMIN C) 1000 MG tablet Take 1,000 mg by mouth daily.    BIOTIN PO Take 1,000 mcg by mouth daily.    Cholecalciferol (VITAMIN D-3 PO) Take 2,000 Units by mouth daily. 2   Coenzyme Q10 (CO Q 10) 100 MG CAPS Take 300 mg by mouth daily.   Cyanocobalamin (VITAMIN B 12 PO) Take 2,500 mcg by mouth.   finasteride (PROSCAR) 5 MG tablet Take 5 mg by mouth daily.   fluocinonide-emollient (LIDEX-E) 0.05 % cream Apply 1 application topically 2 (two) times daily.   Glucos-Chondroit-Collag-Hyal (GLUCOSAMINE CHONDROIT-COLLAGEN PO) Take 1 capsule by mouth 2 (two) times daily.    MELATONIN PO Take 10 mg by mouth at bedtime.   Multiple Vitamin (MULTIVITAMIN) tablet Take 1 tablet by mouth daily.   tamsulosin (FLOMAX) 0.4 MG CAPS capsule Take 0.4 mg by mouth daily. Prescribed by urologist   Turmeric Curcumin 500 MG CAPS Take by mouth daily.   No facility-administered encounter medications on file as of 03/04/2022.    Allergies (verified) Patient has no known allergies.    History: Past Medical History:  Diagnosis Date   Basal cell carcinoma (BCC)    BPH (benign prostatic hyperplasia)    Cellulitis    Eczema    History of arthroscopic knee surgery    Hyperlipidemia    Kidney stone    Plantar fasciitis    Prostate cancer ([place]    Right shoulder pain    Sinusitis    Spinal stenosis    Squamous cell carcinoma in situ    Past Surgical History:  Procedure Laterality Date   CHOLECYSTECTOMY N/A 05/24/2013   Procedure: LAPAROSCOPIC CHOLECYSTECTOMY;  Surgeon: BEdward Jolly MD;  Location: [facility];  Service: General;  Laterality: N/A;   COLONOSCOPY  1996   polp removal   KNEE SURGERY     LITHOTRIPSY     MRI     sinus cauterization  1970   TONSILLECTOMY     Family History  Problem Relation Age of Onset   Other Mother        [facility]    Heart disease Father        died from. Cerebral Hemorrage, smoker and ETOH    Osteoporosis Sister    Diverticulitis Sister    Thyroid disease Sister    Breast cancer Sister    Diverticulitis  Sister    Melanoma Brother    Depression Son    Alcoholism Son    Pneumonia Nephew    Colon cancer Neg Hx    Esophageal cancer Neg Hx    Stomach cancer Neg Hx    Pancreatic cancer Neg Hx    Social History   Socioeconomic History   Marital status: Divorced    Spouse name: Not on file   Number of children: 2   Years of education: Not on file   Highest education level: Not on file  Occupational History    Employer: [employer]  Tobacco Use   Smoking status: Never   Smokeless tobacco: Never  Vaping Use   Vaping Use: Never used  Substance and Sexual Activity   Alcohol use: No   Drug use: No   Sexual activity: Yes  Other Topics Concern   Not on file  Social History Narrative   As of 06/19/2018 [place] New Patient Packet:      Diet: Fruits, nuts, protein, carbohydrates, sodium, fats, veggies, and some seafood       Caffeine: Average 5 12oz sodas per week        Married, if yes what year: Divorced x 2, married 1970-1999 (1st time) and 2004-2016 (second time)       Do you live in a house, apartment, assisted living, condo, trailer, ect: 3 bed room house, 1 stories, one person       Pets: No      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 No   Do you have difficulty preparing food or eating? No   Do you have difficulty managing your medications? No   Do you have difficulty managing your finances? No   Do you have difficulty affording your medications? No   Social Determinants of Radio broadcast assistant Strain: Not on file  Food Insecurity: Not on file  Transportation Needs: Not on file  Physical Activity: Not on file  Stress: Not on file  Social Connections: Not on file    Tobacco Counseling Counseling given: Not Answered   Clinical Intake:                 Diabetic?no         Activities of Daily Living    03/04/2022   11:27 AM  In your present state of health, do you have any difficulty performing the following activities:  Hearing? 0  Vision? 0  Difficulty concentrating or making decisions? 0  Walking or climbing stairs? 0  Dressing or bathing? 0  Doing errands, shopping? 0  Preparing Food and eating ? N  Using the Toilet? N  In the past six months, have you accidently leaked urine? Y  Do you have problems with loss of bowel control? N  Managing your Medications? N  Managing your Finances? N  Housekeeping or managing your Housekeeping? N    Patient Care Team: Wardell Honour, MD as PCP - General (Family Medicine) Gaynelle Arabian, MD as Consulting Physician (Orthopedic Surgery) Jarome Matin, MD as Consulting Physician (Dermatology) Raynelle Bring, MD as  Consulting Physician (Urology)  Indicate any recent Medical Services you may have received from other  than [facility] providers in the past year (date may be approximate).     Assessment:   This is a routine wellness examination for Joseph Marsh.  Hearing/Vision screen Hearing Screening - Comments:: Patient has no hearing issues significant enough for hearing aids.  Dietary issues and exercise activities discussed:     Goals Addressed   None    Depression Screen    03/04/2022   11:00 AM 02/26/2021    2:34 PM 01/20/2021    3:15 PM 02/25/2020    2:55 PM 01/14/2020    9:41 AM 06/07/2019    8:38 AM 11/09/2018    4:26 PM  PHQ 2/9 Scores  PHQ - 2 Score 0 0 0 0 0 0 0    Fall Risk    03/04/2022   11:08 AM 01/19/2022    2:56 PM 02/26/2021    2:37 PM 01/20/2021    3:15 PM 02/25/2020    2:54 PM  [place] in the past year? 0 0 0 0 0  Number falls in past yr: 0 0 0 0 0  Injury with Fall? 0 0 0 0 0  Risk for fall due to : No Fall Risks No Fall Risks No Fall Risks No Fall Risks   Follow up Falls evaluation completed Falls evaluation completed Falls evaluation completed Falls evaluation completed;Education provided;Falls prevention discussed     FALL RISK PREVENTION PERTAINING TO THE HOME:  Any stairs in or around the home? Yes  If so, are there any without handrails? No  Home free of loose throw rugs in walkways, pet beds, electrical cords, etc? Yes  Adequate lighting in your home to reduce risk of falls? Yes   ASSISTIVE DEVICES UTILIZED TO PREVENT FALLS:  Life alert? No  Use of a cane, walker or w/c? No  Grab bars in the bathroom? No  Shower chair or bench in shower? No  Elevated toilet seat or a handicapped toilet? No   TIMED UP AND GO:  Was the test performed? No .    Cognitive Function:    10/30/2018    8:16 AM  MMSE - Mini Mental State Exam  Orientation to time 5  Orientation to Place 5  Registration 3  Attention/ Calculation 5  Recall 0  Language- name 2 objects 2   Language- repeat 1  Language- follow 3 step command 3  Language- read & follow direction 1  Write a sentence 1  Copy design 1  Total score 27        03/04/2022   11:09 AM 02/26/2021    2:38 PM 02/25/2020    2:57 PM  6CIT Screen  What Year? 0 points 0 points 0 points  What month? 0 points 0 points 0 points  What time? 0 points 0 points 0 points  Count back from 20 0 points 0 points 0 points  Months in reverse 0 points 0 points 0 points  Repeat phrase 0 points 0 points 0 points  Total Score 0 points 0 points 0 points    Immunizations Immunization History  Administered Date(s) Administered   Fluad Quad(high Dose 65+) 03/12/2020   Influenza, High Dose Seasonal PF 02/15/2019   Influenza-Unspecified 04/16/2013, 06/24/2014, 06/19/2015, 06/28/2016, 04/14/2017, 03/14/2018, 05/19/2018   PFIZER(Purple Top)SARS-COV-2 Vaccination 07/20/2019, 08/15/2019, 04/11/2020, 11/21/2020   Pneumococcal Conjugate-13 07/23/2014   Pneumococcal Polysaccharide-23 12/18/2009   Pneumococcal-Unspecified 06/14/2016   Tdap 03/01/2011   Zoster Recombinat (Shingrix) 06/30/2018, 09/05/2018   Zoster, Live 03/01/2011    TDAP status: Due,  Education has been provided regarding the importance of this vaccine. Advised may receive this vaccine at local pharmacy or Health Dept. Aware to provide a copy of the vaccination record if obtained from local pharmacy or Health Dept. Verbalized acceptance and understanding.  Flu Vaccine status: Due, Education has been provided regarding the importance of this vaccine. Advised may receive this vaccine at local pharmacy or Health Dept. Aware to provide a copy of the vaccination record if obtained from local pharmacy or Health Dept. Verbalized acceptance and understanding.  Pneumococcal vaccine status: Up to date  Covid-19 vaccine status: Information provided on how to obtain vaccines.   Qualifies for Shingles Vaccine? Yes   Zostavax completed Yes   Shingrix Completed?:  Yes  Screening Tests Health Maintenance  Topic Date Due   COVID-19 Vaccine (5 - Pfizer risk series) 01/16/2021   TETANUS/TDAP  02/28/2021   INFLUENZA VACCINE  01/12/2022   Pneumonia Vaccine 27+ Years old  Completed   Hepatitis C Screening  Completed   Zoster Vaccines- Shingrix  Completed   HPV VACCINES  Aged Out   COLONOSCOPY (Pts 45-51yr Insurance coverage will need to be confirmed)  [place]  Topic Date Due   COVID-19 Vaccine (5 - Pfizer risk series) 01/16/2021   TETANUS/TDAP  02/28/2021   INFLUENZA VACCINE  01/12/2022    Colorectal cancer screening: No longer required.   Lung Cancer Screening: (Low Dose CT Chest recommended if Age 78-80years, 30 pack-year currently smoking OR have quit w/in 15years.) does not qualify.   Lung Cancer Screening Referral: na  Additional Screening:  Hepatitis C Screening: does qualify; Completed 2021   Vision Screening: Recommended annual ophthalmology exams for early detection of glaucoma and other disorders of the eye. Is the patient up to date with their annual eye exam?  Yes  Who is the provider or what is the name of the office in which the patient attends annual eye exams? [facility] If pt is not established with a provider, would they like to be referred to a provider to establish care? no.   Dental Screening: Recommended annual dental exams for proper oral hygiene  Community Resource Referral / Chronic Care Management: CRR required this visit?  No   CCM required this visit?  No      Plan:     I have personally reviewed and noted the following in the patient's chart:   Medical and social history Use of alcohol, tobacco or illicit drugs  Current medications and supplements including opioid prescriptions. Patient is not currently taking opioid prescriptions. Functional ability and status Nutritional status Physical activity Advanced directives List of other  physicians Hospitalizations, surgeries, and ER visits in previous 12 months Vitals Screenings to include cognitive, depression, and falls Referrals and appointments  In addition, I have reviewed and discussed with patient certain preventive protocols, quality metrics, and best practice recommendations. A written personalized care plan for preventive services as well as general preventive health recommendations were provided to patient.     JLauree Chandler NP   03/04/2022    Virtual Visit via Telephone Note  I connected with patient 03/04/22 at 11:00 AM EDT by telephone and verified that I am speaking with the correct person using two identifiers.  Location: Patient: home Provider: [facility]    I discussed the limitations, risks, security and privacy concerns of performing an evaluation and management service by telephone and the availability of in person appointments. I also discussed  with the patient that there may be a patient responsible charge related to this service. The patient expressed understanding and agreed to proceed.   I discussed the assessment and treatment plan with the patient. The patient was provided an opportunity to ask questions and all were answered. The patient agreed with the plan and demonstrated an understanding of the instructions.   The patient was advised to call back or seek an in-person evaluation if the symptoms worsen or if the condition fails to improve as anticipated.  I provided 15 minutes of non-face-to-face time during this encounter.  Carlos American. [name] Avs printed and mailed

## 2022-03-09 DIAGNOSIS — Z23 Encounter for immunization: Secondary | ICD-10-CM | POA: Diagnosis not present

## 2022-03-10 ENCOUNTER — Telehealth: Payer: Self-pay

## 2022-03-10 NOTE — Telephone Encounter (Signed)
Patient called and is very upset that no one has reached out to to explain the results of his lung CT, and help him better understand what is going on and what process needs to happen for him to get answers. He has already found a lung surgeon with [facility] and would like Dr. Sabra Heck to reach out to him personally As soon as possible told patient we would forward message to [facility] staff and someone would touch base with him when we were provided with more answers.

## 2022-03-11 ENCOUNTER — Other Ambulatory Visit: Payer: Self-pay | Admitting: Family Medicine

## 2022-03-11 DIAGNOSIS — Z0000 Encounter for general adult medical examination without abnormal findings: Secondary | ICD-10-CM

## 2022-03-11 NOTE — Telephone Encounter (Signed)
PET CT ordered per pt request

## 2022-03-11 NOTE — Telephone Encounter (Signed)
Patient called this morning, 03/11/22 and stated he need a order placed for CT scan because he scheduled appointment.

## 2022-03-16 ENCOUNTER — Telehealth: Payer: Self-pay | Admitting: *Deleted

## 2022-03-16 ENCOUNTER — Other Ambulatory Visit: Payer: Self-pay | Admitting: Family Medicine

## 2022-03-16 DIAGNOSIS — R918 Other nonspecific abnormal finding of lung field: Secondary | ICD-10-CM

## 2022-03-16 NOTE — Telephone Encounter (Signed)
[name], working in [facility],  spoke with patient yesterday and he was wanting to know the status of the referral that was placed.   Myna Hidalgo with Referrals:  [Yesterday 11:08 AM] [name]  Good Morning! Joseph Marsh MRN: [id number] is calling requesting an update for his referral. Thank you!  [Yesterday 11:22 AM] Robina Ade, Helene Kelp  are we talking about her radiology referral?  [Yesterday 11:41 AM] [name]  Yes, that one  [Yesterday 11:41 AM] Robina Ade, Helene Kelp  yes i have been working on it this morning trying to schedule it  [Yesterday 11:42 AM] [name]  Ok. Thank you! i will let [facility] know.   [Yesterday 11:44 AM] [name]  ok thanks  [Yesterday 2:35 PM] Robina Ade, Helene Kelp  with patient [name] a regular Nm PET would not need prior auth but this one does because its a axumin. [name] been trying to get it approved by pt medicare hopefully it will go thru. I will get back with u tomorrow if i dont get it approved before end of business day hopefully

## 2022-03-18 ENCOUNTER — Ambulatory Visit (HOSPITAL_COMMUNITY)
Admission: RE | Admit: 2022-03-18 | Discharge: 2022-03-18 | Disposition: A | Discharge status: Home / Self Care | Payer: Medicare Other | Source: Ambulatory Visit | Attending: Family Medicine | Admitting: Family Medicine

## 2022-03-18 DIAGNOSIS — R918 Other nonspecific abnormal finding of lung field: Secondary | ICD-10-CM | POA: Insufficient documentation

## 2022-03-18 LAB — GLUCOSE, CAPILLARY: Glucose-Capillary: 110 mg/dL — ABNORMAL HIGH (ref 70–99)

## 2022-03-18 MED ORDER — FLUDEOXYGLUCOSE F - 18 (FDG) INJECTION
7.8000 | Freq: Once | INTRAVENOUS | Status: AC
  Administered 2022-03-18: 8.29 via INTRAVENOUS

## 2022-03-24 NOTE — Progress Notes (Unsigned)
Called patient about his PET scan results, he had already reviewed them on My Chart. He is more concerned with the results the PET scan showed on his lungs. So much so he called [facility] and made an appointment with Dr. Shearon Stalls on 04/02/2022. Routed message to Dr. Sabra Heck.

## 2022-03-30 ENCOUNTER — Other Ambulatory Visit (HOSPITAL_COMMUNITY): Payer: No Typology Code available for payment source

## 2022-04-02 ENCOUNTER — Encounter: Payer: Self-pay | Admitting: Internal Medicine

## 2022-04-02 ENCOUNTER — Telehealth: Payer: Self-pay | Admitting: Internal Medicine

## 2022-04-02 ENCOUNTER — Ambulatory Visit (INDEPENDENT_AMBULATORY_CARE_PROVIDER_SITE_OTHER): Payer: Medicare Other | Admitting: Internal Medicine

## 2022-04-02 VITALS — BP 110/60 | HR 69 | Ht 70.0 in | Wt 165.4 lb

## 2022-04-02 DIAGNOSIS — Z8546 Personal history of malignant neoplasm of prostate: Secondary | ICD-10-CM

## 2022-04-02 DIAGNOSIS — R918 Other nonspecific abnormal finding of lung field: Secondary | ICD-10-CM

## 2022-04-02 NOTE — Telephone Encounter (Signed)
Called pt to inform him about his CT appt  Left pt a VM to call back with details.  FYI If patient calls back, thanks

## 2022-04-02 NOTE — Patient Instructions (Signed)
Please schedule follow up scheduled with APP in 3 months.  If my schedule is not open yet, we will contact you with a reminder closer to that time. Please call [phone] if you haven't heard from [place] a month before.   Please get your CT scan scheduled - our office will schedule this for you and call you in three months. Appointment with our office to follow up.    I think this nodules are more concerning for infection. I do not think this is lung cancer.

## 2022-04-02 NOTE — Telephone Encounter (Signed)
The CT scan was ordered for 3 month follow up.  Not for now. This was clearly stated in the order. Please reschedule.

## 2022-04-02 NOTE — Progress Notes (Signed)
Joseph Marsh    [id number]    08-25-1945  Primary Care Physician:Miller, Lillette Boxer, MD  Referring Physician: Wardell Honour, [address],  [place] [address] Reason for Consultation: abnormal ct chest Date of Consultation: 04/02/2022  Chief complaint:   Chief Complaint  Patient presents with   Consult    Abnormal CT and PET scan     HPI: Joseph Marsh is a 76 y.o. gentleman here for new patient evaluation of abnormal CT Chest and subsequent PET scan. History of prostate cancer treated in 2012 with radiation and brachytherapy.  Had an MRI done for follow up.  Part of his surveillance for prostate cancer showed a kidney lesion which is being followed. There were some changes on his lungs that were concerning which are being followed by CT Chest and then dedicated PET scan.   Denies fevers, chills, night sweats, weight loss.  He notes a little more cough than normal, occasional mucus production He does note chronic sinus drainage which is year round, doesn't take chronic medications for this.  Has daily chronic cough. Denies dyspnea.  He is able to complete all his ADLS and does yard work on the weekends which he can accomplish without dyspnea.   Notes several episodes of pneumonia, most recently in December 2020, treated with outpatient antibiotics.   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  Smoking history: never smoker, passive smoke exposure in childhood.   Social History   Occupational History    Employer: [employer]  Tobacco Use   Smoking status: Never   Smokeless tobacco: Never  Vaping Use   Vaping Use: Never used  Substance and Sexual Activity   Alcohol use: No   Drug use: No   Sexual activity: Yes    Relevant family history:  Family History  Problem Relation Age of Onset   Other Mother        Organ Failure, no  specified per [place] new patient packet    Heart disease Father        died from. Cerebral Hemorrage, smoker and ETOH    Osteoporosis Sister    Diverticulitis Sister    Thyroid disease Sister    Breast cancer Sister    Diverticulitis Sister    Melanoma Brother    Depression Son    Alcoholism Son    Pneumonia Nephew    Asthma Nephew    Colon cancer Neg Hx    Esophageal cancer Neg Hx    Stomach cancer Neg Hx    Pancreatic cancer Neg Hx     Past Medical History:  Diagnosis Date   Basal cell carcinoma (BCC)    BPH (benign prostatic hyperplasia)    Cellulitis    Eczema    History of arthroscopic knee surgery    Hyperlipidemia    Kidney stone    Plantar fasciitis    Prostate cancer ([place])    Right shoulder pain    Sinusitis    Spinal stenosis    Squamous cell carcinoma in situ     Past Surgical History:  Procedure Laterality Date   CHOLECYSTECTOMY N/A 05/24/2013   Procedure: LAPAROSCOPIC CHOLECYSTECTOMY;  Surgeon: Edward Jolly, MD;  Location: [facility];  Service: General;  Laterality: N/A;   COLONOSCOPY  1996   polp removal   KNEE SURGERY  LITHOTRIPSY     MRI     sinus cauterization  1970   TONSILLECTOMY      Physical Exam: Blood pressure 110/60, pulse 69, height '5\' 10"'$  (1.778 m), weight 165 lb 6.4 oz (75 kg), SpO2 95 %. Gen:      No acute distress ENT:  no nasal polyps, mucus membranes moist Lungs:    No increased respiratory effort, symmetric chest wall excursion, clear to auscultation bilaterally, no wheezes or crackles CV:         Regular rate and rhythm; no murmurs, rubs, or gallops.  No pedal edema Abd:      + bowel sounds; soft, non-tender; no distension MSK: no acute synovitis of DIP or PIP joints, no mechanics hands.  Skin:      Warm and dry; no rashes Neuro: normal speech, no focal facial asymmetry Psych: alert and oriented x3, normal mood and affect   Data Reviewed/Medical Decision Making:  Independent interpretation of tests: Imaging:  Review  of patient's CT Chest in 02/2022 and subsequent PET scan 03/2022 images revealed lower lobe predominant pulmonary nodules in peribronchovascular distribution, the largest around 1.8 cm in size, mostly centered around RLL. No pet avid lymphaddenopathy The patient's images have been independently reviewed by me.    PFTs:  Labs:  Lab Results  Component Value Date   WBC 6.0 01/03/2020   HGB 15.7 01/03/2020   HCT 48.0 01/03/2020   MCV 88.2 01/03/2020   PLT 205 01/03/2020   Lab Results  Component Value Date   NA 139 01/12/2021   K 3.9 01/12/2021   CL 103 01/12/2021   CO2 26 01/12/2021     Immunization status:  Immunization History  Administered Date(s) Administered   Fluad Quad(high Dose 65+) 03/12/2020   Influenza, High Dose Seasonal PF 02/15/2019   Influenza-Unspecified 04/16/2013, 06/24/2014, 06/19/2015, 06/28/2016, 04/14/2017, 03/14/2018, 05/19/2018   PFIZER(Purple Top)SARS-COV-2 Vaccination 07/20/2019, 08/15/2019, 04/11/2020, 11/21/2020   Pneumococcal Conjugate-13 07/23/2014   Pneumococcal Polysaccharide-23 12/18/2009   Pneumococcal-Unspecified 06/14/2016   Tdap 03/01/2011, 03/09/2022   Zoster Recombinat (Shingrix) 06/30/2018, 09/05/2018   Zoster, Live 03/01/2011     I reviewed prior external note(s) from PCP  I reviewed the result(s) of the labs and imaging as noted above.   I have ordered repeat CT Chest  Assessment:  Pulmonary nodules, peribronchovascular, most consistent with atypical infection History of prostate cancer 2012, in remission Chronic Sinusitis  Plan/Recommendations:  Mr. Laskaris has multiple pulmonary nodules in a lower predominant R>L peribronchovascular distribution, the largest of which is 1.8 cm in diameter.  Recommend CT Chest noncontrast to follow up on these nodules in 3 months, follow up with APP afterwards. Given the remainder of his PET scan findings, absence of other active malignancy and lymphadenopathy, absence of smoking history, there  is a much lower likelihood this is related to his malignancy.   Recommend OTC nasal rinses and intranasal steroids for his sinus disease.   We discussed disease management and progression at length today.    Return to Care: Return in about 3 months (around 07/03/2022).  Lenice Llamas, MD Pulmonary and [place]  CC: Wardell Honour, MD

## 2022-04-02 NOTE — Telephone Encounter (Signed)
I have placed a recall for the 3 month follow up after CT scan- PCC's please help coordinate when CT is rescheduled. Thanks.

## 2022-04-12 ENCOUNTER — Ambulatory Visit (HOSPITAL_COMMUNITY): Payer: No Typology Code available for payment source

## 2022-04-15 DIAGNOSIS — Z23 Encounter for immunization: Secondary | ICD-10-CM | POA: Diagnosis not present

## 2022-07-06 ENCOUNTER — Ambulatory Visit (HOSPITAL_COMMUNITY)
Admission: RE | Admit: 2022-07-06 | Discharge: 2022-07-06 | Disposition: A | Discharge status: Home / Self Care | Payer: Medicare Other | Source: Ambulatory Visit | Attending: Internal Medicine | Admitting: Internal Medicine

## 2022-07-06 DIAGNOSIS — R918 Other nonspecific abnormal finding of lung field: Secondary | ICD-10-CM | POA: Diagnosis not present

## 2022-07-06 DIAGNOSIS — J479 Bronchiectasis, uncomplicated: Secondary | ICD-10-CM | POA: Diagnosis not present

## 2022-07-15 ENCOUNTER — Telehealth: Payer: Self-pay | Admitting: Pulmonary Disease

## 2022-07-15 DIAGNOSIS — R918 Other nonspecific abnormal finding of lung field: Secondary | ICD-10-CM

## 2022-07-15 NOTE — Telephone Encounter (Signed)
[facility] Telephone Encounter  Contacted patient via phone. No answer. Left brief voicemail with callback number to discuss CT results (like inflammatory/infectious changes) and recommendation to repeat scan in 3 months.  Please schedule patient for follow-up with Dr. Shearon Stalls or APP in 3 months with CT chest prior to visit.

## 2022-08-13 ENCOUNTER — Telehealth: Payer: Self-pay | Admitting: Internal Medicine

## 2022-08-13 NOTE — Telephone Encounter (Signed)
PT calling about appt w/Dr. Shearon Stalls. A CT scan has been ordered but the PT just had one done 1/23. Does he still need another CT because someone made an appt for him this month.   Pls call to advise @ [phone]

## 2022-08-13 NOTE — Telephone Encounter (Signed)
Dr Loanne Drilling,  Since Dr Shearon Stalls is still not back in clinic just yet. Patient has CT scan that is due this month but he just had a CT Chest done in January 2024. Doe she still need the CT scan that you ordered for this month or is the one from January sufficient.  Please advise

## 2022-08-16 NOTE — Telephone Encounter (Signed)
ATC patient. Left detailed vm per DPR letting patient know that his CT from January was sufficient and his the next CT he needs to have done is at the end of April.   Nothing further needed.

## 2022-08-16 NOTE — Telephone Encounter (Signed)
The one from January is sufficient. Please cancel my CT order

## 2022-08-17 ENCOUNTER — Encounter: Payer: Self-pay | Admitting: Internal Medicine

## 2022-08-17 ENCOUNTER — Ambulatory Visit (INDEPENDENT_AMBULATORY_CARE_PROVIDER_SITE_OTHER): Payer: Medicare Other | Admitting: Internal Medicine

## 2022-08-17 VITALS — BP 122/74 | HR 87 | Temp 98.4°F | Ht 70.5 in | Wt 172.6 lb

## 2022-08-17 DIAGNOSIS — R918 Other nonspecific abnormal finding of lung field: Secondary | ICD-10-CM

## 2022-08-17 DIAGNOSIS — A310 Pulmonary mycobacterial infection: Secondary | ICD-10-CM | POA: Diagnosis not present

## 2022-08-17 DIAGNOSIS — J479 Bronchiectasis, uncomplicated: Secondary | ICD-10-CM | POA: Diagnosis not present

## 2022-08-17 NOTE — Progress Notes (Signed)
Joseph Marsh    [id number]    1945/08/03  Primary Care Physician:Miller, Lillette Boxer, MD Date of Appointment: 08/17/2022 Established Patient Visit  Chief complaint:   Chief Complaint  Patient presents with   Follow-up    Scan review      HPI: Joseph Marsh is a 77 y.o. gentleman here for new patient evaluation of abnormal CT Chest and subsequent PET scan. History of prostate cancer treated in 2012 with radiation and brachytherapy. Had an MRI done for follow up.  Part of his surveillance for prostate cancer showed a kidney lesion which is being followed. There were some changes on his lungs that were concerning which are being followed by CT Chest and then dedicated PET scan. This led to discovery of pulmonary nodules. Notes several episodes of pneumonia, most recently in December 2020, treated with outpatient antibiotics.   Interval Updates: Here for follow up after CT scan 3 month follow up. No fevers, chills, night sweats weight loss. No significant sputum production. Has chronic nasal drainage.  Feels well from a dyspnea standpoint, can do everything he wants to do.   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  Smoking history: never smoker, passive smoke exposure in childhood.    I have reviewed the patient's family social and past medical history and updated as appropriate.   Past Medical History:  Diagnosis Date   Basal cell carcinoma (BCC)    BPH (benign prostatic hyperplasia)    Cellulitis    Eczema    History of arthroscopic knee surgery    Hyperlipidemia    Kidney stone    Plantar fasciitis    Prostate cancer ([place])    Right shoulder pain    Sinusitis    Spinal stenosis    Squamous cell carcinoma in situ     Past Surgical History:  Procedure Laterality Date   CHOLECYSTECTOMY N/A 05/24/2013   Procedure: LAPAROSCOPIC CHOLECYSTECTOMY;   Surgeon: Edward Jolly, MD;  Location: [facility];  Service: General;  Laterality: N/A;   COLONOSCOPY  1996   polp removal   KNEE SURGERY     LITHOTRIPSY     MRI     sinus cauterization  1970   TONSILLECTOMY      Family History  Problem Relation Age of Onset   Other Mother        Organ Failure, no specified per [facility] new patient packet    Heart disease Father        died from. Cerebral Hemorrage, smoker and ETOH    Osteoporosis Sister    Diverticulitis Sister    Thyroid disease Sister    Breast cancer Sister    Diverticulitis Sister    Melanoma Brother    Depression Son    Alcoholism Son    Pneumonia Nephew    Asthma Nephew    Colon cancer Neg Hx    Esophageal cancer Neg Hx    Stomach cancer Neg Hx    Pancreatic cancer Neg Hx     Social History   Occupational History    Employer: [employer]  Tobacco Use   Smoking status: Never   Smokeless tobacco: Never  Vaping Use   Vaping Use: Never used  Substance and Sexual Activity   Alcohol use: No   Drug use: No   Sexual activity: Yes     Physical  Exam: Blood pressure 122/74, pulse 87, temperature 98.4 F (36.9 C), temperature source Oral, height 5' 10.5" (1.791 m), weight 172 lb 9.6 oz (78.3 kg), SpO2 96 %.  Gen:      No acute distress Lungs:    No increased respiratory effort, symmetric chest wall excursion, clear to auscultation bilaterally, no wheezes or crackles CV:         Regular rate and rhythm; no murmurs, rubs, or gallops.  No pedal edema   Data Reviewed: Imaging: I have personally reviewed the CT Chest Jan 2024 which shows waxing/waning pulmonary nodules int he bilateral bases with a R>L distribution and central and lower lobe bronchiectasis. This is most likely infectious.     Labs:  Immunization status: Immunization History  Administered Date(s) Administered   Fluad Quad(high Dose 65+) 03/12/2020   Influenza, High Dose Seasonal PF 02/15/2019, 04/15/2022   Influenza-Unspecified 04/16/2013,  06/24/2014, 06/19/2015, 06/28/2016, 04/14/2017, 03/14/2018, 05/19/2018   Moderna Covid-19 Vaccine Bivalent Booster 28yr & up 04/15/2022   PFIZER(Purple Top)SARS-COV-2 Vaccination 07/20/2019, 08/15/2019, 04/11/2020, 11/21/2020   Pneumococcal Conjugate-13 07/23/2014   Pneumococcal Polysaccharide-23 12/18/2009   Pneumococcal-Unspecified 06/14/2016   Tdap 03/01/2011, 03/09/2022   Zoster Recombinat (Shingrix) 06/30/2018, 09/05/2018   Zoster, Live 03/01/2011    External Records Personally Reviewed: pulmonary, pcp  Assessment:  Multiple pulmonary nodules Bronchiectasis without complication History of prostate cancer and radiation  Plan/Recommendations:  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  Otherwise it would require a bronchoscopy procedure.  I would advocate for watchful waiting with scans and monitoring your symptoms at this time before jumping into procedures and prolonged courses of antibiotics. Tell me if you have a change in your respiratory symptoms.   CT Chest in 6 months September 2024 - we can try to schedule on the same day as your CT Abdomen for urology. Just tell them when they call you to schedule.    Follow up with me after the scan.   I spent 30 minutes on 08/17/2022 in care of this patient including face to face time and non-face to face time spent charting, review of outside records, and coordination of care.   Return to Care: Return in about 6 months (around 02/17/2023).   NLenice Llamas MD Pulmonary and [facility]

## 2022-08-17 NOTE — Patient Instructions (Addendum)
Please schedule follow up scheduled with myself in 6 months.  If my schedule is not open yet, we will contact you with a reminder closer to that time. Please call [phone] if you haven't heard from [place] a month before.   Before your next visit I would like you to have: CT Chest in 6 months September 2024 - we can try to schedule on the same day as your CT Abdomen for urology. Just tell them when they call you to schedule.    Follow up with me after the scan.   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  Otherwise it would require a bronchoscopy procedure.  I would advocate for watchful waiting with scans and monitoring your symptoms at this time before jumping into procedures and prolonged courses of antibiotics. Tell me if you have a change in your respiratory symptoms.

## 2022-08-26 DIAGNOSIS — H00014 Hordeolum externum left upper eyelid: Secondary | ICD-10-CM | POA: Diagnosis not present

## 2022-09-22 DIAGNOSIS — H00014 Hordeolum externum left upper eyelid: Secondary | ICD-10-CM | POA: Diagnosis not present

## 2022-10-11 ENCOUNTER — Other Ambulatory Visit (HOSPITAL_COMMUNITY): Payer: Medicare Other

## 2022-10-11 ENCOUNTER — Ambulatory Visit (HOSPITAL_COMMUNITY): Payer: Medicare Other

## 2022-10-12 DIAGNOSIS — H02403 Unspecified ptosis of bilateral eyelids: Secondary | ICD-10-CM | POA: Diagnosis not present

## 2022-10-12 DIAGNOSIS — H40013 Open angle with borderline findings, low risk, bilateral: Secondary | ICD-10-CM | POA: Diagnosis not present

## 2022-10-12 DIAGNOSIS — H00014 Hordeolum externum left upper eyelid: Secondary | ICD-10-CM | POA: Diagnosis not present

## 2022-10-18 DIAGNOSIS — Z8546 Personal history of malignant neoplasm of prostate: Secondary | ICD-10-CM | POA: Diagnosis not present

## 2022-10-18 DIAGNOSIS — R197 Diarrhea, unspecified: Secondary | ICD-10-CM | POA: Diagnosis not present

## 2022-10-18 DIAGNOSIS — R112 Nausea with vomiting, unspecified: Secondary | ICD-10-CM | POA: Diagnosis not present

## 2022-11-01 ENCOUNTER — Telehealth: Payer: Self-pay | Admitting: Internal Medicine

## 2022-11-01 NOTE — Telephone Encounter (Signed)
Patient would like to discuss treatment plan. Patient phone number is [phone].

## 2022-11-02 DIAGNOSIS — L820 Inflamed seborrheic keratosis: Secondary | ICD-10-CM | POA: Diagnosis not present

## 2022-11-02 DIAGNOSIS — Z85828 Personal history of other malignant neoplasm of skin: Secondary | ICD-10-CM | POA: Diagnosis not present

## 2022-11-03 NOTE — Telephone Encounter (Signed)
Patient calling back, very confused about treatment plan. Patient went to the imaging center on 4/29 (CT appt was still on AVS when printed at 3/5 appt)  to find that we canceled it- patient wants to know why April CT was canceled and pushed to September (patient thought he should have a CT scan every 3 months). Wants to hear from Dr. Celine Mans that this is the appropriate plan that she wants and not another CT scan before September.    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  Please advise.

## 2022-11-03 NOTE — Telephone Encounter (Signed)
As my note says - 6 month follow up scan is sufficient given he has no substantial respiratory symptoms.  Would only send sputum culture if there is deep respiratory sample - nasal drainage not sufficient.  I can see him sooner if his symptoms change or worsen.

## 2022-11-03 NOTE — Telephone Encounter (Signed)
Spoke with patient. Went over recc from Dr. Celine Mans. Offered patient a sooner apt-states nothing has changed or is worse. Advised patient to call back if something changes so we can get him seen. NFN at this time

## 2022-11-09 ENCOUNTER — Other Ambulatory Visit: Payer: Self-pay | Admitting: Urology

## 2022-11-09 DIAGNOSIS — D49512 Neoplasm of unspecified behavior of left kidney: Secondary | ICD-10-CM

## 2023-01-10 DIAGNOSIS — D225 Melanocytic nevi of trunk: Secondary | ICD-10-CM | POA: Diagnosis not present

## 2023-01-10 DIAGNOSIS — D2261 Melanocytic nevi of right upper limb, including shoulder: Secondary | ICD-10-CM | POA: Diagnosis not present

## 2023-01-10 DIAGNOSIS — Z85828 Personal history of other malignant neoplasm of skin: Secondary | ICD-10-CM | POA: Diagnosis not present

## 2023-01-10 DIAGNOSIS — L821 Other seborrheic keratosis: Secondary | ICD-10-CM | POA: Diagnosis not present

## 2023-01-10 DIAGNOSIS — D224 Melanocytic nevi of scalp and neck: Secondary | ICD-10-CM | POA: Diagnosis not present

## 2023-01-10 DIAGNOSIS — D1801 Hemangioma of skin and subcutaneous tissue: Secondary | ICD-10-CM | POA: Diagnosis not present

## 2023-01-10 DIAGNOSIS — L812 Freckles: Secondary | ICD-10-CM | POA: Diagnosis not present

## 2023-01-21 ENCOUNTER — Other Ambulatory Visit: Payer: Medicare Other

## 2023-01-21 ENCOUNTER — Other Ambulatory Visit: Payer: Self-pay

## 2023-01-21 DIAGNOSIS — A310 Pulmonary mycobacterial infection: Secondary | ICD-10-CM

## 2023-02-02 ENCOUNTER — Encounter: Payer: Self-pay | Admitting: Family

## 2023-02-02 ENCOUNTER — Telehealth: Payer: Medicare Other | Admitting: Family

## 2023-02-02 DIAGNOSIS — J101 Influenza due to other identified influenza virus with other respiratory manifestations: Secondary | ICD-10-CM

## 2023-02-02 DIAGNOSIS — J069 Acute upper respiratory infection, unspecified: Secondary | ICD-10-CM

## 2023-02-02 DIAGNOSIS — U071 COVID-19: Secondary | ICD-10-CM

## 2023-02-02 MED ORDER — OSELTAMIVIR PHOSPHATE 75 MG PO CAPS: 75.0000 mg | ORAL_CAPSULE | Freq: Two times a day (BID) | ORAL | 0 refills | Status: AC

## 2023-02-02 MED ORDER — NIRMATRELVIR/RITONAVIR (PAXLOVID)TABLET: 3.0000 | ORAL_TABLET | Freq: Two times a day (BID) | ORAL | 0 refills | Status: AC

## 2023-02-02 MED ORDER — ZINC GLUCONATE 50 MG PO TABS: 50.0000 mg | ORAL_TABLET | Freq: Every day | ORAL | 0 refills | Status: AC

## 2023-02-02 NOTE — Progress Notes (Addendum)
This service is provided via telemedicine  No vital signs collected/recorded due to the encounter was a telemedicine visit.   Location of patient (ex: home, work):  Home   Patient consents to a telephone visit:  Yes, 03/04/2022  Location of the provider (ex: office, home):  [facility]  Name of any referring provider: Venita Sheffield, MD   Names of all persons participating in the telemedicine service and their role in the encounter: Toma Copier B/CMA, Daivon Rayos, C, NP and patient  Time spent on call:  11 minutes     Provider: Fawne Hughley FNP-C  Venita Sheffield, MD  Patient Care Team: Venita Sheffield, MD as PCP - General (Internal Medicine) Ollen Gross, MD as Consulting Physician (Orthopedic Surgery) Donzetta Starch, MD as Consulting Physician (Dermatology) Heloise Purpura, MD as Consulting Physician (Urology)  Extended Emergency Contact Information Primary Emergency Contact: Bevely Palmer of [place] Home Phone: [phone] Mobile Phone: [phone] Relation: Son  Code Status:  Full code  Goals of care: Advanced Directive information    03/04/2022   10:12 AM  Advanced Directives  Does Patient Have a Medical Advance Directive? No     Chief Complaint  Patient presents with   Acute Visit    COVID and Flu A positive( last night)     HPI:  Pt is a 77 y.o. Marsh seen today for an acute visit for evaluation of cough and fever. Cough started yesterday morning.He did not sleep well.He tested positive for COVID-19 and Flu today.test showed positive for COVID-19 and Influenza A. Has had sneezing,chest congestion and nasal congestion.Generally feels " bad".  Temperature 101 another one indicated 103.8.Took tylenol 500 mg 2 tablet last night but nos since then. He continues to feel like he has a fever will be taking another dose of tylenol. Does not feel like eating but tries to eat.Has been drinking  fluids. He denies any fatigue,chest tightness,chest pain,palpitation or shortness of breath.  Past Medical History:  Diagnosis Date   Basal cell carcinoma (BCC)    BPH (benign prostatic hyperplasia)    Cellulitis    Eczema    History of arthroscopic knee surgery    Hyperlipidemia    Kidney stone    Plantar fasciitis    Prostate cancer ([facility])    Right shoulder pain    Sinusitis    Spinal stenosis    Squamous cell carcinoma in situ    Past Surgical History:  Procedure Laterality Date   CHOLECYSTECTOMY N/A 05/24/2013   Procedure: LAPAROSCOPIC CHOLECYSTECTOMY;  Surgeon: Mariella Saa, MD;  Location: [facility];  Service: General;  Laterality: N/A;   COLONOSCOPY  1996   polp removal   KNEE SURGERY     LITHOTRIPSY     MRI     sinus cauterization  1970   TONSILLECTOMY      No Known Allergies  Outpatient Encounter Medications as of 02/02/2023  Medication Sig   acetaminophen (TYLENOL) 500 MG tablet Take 500 mg by mouth as needed.   Ascorbic Acid (VITAMIN C) 1000 MG tablet Take 1,000 mg by mouth daily.    BIOTIN PO Take 1,000 mcg by mouth daily.    Cholecalciferol (VITAMIN D-3 PO) Take 2,000 Units by mouth daily. 2   Coenzyme Q10 (CO Q 10) 100 MG CAPS Take 300 mg by mouth daily.   Cyanocobalamin (VITAMIN B 12 PO) Take 2,500 mcg by mouth.   finasteride (PROSCAR) 5 MG tablet Take 5 mg by mouth daily.  fluocinonide-emollient (LIDEX-E) 0.05 % cream Apply 1 application topically 2 (two) times daily.   Glucos-Chondroit-Collag-Hyal (GLUCOSAMINE CHONDROIT-COLLAGEN PO) Take 1 capsule by mouth 2 (two) times daily.    MELATONIN PO Take 10 mg by mouth at bedtime.   Multiple Vitamin (MULTIVITAMIN) tablet Take 1 tablet by mouth daily.   tamsulosin (FLOMAX) 0.4 MG CAPS capsule Take 0.4 mg by mouth daily. Prescribed by urologist   Turmeric Curcumin 500 MG CAPS Take by mouth daily.   No facility-administered encounter medications on file as of 02/02/2023.    Review of Systems   Constitutional:  Positive for appetite change, chills, fatigue and fever. Negative for unexpected weight change.  HENT:  Positive for congestion and rhinorrhea. Negative for dental problem, ear discharge, ear pain, facial swelling, hearing loss, nosebleeds, postnasal drip, sinus pressure, sinus pain, sneezing, sore throat and tinnitus.   Eyes:  Negative for pain, discharge, redness, itching and visual disturbance.  Respiratory:  Positive for cough. Negative for chest tightness, shortness of breath and wheezing.   Cardiovascular:  Negative for chest pain, palpitations and leg swelling.  Gastrointestinal:  Negative for abdominal distention, abdominal pain, blood in stool, constipation, diarrhea, nausea and vomiting.  Genitourinary:  Negative for difficulty urinating, dysuria, flank pain, frequency and urgency.  Musculoskeletal:  Negative for arthralgias, back pain, gait problem, joint swelling, myalgias and neck pain.  Skin:  Negative for color change, pallor and rash.  Neurological:  Negative for dizziness, weakness, light-headedness and headaches.  Hematological:  Does not bruise/bleed easily.    Immunization History  Administered Date(s) Administered   Fluad Quad(high Dose 65+) 03/12/2020   Influenza, High Dose Seasonal PF 02/15/2019, 04/15/2022   Influenza-Unspecified 04/16/2013, 06/24/2014, 06/19/2015, 06/28/2016, 04/14/2017, 03/14/2018, 05/19/2018   Moderna Covid-19 Vaccine Bivalent Booster 32yrs & up 04/15/2022   PFIZER(Purple Top)SARS-COV-2 Vaccination 07/20/2019, 08/15/2019, 04/11/2020, 11/21/2020   Pneumococcal Conjugate-13 07/23/2014   Pneumococcal Polysaccharide-23 12/18/2009   Pneumococcal-Unspecified 06/14/2016   Tdap 03/01/2011, 03/09/2022   Zoster Recombinant(Shingrix) 06/30/2018, 09/05/2018   Zoster, Live 03/01/2011   Pertinent  Health Maintenance Due  Topic Date Due   INFLUENZA VACCINE  01/13/2023   Colonoscopy  Discontinued      01/20/2021    3:15 PM 02/26/2021     2:37 PM 01/19/2022    2:56 PM 03/04/2022   11:08 AM 02/02/2023    3:30 PM  Fall Risk  Falls in the past year? 0 0 0 0 0  Was there an injury with Fall? 0 0 0 0 0  Fall Risk Category Calculator 0 0 0 0 0  Fall Risk Category (Retired) Low Low Low Low   (RETIRED) Patient Fall Risk Level Low fall risk Low fall risk Low fall risk Low fall risk   Patient at Risk for Falls Due to No Fall Risks No Fall Risks No Fall Risks No Fall Risks No Fall Risks  Fall risk Follow up Falls evaluation completed;Education provided;Falls prevention discussed Falls evaluation completed Falls evaluation completed Falls evaluation completed Falls evaluation completed   Functional Status Survey:    There were no vitals filed for this visit. There is no height or weight on file to calculate BMI. Physical Exam Constitutional:      General: He is not in acute distress.    Appearance: He is not ill-appearing.  Pulmonary:     Effort: Pulmonary effort is normal. No respiratory distress.  Neurological:     Mental Status: He is alert and oriented to person, place, and time.  Psychiatric:  Mood and Affect: Mood normal.        Behavior: Behavior normal.     Labs reviewed: No results for input(s): "NA", "K", "CL", "CO2", "GLUCOSE", "BUN", "CREATININE", "CALCIUM", "MG", "PHOS" in the last 8760 hours. No results for input(s): "AST", "ALT", "ALKPHOS", "BILITOT", "PROT", "ALBUMIN" in the last 8760 hours. No results for input(s): "WBC", "NEUTROABS", "HGB", "HCT", "MCV", "PLT" in the last 8760 hours.  Lab Results  Component Value Date   HGBA1C 5.9 (H) 01/19/2022   Lab Results  Component Value Date   CHOL 150 01/19/2022   HDL 39 (L) 01/19/2022   LDLCALC 84 01/19/2022   TRIG 174 (H) 01/19/2022   CHOLHDL 3.8 01/19/2022    Significant Diagnostic Results in last 30 days:  No results found.  Assessment/Plan  1. COVID-19 virus infection Tested positive for COVID-19 infection today  Reports high fever -  discussed Paxlovid  - advised to increase fluid intake/soup/tea - OTC mucinex for cough  - advised to continue on vitamin D,vit C and start on Zinc  - nirmatrelvir/ritonavir (PAXLOVID) 20 x 150 MG & 10 x 100MG  TABS; Take 3 tablets by mouth 2 (two) times daily for 5 days. (Take nirmatrelvir 150 mg two tablets twice daily for 5 days and ritonavir 100 mg one tablet twice daily for 5 days) Patient GFR is 90  Dispense: 30 tablet; Refill: 0 - zinc gluconate 50 MG tablet; Take 1 tablet (50 mg total) by mouth daily for 14 days.  Dispense: 14 tablet; Refill: 0  2. Influenza A H1N1 infection Reports Influenza A positive  - discussed starting on TamiFlu  - oseltamivir (TAMIFLU) 75 MG capsule; Take 1 capsule (75 mg total) by mouth 2 (two) times daily for 5 days.  Dispense: 10 capsule; Refill: 0   Family/ staff Communication: Reviewed plan of care with patient verbalized understanding   Labs/tests ordered: None   Next Appointment: Return if symptoms worsen or fail to improve.  I connected with  Rudolfo A Thul on 02/02/23 by a video enabled telemedicine application and verified that I am speaking with the correct person using two identifiers.   I discussed the limitations of evaluation and management by telemedicine. The patient expressed understanding and agreed to proceed.  Spent 11 minutes of face to face with patient  >50% time spent counseling; reviewing medical record; labs; and developing future plan of care.   Caesar Bookman, NP

## 2023-02-02 NOTE — Patient Instructions (Signed)
-   Take Zinc 50 mg tablet one by mouth daily for 14 days  - continue on Vitamin C tablet daily x 14 days  - continue on vitamin D supplement  - Tylenol as needed for fever or body aches  - over the counter Mucinex as needed for cough  - increase your fruits intake in your diet  - increase your water intake to 6-8 glasses of water daily  - Notify provider or go to ED if you develop any chest tightness,chest pain or shortness of breath

## 2023-02-03 ENCOUNTER — Other Ambulatory Visit: Payer: Medicare Other

## 2023-02-03 DIAGNOSIS — A310 Pulmonary mycobacterial infection: Secondary | ICD-10-CM

## 2023-02-04 ENCOUNTER — Telehealth: Payer: Medicare Other

## 2023-02-04 NOTE — Telephone Encounter (Signed)
Patient called regarding appointment on 9/17.at 3:00 Patient states that he thinks he made need longer than 20 minutes for this appointment die to this being his first time seeing provider and a number of medical issues and scans he will be having done prior to the 9/17 appointment. Patient states that he usually has his lab work done a few days prior to appointment. He would like to know if appointment time can or needs to be extended as well as getting labs done before appointment and which ones need to be done. Please advise.  Message sent to Dr. Jacquenette Shone

## 2023-02-05 LAB — RESPIRATORY CULTURE OR RESPIRATORY AND SPUTUM CULTURE
MICRO NUMBER:: 15368252
RESULT:: NORMAL
SPECIMEN QUALITY:: ADEQUATE

## 2023-02-07 NOTE — Telephone Encounter (Signed)
Patient is wanting to know if he should have labs done before his appointment.

## 2023-02-07 NOTE — Telephone Encounter (Signed)
Venita Sheffield, MD  to Me     02/07/23  9:33 AM Can you schedule him at 11 am on 9/17 , thanks

## 2023-02-07 NOTE — Telephone Encounter (Addendum)
Are you anting patient to have a 40 minute appointment slot?

## 2023-02-07 NOTE — Telephone Encounter (Signed)
Patient has been scheduled for 11:00 on 9/17

## 2023-02-08 ENCOUNTER — Other Ambulatory Visit: Payer: Self-pay

## 2023-02-08 DIAGNOSIS — M722 Plantar fascial fibromatosis: Secondary | ICD-10-CM

## 2023-02-08 DIAGNOSIS — E7800 Pure hypercholesterolemia, unspecified: Secondary | ICD-10-CM

## 2023-02-08 DIAGNOSIS — K8000 Calculus of gallbladder with acute cholecystitis without obstruction: Secondary | ICD-10-CM

## 2023-02-08 DIAGNOSIS — Z5181 Encounter for therapeutic drug level monitoring: Secondary | ICD-10-CM

## 2023-02-08 DIAGNOSIS — R7303 Prediabetes: Secondary | ICD-10-CM

## 2023-02-08 DIAGNOSIS — M48061 Spinal stenosis, lumbar region without neurogenic claudication: Secondary | ICD-10-CM

## 2023-02-08 DIAGNOSIS — K862 Cyst of pancreas: Secondary | ICD-10-CM

## 2023-02-08 DIAGNOSIS — R739 Hyperglycemia, unspecified: Secondary | ICD-10-CM

## 2023-02-08 NOTE — Telephone Encounter (Signed)
Joseph Sheffield, MD  to Me     02/07/23  1:10 PM Can you order cbc, cmp, lipid panel, TSH , b12, vitd  labs prior to their appt,

## 2023-02-08 NOTE — Telephone Encounter (Signed)
Orders have been pended (in an orders only encounter)and sent to Dr. Jacquenette Shone to associate diagnosis and sign.

## 2023-02-11 ENCOUNTER — Other Ambulatory Visit: Payer: Self-pay | Admitting: Internal Medicine

## 2023-02-11 ENCOUNTER — Ambulatory Visit: Payer: Medicare Other | Admitting: Adult Health

## 2023-02-11 ENCOUNTER — Encounter: Payer: Self-pay | Admitting: Adult Health

## 2023-02-11 VITALS — BP 122/78 | HR 72 | Temp 97.1°F | Resp 18 | Ht 70.5 in | Wt 171.0 lb

## 2023-02-11 DIAGNOSIS — J1282 Pneumonia due to coronavirus disease 2019: Secondary | ICD-10-CM

## 2023-02-11 DIAGNOSIS — U071 COVID-19: Secondary | ICD-10-CM | POA: Diagnosis not present

## 2023-02-11 DIAGNOSIS — A310 Pulmonary mycobacterial infection: Secondary | ICD-10-CM

## 2023-02-11 MED ORDER — SACCHAROMYCES BOULARDII 250 MG PO CAPS: 250.0000 mg | ORAL_CAPSULE | Freq: Two times a day (BID) | ORAL | 0 refills | Status: AC

## 2023-02-11 MED ORDER — DOXYCYCLINE HYCLATE 100 MG PO TABS: 100.0000 mg | ORAL_TABLET | Freq: Two times a day (BID) | ORAL | 0 refills | Status: AC

## 2023-02-11 NOTE — Progress Notes (Unsigned)
[facility] clinic  Provider:   Code Status: *** Goals of Care:     02/11/2023    2:07 PM  Advanced Directives  Does Patient Have a Medical Advance Directive? No  Would patient like information on creating a medical advance directive? No - Patient declined     Chief Complaint  Patient presents with   Acute Visit    Patient tested positive for COVID 8/20     HPI: Patient is a 77 y.o. male seen today for an acute visit for  Past Medical History:  Diagnosis Date   Basal cell carcinoma (BCC)    BPH (benign prostatic hyperplasia)    Cellulitis    Eczema    History of arthroscopic knee surgery    Hyperlipidemia    Kidney stone    Plantar fasciitis    Prostate cancer (HCC)    Right shoulder pain    Sinusitis    Spinal stenosis    Squamous cell carcinoma in situ     Past Surgical History:  Procedure Laterality Date   CHOLECYSTECTOMY N/A 05/24/2013   Procedure: LAPAROSCOPIC CHOLECYSTECTOMY;  Surgeon: Mariella Saa, MD;  Location: [facility];  Service: General;  Laterality: N/A;   COLONOSCOPY  1996   polp removal   KNEE SURGERY     LITHOTRIPSY     MRI     sinus cauterization  1970   TONSILLECTOMY      No Known Allergies  Outpatient Encounter Medications as of 02/11/2023  Medication Sig   acetaminophen (TYLENOL) 500 MG tablet Take 500 mg by mouth as needed.   Ascorbic Acid (VITAMIN C) 1000 MG tablet Take 1,000 mg by mouth daily.    BIOTIN PO Take 1,000 mcg by mouth daily.    Cholecalciferol (VITAMIN D-3 PO) Take 2,000 Units by mouth daily. 2   Coenzyme Q10 (CO Q 10) 100 MG CAPS Take 300 mg by mouth daily.   Cyanocobalamin (VITAMIN B 12 PO) Take 2,500 mcg by mouth.   finasteride (PROSCAR) 5 MG tablet Take 5 mg by mouth daily.   fluocinonide-emollient (LIDEX-E) 0.05 % cream Apply 1 application topically 2 (two) times daily.   Glucos-Chondroit-Collag-Hyal (GLUCOSAMINE CHONDROIT-COLLAGEN PO) Take 1 capsule by mouth 2 (two) times daily.    MELATONIN PO Take 10 mg by  mouth at bedtime.   Multiple Vitamin (MULTIVITAMIN) tablet Take 1 tablet by mouth daily.   tamsulosin (FLOMAX) 0.4 MG CAPS capsule Take 0.4 mg by mouth daily. Prescribed by urologist   Turmeric Curcumin 500 MG CAPS Take by mouth daily.   zinc gluconate 50 MG tablet Take 1 tablet (50 mg total) by mouth daily for 14 days.   No facility-administered encounter medications on file as of 02/11/2023.    Review of Systems:  Review of Systems  Constitutional:  Positive for fatigue. Negative for activity change, appetite change and fever.  HENT:  Negative for sore throat.   Eyes: Negative.   Respiratory:  Positive for cough. Negative for shortness of breath and wheezing.   Cardiovascular:  Negative for chest pain and leg swelling.  Gastrointestinal:  Negative for abdominal distention, diarrhea and vomiting.  Genitourinary:  Negative for dysuria, frequency and urgency.  Skin:  Negative for color change.  Neurological:  Negative for dizziness and headaches.  Psychiatric/Behavioral:  Negative for behavioral problems and sleep disturbance. The patient is not nervous/anxious.     Health Maintenance  Topic Date Due   COVID-19 Vaccine (6 - 2023-24 season) 06/10/2022   INFLUENZA VACCINE  01/13/2023   Medicare Annual Wellness (AWV)  03/12/2023   DTaP/Tdap/Td (3 - Td or Tdap) 03/09/2032   Pneumonia Vaccine 73+ Years old  Completed   Hepatitis C Screening  Completed   Zoster Vaccines- Shingrix  Completed   HPV VACCINES  Aged Out   Colonoscopy  Discontinued    Physical Exam: Vitals:   02/11/23 1347  BP: 122/78  Pulse: 72  Resp: 18  Temp: (!) 97.1 F (36.2 C)  SpO2: 98%  Weight: 171 lb (77.6 kg)  Height: 5' 10.5" (1.791 m)   Body mass index is 24.19 kg/m. Physical Exam Constitutional:      General: He is not in acute distress.    Appearance: Normal appearance.  HENT:     Head: Normocephalic and atraumatic.     Mouth/Throat:     Mouth: Mucous membranes are moist.  Eyes:      Conjunctiva/sclera: Conjunctivae normal.  Cardiovascular:     Rate and Rhythm: Normal rate and regular rhythm.     Pulses: Normal pulses.     Heart sounds: Normal heart sounds.  Pulmonary:     Effort: Pulmonary effort is normal.     Breath sounds: Normal breath sounds.  Abdominal:     General: Bowel sounds are normal.     Palpations: Abdomen is soft.  Musculoskeletal:        General: No swelling. Normal range of motion.     Cervical back: Normal range of motion.  Skin:    General: Skin is warm and dry.  Neurological:     General: No focal deficit present.     Mental Status: He is alert and oriented to person, place, and time.  Psychiatric:        Mood and Affect: Mood normal.        Behavior: Behavior normal.        Thought Content: Thought content normal.        Judgment: Judgment normal.     Labs reviewed: Basic Metabolic Panel: No results for input(s): "NA", "K", "CL", "CO2", "GLUCOSE", "BUN", "CREATININE", "CALCIUM", "MG", "PHOS", "TSH" in the last 8760 hours. Liver Function Tests: No results for input(s): "AST", "ALT", "ALKPHOS", "BILITOT", "PROT", "ALBUMIN" in the last 8760 hours. No results for input(s): "LIPASE", "AMYLASE" in the last 8760 hours. No results for input(s): "AMMONIA" in the last 8760 hours. CBC: No results for input(s): "WBC", "NEUTROABS", "HGB", "HCT", "MCV", "PLT" in the last 8760 hours. Lipid Panel: No results for input(s): "CHOL", "HDL", "LDLCALC", "TRIG", "CHOLHDL", "LDLDIRECT" in the last 8760 hours. Lab Results  Component Value Date   HGBA1C 5.9 (H) 01/19/2022    Procedures since last visit: No results found.  Assessment/Plan     Labs/tests ordered:    Next appt:  02/24/2023

## 2023-02-15 ENCOUNTER — Ambulatory Visit (HOSPITAL_COMMUNITY)
Admission: RE | Admit: 2023-02-15 | Discharge: 2023-02-15 | Disposition: A | Discharge status: Home / Self Care | Payer: Medicare Other | Source: Ambulatory Visit | Attending: Pulmonary Disease | Admitting: Pulmonary Disease

## 2023-02-15 ENCOUNTER — Ambulatory Visit (HOSPITAL_COMMUNITY)
Admission: RE | Admit: 2023-02-15 | Discharge: 2023-02-15 | Disposition: A | Discharge status: Home / Self Care | Payer: Medicare Other | Source: Ambulatory Visit | Attending: Urology | Admitting: Urology

## 2023-02-15 DIAGNOSIS — N289 Disorder of kidney and ureter, unspecified: Secondary | ICD-10-CM | POA: Diagnosis not present

## 2023-02-15 DIAGNOSIS — J479 Bronchiectasis, uncomplicated: Secondary | ICD-10-CM | POA: Diagnosis not present

## 2023-02-15 DIAGNOSIS — R918 Other nonspecific abnormal finding of lung field: Secondary | ICD-10-CM | POA: Insufficient documentation

## 2023-02-15 DIAGNOSIS — N281 Cyst of kidney, acquired: Secondary | ICD-10-CM | POA: Diagnosis not present

## 2023-02-15 DIAGNOSIS — D49512 Neoplasm of unspecified behavior of left kidney: Secondary | ICD-10-CM

## 2023-02-15 MED ORDER — GADOBUTROL 1 MMOL/ML IV SOLN
7.0000 mL | Freq: Once | INTRAVENOUS | Status: AC | PRN
  Administered 2023-02-15: 7 mL via INTRAVENOUS

## 2023-02-18 ENCOUNTER — Encounter: Payer: Self-pay | Admitting: Internal Medicine

## 2023-02-21 ENCOUNTER — Other Ambulatory Visit (HOSPITAL_COMMUNITY): Payer: Medicare Other

## 2023-02-22 ENCOUNTER — Telehealth: Payer: Self-pay | Admitting: Internal Medicine

## 2023-02-22 DIAGNOSIS — M7631 Iliotibial band syndrome, right leg: Secondary | ICD-10-CM | POA: Diagnosis not present

## 2023-02-22 NOTE — Telephone Encounter (Signed)
Patient called stating the CT scan that he had done on 02/15/2023 states it was compared to the CT done on 03/04/2022 instead of the CT scan that he done on 07/07/2022. Patient is uncertain why the most recent scan was compared to the one a year ago and not 07/07/2022. Patient wants to know if this is an issue before he comes into the office tomorrow for his visit with Dr. Celine Mans.

## 2023-02-23 ENCOUNTER — Ambulatory Visit (INDEPENDENT_AMBULATORY_CARE_PROVIDER_SITE_OTHER): Payer: Medicare Other | Admitting: Internal Medicine

## 2023-02-23 ENCOUNTER — Encounter: Payer: Self-pay | Admitting: Internal Medicine

## 2023-02-23 VITALS — BP 116/60 | HR 50 | Ht 69.0 in | Wt 172.4 lb

## 2023-02-23 DIAGNOSIS — I493 Ventricular premature depolarization: Secondary | ICD-10-CM

## 2023-02-23 DIAGNOSIS — A310 Pulmonary mycobacterial infection: Secondary | ICD-10-CM

## 2023-02-23 DIAGNOSIS — Z23 Encounter for immunization: Secondary | ICD-10-CM | POA: Diagnosis not present

## 2023-02-23 DIAGNOSIS — R001 Bradycardia, unspecified: Secondary | ICD-10-CM

## 2023-02-23 DIAGNOSIS — J479 Bronchiectasis, uncomplicated: Secondary | ICD-10-CM

## 2023-02-23 NOTE — Telephone Encounter (Signed)
Pt has a appointment today with Dr.Desai, question can be asked at this appointment. Send fyi to Dr.Desai

## 2023-02-23 NOTE — Addendum Note (Signed)
Addended by: Carnella Guadalajara on: 02/23/2023 04:52 PM   Modules accepted: Orders

## 2023-02-23 NOTE — Patient Instructions (Addendum)
It was a pleasure to see you today!  Please schedule follow up scheduled with myself in 6 months.  If my schedule is not open yet, we will contact you with a reminder closer to that time. Please call [phone] if you haven't heard from [place] a month before, and always call us sooner if issues or concerns arise. You can also send [place] a message through MyChart, but but aware that this is not to be used for urgent issues and it may take up to 5-7 days to receive a reply. Please be aware that you will likely be able to view your results before I have a chance to respond to them. Please give [place] 5 business days to respond to any non-urgent results.   It was a pleasure to see you today!  If you're able to bring up sputum to put in a cup let me know and we can send it for culture.  Otherwise it would require a bronchoscopy procedure.   I would advocate for watchful waiting with scans and monitoring your symptoms at this time before jumping into procedures and prolonged courses of antibiotics. Tell me if you have a change in your respiratory symptoms.   Please bring me a sputum sample so we can test it for AFB to see if the MAI infection is in there.   Please get blood work today to test for immunoglobulins to complete your work up for bronchiectasis.   Please follow up with the cardiologists about your extra heart beats and coronary artery disease.

## 2023-02-23 NOTE — Progress Notes (Signed)
Joseph Marsh    [id number]    02/03/1946  Primary Care Physician:Veludandi, Elvis Coil, MD Date of Appointment: 02/23/2023 Established Patient Visit  Chief complaint:   Chief Complaint  Patient presents with   Follow-up    Pt is here for F/U visit    HPI: Joseph Marsh is a 77 y.o. gentleman here for new patient evaluation of abnormal CT Chest and subsequent PET scan. History of prostate cancer treated in 2012 with radiation and brachytherapy. Had an MRI done for follow up.  Part of his surveillance for prostate cancer showed a kidney lesion which is being followed. There were some changes on his lungs that were concerning which are being followed by CT Chest and then dedicated PET scan. This led to discovery of pulmonary nodules. Notes several episodes of pneumonia, most recently in December 2020, treated with outpatient antibiotics.   Interval Updates: Here for follow up. Since his last visit his sputum culture grew normal flora as well as AFB positive for MAI infection. A second sputum sample has been ordered.   Had covid 3 weeks ago as well as influenza A. Treated with paxlovid and tamiflu. He is now feeling better.   Here for follow up after CT scan 3 month follow up. No fevers, chills, night sweats weight loss. No significant sputum production. Has chronic nasal drainage.  Feels well from a dyspnea standpoint, can do everything he wants to do.   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  Smoking history: never smoker, passive smoke exposure in childhood.   I have reviewed the patient's family social and past medical history and updated as appropriate.   Past Medical History:  Diagnosis Date   Basal cell carcinoma (BCC)    BPH (benign prostatic hyperplasia)    Cellulitis    Eczema    History of arthroscopic knee surgery    Hyperlipidemia     Kidney stone    Plantar fasciitis    Prostate cancer (HCC)    Right shoulder pain    Sinusitis    Spinal stenosis    Squamous cell carcinoma in situ     Past Surgical History:  Procedure Laterality Date   CHOLECYSTECTOMY N/A 05/24/2013   Procedure: LAPAROSCOPIC CHOLECYSTECTOMY;  Surgeon: Mariella Saa, MD;  Location: [facility];  Service: General;  Laterality: N/A;   COLONOSCOPY  1996   polp removal   KNEE SURGERY     LITHOTRIPSY     MRI     sinus cauterization  1970   TONSILLECTOMY      Family History  Problem Relation Age of Onset   Other Mother        Organ Failure, no specified per [facility] new patient packet    Heart disease Father        died from. Cerebral Hemorrage, smoker and ETOH    Osteoporosis Sister    Diverticulitis Sister    Thyroid disease Sister    Breast cancer Sister    Diverticulitis Sister    Melanoma Brother    Depression Son    Alcoholism Son    Pneumonia Nephew    Asthma Nephew    Colon cancer Neg Hx    Esophageal cancer Neg Hx    Stomach cancer Neg Hx    Pancreatic cancer Neg Hx     Social History   Occupational History  Employer: [employer]  Tobacco Use   Smoking status: Never   Smokeless tobacco: Never  Vaping Use   Vaping status: Never Used  Substance and Sexual Activity   Alcohol use: No   Drug use: No   Sexual activity: Yes     Physical Exam: Blood pressure 116/60, pulse (!) 50, height 5\' 9"  (1.753 m), weight 172 lb 6.4 oz (78.2 kg), SpO2 96%.  Gen:      No acute distress Lungs:    No increased respiratory effort, symmetric chest wall excursion, clear to auscultation bilaterally, no wheezes or crackles CV:         irregular, HR in 50s   Data Reviewed: Imaging: CT Chest reviewed 02/14/2022 - moderate bronchiectasis - multiple bilateral pulmonary nodules waxing and waning, consistent with atypical infection.   Labs: Lab Results  Component Value Date   NA 139 01/12/2021   K 3.9 01/12/2021   CO2 26 01/12/2021    GLUCOSE 116 (H) 01/12/2021   BUN 20 01/12/2021   CREATININE 0.86 01/12/2021   CALCIUM 8.8 01/12/2021   EGFR 90 01/12/2021   GFRNONAA 77 01/03/2020   Lab Results  Component Value Date   WBC 6.0 01/03/2020   HGB 15.7 01/03/2020   HCT 48.0 01/03/2020   MCV 88.2 01/03/2020   PLT 205 01/03/2020     Immunization status: Immunization History  Administered Date(s) Administered   Fluad Quad(high Dose 65+) 03/12/2020   Influenza, High Dose Seasonal PF 02/15/2019, 04/15/2022   Influenza-Unspecified 04/16/2013, 06/24/2014, 06/19/2015, 06/28/2016, 04/14/2017, 03/14/2018, 05/19/2018   Moderna Covid-19 Vaccine Bivalent Booster 55yrs & up 04/15/2022   PFIZER(Purple Top)SARS-COV-2 Vaccination 07/20/2019, 08/15/2019, 04/11/2020, 11/21/2020   Pneumococcal Conjugate-13 07/23/2014   Pneumococcal Polysaccharide-23 12/18/2009   Pneumococcal-Unspecified 06/14/2016   Tdap 03/01/2011, 03/09/2022   Zoster Recombinant(Shingrix) 06/30/2018, 09/05/2018   Zoster, Live 03/01/2011    External Records Personally Reviewed: pulmonary, pcp  Assessment:  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  Otherwise it would require a bronchoscopy procedure.   I would advocate for watchful waiting with scans and monitoring your symptoms at this time before jumping into procedures and prolonged courses of antibiotics. Tell me if you have a change in your respiratory symptoms.   Please bring me a sputum sample so we can test it for AFB to see if the MAI infection is in there.   Please get blood work today to test for immunoglobulins to complete your work up for bronchiectasis.    Please follow up with the cardiologists about your extra heart beats and coronary artery disease.   Flu shot today.   I spent 30 minutes in the care of this patient today including pre-charting, chart review, review of results, face-to-face care, coordination of care and communication with consultants etc.).   Return to Care: Return in about 6 months (around 08/23/2023).   Durel Salts, MD Pulmonary and Critical Care Medicine [facility] Office:[phone]

## 2023-02-24 ENCOUNTER — Other Ambulatory Visit: Payer: Medicare Other

## 2023-02-24 DIAGNOSIS — Z5181 Encounter for therapeutic drug level monitoring: Secondary | ICD-10-CM | POA: Diagnosis not present

## 2023-02-24 DIAGNOSIS — E7800 Pure hypercholesterolemia, unspecified: Secondary | ICD-10-CM

## 2023-02-24 DIAGNOSIS — M48061 Spinal stenosis, lumbar region without neurogenic claudication: Secondary | ICD-10-CM | POA: Diagnosis not present

## 2023-02-24 DIAGNOSIS — M722 Plantar fascial fibromatosis: Secondary | ICD-10-CM

## 2023-02-24 DIAGNOSIS — R7303 Prediabetes: Secondary | ICD-10-CM | POA: Diagnosis not present

## 2023-02-24 DIAGNOSIS — R739 Hyperglycemia, unspecified: Secondary | ICD-10-CM | POA: Diagnosis not present

## 2023-02-24 DIAGNOSIS — K862 Cyst of pancreas: Secondary | ICD-10-CM | POA: Diagnosis not present

## 2023-02-24 DIAGNOSIS — K8000 Calculus of gallbladder with acute cholecystitis without obstruction: Secondary | ICD-10-CM | POA: Diagnosis not present

## 2023-02-25 ENCOUNTER — Telehealth: Payer: Self-pay | Admitting: Internal Medicine

## 2023-02-25 DIAGNOSIS — I499 Cardiac arrhythmia, unspecified: Secondary | ICD-10-CM

## 2023-02-25 DIAGNOSIS — I2510 Atherosclerotic heart disease of native coronary artery without angina pectoris: Secondary | ICD-10-CM

## 2023-02-25 LAB — LIPID PANEL
Cholesterol: 163 mg/dL (ref <200)
HDL: 39 mg/dL — ABNORMAL LOW (ref >=40)
LDL Cholesterol (Calc): 99 mg/dL
Non-HDL Cholesterol (Calc): 124 mg/dL (ref <130)
Total CHOL/HDL Ratio: 4.2 (calc) (ref <5.0)
Triglycerides: 146 mg/dL (ref <150)

## 2023-02-25 LAB — COMPLETE METABOLIC PANEL WITH GFR
AG Ratio: 1.7 (calc) (ref 1.0–2.5)
ALT: 20 U/L (ref 9–46)
AST: 16 U/L (ref 10–35)
Albumin: 4.1 g/dL (ref 3.6–5.1)
Alkaline phosphatase (APISO): 100 U/L (ref 35–144)
BUN: 17 mg/dL (ref 7–25)
CO2: 28 mmol/L (ref 20–32)
Calcium: 8.9 mg/dL (ref 8.6–10.3)
Chloride: 103 mmol/L (ref 98–110)
Creat: 0.88 mg/dL (ref 0.70–1.28)
Globulin: 2.4 g/dL (ref 1.9–3.7)
Glucose, Bld: 119 mg/dL — ABNORMAL HIGH (ref 65–99)
Potassium: 4.2 mmol/L (ref 3.5–5.3)
Sodium: 139 mmol/L (ref 135–146)
Total Bilirubin: 0.4 mg/dL (ref 0.2–1.2)
Total Protein: 6.5 g/dL (ref 6.1–8.1)
eGFR: 87 mL/min/{1.73_m2} (ref >=60)

## 2023-02-25 LAB — CBC WITH DIFFERENTIAL/PLATELET
Absolute Monocytes: 482 {cells}/uL (ref 200–950)
Basophils Absolute: 30 {cells}/uL (ref 0–200)
Basophils Relative: 0.7 %
Eosinophils Absolute: 90 {cells}/uL (ref 15–500)
Eosinophils Relative: 2.1 %
HCT: 43.9 % (ref 38.5–50.0)
Hemoglobin: 14.4 g/dL (ref 13.2–17.1)
Lymphs Abs: 1062 {cells}/uL (ref 850–3900)
MCH: 29 pg (ref 27.0–33.0)
MCHC: 32.8 g/dL (ref 32.0–36.0)
MCV: 88.3 fL (ref 80.0–100.0)
MPV: 9.9 fL (ref 7.5–12.5)
Monocytes Relative: 11.2 %
Neutro Abs: 2636 {cells}/uL (ref 1500–7800)
Neutrophils Relative %: 61.3 %
Platelets: 197 10*3/uL (ref 140–400)
RBC: 4.97 10*6/uL (ref 4.20–5.80)
RDW: 13.1 % (ref 11.0–15.0)
Total Lymphocyte: 24.7 %
WBC: 4.3 10*3/uL (ref 3.8–10.8)

## 2023-02-25 LAB — VITAMIN B12: Vitamin B-12: 878 pg/mL (ref 200–1100)

## 2023-02-25 LAB — TSH: TSH: 7.05 m[IU]/L — ABNORMAL HIGH (ref 0.40–4.50)

## 2023-02-25 NOTE — Telephone Encounter (Signed)
Dr. Celine Mans, please advise on the message from pt.

## 2023-02-25 NOTE — Telephone Encounter (Signed)
PT called and states on last OV w/Dr. Celine Mans she put this on the AVS:  Please follow up with the cardiologists about your extra heart beats and coronary artery disease.   He looked on line and thinks the following Dept with [facility] would suit his needs:   Heart Failure [facility] Fax [phone]  They said he would need a referral fax'd in in order for him to make an appointment. Please call PT to advise action taken. His # is [phone]

## 2023-02-26 LAB — IMMUNOGLOBULINS A/E/G/M, SERUM
IgA/Immunoglobulin A, Serum: 262 mg/dL (ref 61–437)
IgE (Immunoglobulin E), Serum: 32 [IU]/mL (ref 6–495)
IgG (Immunoglobin G), Serum: 989 mg/dL (ref 603–1613)
IgM (Immunoglobulin M), Srm: 46 mg/dL (ref 15–143)

## 2023-03-01 ENCOUNTER — Ambulatory Visit (INDEPENDENT_AMBULATORY_CARE_PROVIDER_SITE_OTHER): Payer: Medicare Other | Admitting: Sports Medicine

## 2023-03-01 VITALS — BP 120/60 | HR 66 | Temp 97.6°F | Resp 16 | Ht 69.0 in | Wt 172.4 lb

## 2023-03-01 DIAGNOSIS — R918 Other nonspecific abnormal finding of lung field: Secondary | ICD-10-CM

## 2023-03-01 DIAGNOSIS — E785 Hyperlipidemia, unspecified: Secondary | ICD-10-CM | POA: Diagnosis not present

## 2023-03-01 DIAGNOSIS — E038 Other specified hypothyroidism: Secondary | ICD-10-CM

## 2023-03-01 MED ORDER — ATORVASTATIN CALCIUM 10 MG PO TABS: 10.0000 mg | ORAL_TABLET | Freq: Every day | ORAL | 3 refills | Status: DC

## 2023-03-01 NOTE — Progress Notes (Signed)
Careteam: Patient Care Team: Venita Sheffield, MD as PCP - General (Internal Medicine) Ollen Gross, MD as Consulting Physician (Orthopedic Surgery) Donzetta Starch, MD as Consulting Physician (Dermatology) Heloise Purpura, MD as Consulting Physician (Urology)  PLACE OF SERVICE:  [facility]  Advanced Directive information Does Patient Have a Medical Advance Directive?: No, Would patient like information on creating a medical advance directive?: No - Patient declined  No Known Allergies  Chief Complaint  Patient presents with   Annual Exam    PHYSICAL     HPI: Patient is a 77 y.o. 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  Has appt  next week   MAI  Had cough when he had covid but doing much better now Denies exertional SOB Saw pulm last week  Waiting to send sputum sample for AFB  Denies fevers, night sweats, chills Reports good appetite   HLD Lipid Panel     Component Value Date/Time   CHOL 163 02/24/2023 0807   TRIG 146 02/24/2023 0807   HDL 39 (L) 02/24/2023 0807   CHOLHDL 4.2 02/24/2023 0807   VLDL 62 (H) 09/11/2009 0137   LDLCALC 99 02/24/2023 0807     The 10-year ASCVD risk score (Arnett DK, et al., 2019) is: 28.7%   Values used to calculate the score:     Age: 52 years     Sex: Male     Is Non-Hispanic African American: No     Diabetic: No     Tobacco smoker: No     Systolic Blood Pressure: 120 mmHg     Is BP treated: No     HDL Cholesterol: 39 mg/dL     Total Cholesterol: 163 mg/dL   Review of Systems:  Review of Systems  Constitutional:  Negative for chills and fever.  HENT:  Negative for congestion and sore throat.   Eyes:  Negative for double vision.  Respiratory:  Negative for cough, sputum production and shortness of breath.   Cardiovascular:   Negative for chest pain, palpitations and leg swelling.  Gastrointestinal:  Negative for abdominal pain, heartburn and nausea.  Genitourinary:  Negative for dysuria, frequency and hematuria.  Musculoskeletal:  Negative for falls and myalgias.  Neurological:  Negative for dizziness, sensory change and focal weakness.     Past Medical History:  Diagnosis Date   Basal cell carcinoma (BCC)    BPH (benign prostatic hyperplasia)    Cellulitis    Eczema    History of arthroscopic knee surgery    Hyperlipidemia    Kidney stone    Plantar fasciitis    Prostate cancer (HCC)    Right shoulder pain    Sinusitis    Spinal stenosis    Squamous cell carcinoma in situ    Past Surgical History:  Procedure Laterality Date   CHOLECYSTECTOMY N/A 05/24/2013   Procedure: LAPAROSCOPIC CHOLECYSTECTOMY;  Surgeon: Mariella Saa, MD;  Location: [facility];  Service: General;  Laterality: N/A;   COLONOSCOPY  1996   polp removal   KNEE SURGERY     LITHOTRIPSY     MRI     sinus cauterization  1970   TONSILLECTOMY     Social History:   reports that he has never smoked. He has never  used smokeless tobacco. He reports that he does not drink alcohol and does not use drugs.  Family History  Problem Relation Age of Onset   Other Mother        Organ Failure, no specified per [facility] new patient packet    Heart disease Father        died from. Cerebral Hemorrage, smoker and ETOH    Osteoporosis Sister    Diverticulitis Sister    Thyroid disease Sister    Breast cancer Sister    Diverticulitis Sister    Melanoma Brother    Depression Son    Alcoholism Son    Pneumonia Nephew    Asthma Nephew    Colon cancer Neg Hx    Esophageal cancer Neg Hx    Stomach cancer Neg Hx    Pancreatic cancer Neg Hx     Medications: Patient's Medications  New Prescriptions   ATORVASTATIN (LIPITOR) 10 MG TABLET    Take 1 tablet (10 mg total) by mouth daily.  Previous Medications   ACETAMINOPHEN (TYLENOL) 500 MG  TABLET    Take 500 mg by mouth as needed.   ASCORBIC ACID (VITAMIN C) 1000 MG TABLET    Take 1,000 mg by mouth daily.    BIOTIN PO    Take 1,000 mcg by mouth daily.    CHOLECALCIFEROL (VITAMIN D-3 PO)    Take 2,000 Units by mouth daily. 2   COENZYME Q10 (CO Q 10) 100 MG CAPS    Take 300 mg by mouth daily.   CYANOCOBALAMIN (VITAMIN B 12 PO)    Take 2,500 mcg by mouth.   FINASTERIDE (PROSCAR) 5 MG TABLET    Take 5 mg by mouth daily.   FLUOCINONIDE-EMOLLIENT (LIDEX-E) 0.05 % CREAM    Apply 1 application  topically as needed.   GLUCOS-CHONDROIT-COLLAG-HYAL (GLUCOSAMINE CHONDROIT-COLLAGEN PO)    Take 1 capsule by mouth 2 (two) times daily.    MELATONIN PO    Take 10 mg by mouth at bedtime.   MULTIPLE VITAMIN (MULTIVITAMIN) TABLET    Take 1 tablet by mouth daily.   OMEGA-3 FATTY ACIDS (FISH OIL PO)    Take 1,950 mg by mouth daily.   TAMSULOSIN (FLOMAX) 0.4 MG CAPS CAPSULE    Take 0.4 mg by mouth daily. Prescribed by urologist   TURMERIC CURCUMIN 500 MG CAPS    Take by mouth daily.  Modified Medications   No medications on file  Discontinued Medications   OMEGA-3 FATTY ACIDS (FISH OIL) 300 MG CAPS    Take by mouth.    Physical Exam:  Vitals:   03/01/23 1053  BP: 120/60  Pulse: 66  Resp: 16  Temp: 97.6 F (36.4 C)  SpO2: 98%  Weight: 172 lb 6.4 oz (78.2 kg)  Height: 5\' 9"  (1.753 m)   Body mass index is 25.46 kg/m. Wt Readings from Last 3 Encounters:  03/01/23 172 lb 6.4 oz (78.2 kg)  02/23/23 172 lb 6.4 oz (78.2 kg)  02/11/23 171 lb (77.6 kg)    Physical Exam Constitutional:      Appearance: Normal appearance.  HENT:     Head: Normocephalic and atraumatic.  Cardiovascular:     Rate and Rhythm: Normal rate and regular rhythm.     Pulses: Normal pulses.     Heart sounds: Normal heart sounds.  Pulmonary:     Effort: No respiratory distress.     Breath sounds: No stridor. No wheezing or rales.  Abdominal:     General:  Bowel sounds are normal. There is no distension.      Palpations: Abdomen is soft.     Tenderness: There is no abdominal tenderness. There is no right CVA tenderness or guarding.  Musculoskeletal:        General: No swelling.  Neurological:     Mental Status: He is alert. Mental status is at baseline.     Sensory: No sensory deficit.     Motor: No weakness.      Labs reviewed: Basic Metabolic Panel: Recent Labs    02/24/23 0807  NA 139  K 4.2  CL 103  CO2 28  GLUCOSE 119*  BUN 17  CREATININE 0.88  CALCIUM 8.9  TSH 7.05*   Liver Function Tests: Recent Labs    02/24/23 0807  AST 16  ALT 20  BILITOT 0.4  PROT 6.5   No results for input(s): "LIPASE", "AMYLASE" in the last 8760 hours. No results for input(s): "AMMONIA" in the last 8760 hours. CBC: Recent Labs    02/24/23 0807  WBC 4.3  NEUTROABS 2,636  HGB 14.4  HCT 43.9  MCV 88.3  PLT 197   Lipid Panel: Recent Labs    02/24/23 0807  CHOL 163  HDL 39*  LDLCALC 99  TRIG 638  CHOLHDL 4.2   TSH: Recent Labs    02/24/23 0807  TSH 7.05*   A1C: Lab Results  Component Value Date   HGBA1C 5.9 (H) 01/19/2022   The 10-year ASCVD risk score (Arnett DK, et al., 2019) is: 28.7%   Values used to calculate the score:     Age: 39 years     Sex: Male     Is Non-Hispanic African American: No     Diabetic: No     Tobacco smoker: No     Systolic Blood Pressure: 120 mmHg     Is BP treated: No     HDL Cholesterol: 39 mg/dL     Total Cholesterol: 163 mg/dL   Assessment/Plan  1. Hyperlipidemia, unspecified hyperlipidemia type  Informed patient about the side effects of the medication  - atorvastatin (LIPITOR) 10 MG tablet; Take 1 tablet (10 mg total) by mouth daily.  Dispense: 90 tablet; Refill: 3  2. Subclinical hypothyroidism Will repeat TSH at next visit  3. Lung nodules  Instructed to follow up with pulmonology  Other orders - Omega-3 Fatty Acids (FISH OIL PO); Take 1,950 mg by mouth daily.   Return in about 2 months (around 05/01/2023).:

## 2023-03-02 DIAGNOSIS — M7631 Iliotibial band syndrome, right leg: Secondary | ICD-10-CM | POA: Diagnosis not present

## 2023-03-02 DIAGNOSIS — Z8546 Personal history of malignant neoplasm of prostate: Secondary | ICD-10-CM | POA: Diagnosis not present

## 2023-03-02 DIAGNOSIS — C61 Malignant neoplasm of prostate: Secondary | ICD-10-CM | POA: Diagnosis not present

## 2023-03-08 ENCOUNTER — Ambulatory Visit (INDEPENDENT_AMBULATORY_CARE_PROVIDER_SITE_OTHER): Payer: Medicare Other | Admitting: Family

## 2023-03-08 ENCOUNTER — Encounter: Payer: Self-pay | Admitting: Orthopedic Surgery

## 2023-03-08 VITALS — BP 128/76 | HR 75 | Temp 97.3°F | Resp 16 | Ht 69.0 in | Wt 170.6 lb

## 2023-03-08 DIAGNOSIS — M7631 Iliotibial band syndrome, right leg: Secondary | ICD-10-CM | POA: Diagnosis not present

## 2023-03-08 DIAGNOSIS — Z0000 Encounter for general adult medical examination without abnormal findings: Secondary | ICD-10-CM | POA: Diagnosis not present

## 2023-03-08 NOTE — Patient Instructions (Signed)
  Joseph Marsh , Thank you for taking time to come for your Medicare Wellness Visit. I appreciate your ongoing commitment to your health goals. Please review the following plan we discussed and let me know if I can assist you in the future.   These are the goals we discussed:  Goals      Exercise 3x per week (30 min per time)     More exercise year round     Patient Stated     To deal with recently increase increase in side effects from prior prostate cancer.         This is a list of the screening recommended for you and due dates:  Health Maintenance  Topic Date Due   COVID-19 Vaccine (6 - 2023-24 season) 02/13/2023   Medicare Annual Wellness Visit  03/07/2024   DTaP/Tdap/Td vaccine (3 - Td or Tdap) 03/09/2032   Pneumonia Vaccine  Completed   Flu Shot  Completed   Hepatitis C Screening  Completed   Zoster (Shingles) Vaccine  Completed   HPV Vaccine  Aged Out   Colon Cancer Screening  Discontinued

## 2023-03-08 NOTE — Progress Notes (Addendum)
Subjective:   Joseph Marsh is a 77 y.o. male who presents for Medicare Annual/Subsequent preventive examination.  Visit Complete: In person  Patient Medicare AWV questionnaire was completed by the patient on 03/08/2023; I have confirmed that all information answered by patient is correct and no changes since this date.  Cardiac Risk Factors include: advanced age (>39men, >21 women);dyslipidemia;male gender     Objective:    Today's Vitals   03/08/23 1556  BP: 128/76  Pulse: 75  Resp: 16  Temp: (!) 97.3 F (36.3 C)  SpO2: 96%  Weight: 170 lb 9.6 oz (77.4 kg)  Height: 5\' 9"  (1.753 m)   Body mass index is 25.19 kg/m.     03/08/2023    3:53 PM 03/01/2023   10:55 AM 02/11/2023    2:07 PM 03/04/2022   10:12 AM 02/26/2021    2:37 PM 02/25/2020    2:56 PM 01/14/2020    9:41 AM  Advanced Directives  Does Patient Have a Medical Advance Directive? No No No No No No No  Would patient like information on creating a medical advance directive?  No - Patient declined No - Patient declined   No - Patient declined     Current Medications (verified) Outpatient Encounter Medications as of 03/08/2023  Medication Sig   acetaminophen (TYLENOL) 500 MG tablet Take 500 mg by mouth as needed.   Ascorbic Acid (VITAMIN C) 1000 MG tablet Take 1,000 mg by mouth daily.    atorvastatin (LIPITOR) 10 MG tablet Take 1 tablet (10 mg total) by mouth daily.   BIOTIN PO Take 1,000 mcg by mouth daily.    Cholecalciferol (VITAMIN D-3 PO) Take 2,000 Units by mouth daily. 2   Coenzyme Q10 (CO Q 10) 100 MG CAPS Take 300 mg by mouth daily.   Cyanocobalamin (VITAMIN B 12 PO) Take 2,500 mcg by mouth.   finasteride (PROSCAR) 5 MG tablet Take 5 mg by mouth daily.   fluocinonide-emollient (LIDEX-E) 0.05 % cream Apply 1 application  topically as needed.   Glucos-Chondroit-Collag-Hyal (GLUCOSAMINE CHONDROIT-COLLAGEN PO) Take 1 capsule by mouth 2 (two) times daily.    MELATONIN PO Take 10 mg by mouth at bedtime.    Multiple Vitamin (MULTIVITAMIN) tablet Take 1 tablet by mouth daily.   Omega-3 Fatty Acids (FISH OIL PO) Take 1,950 mg by mouth daily.   tamsulosin (FLOMAX) 0.4 MG CAPS capsule Take 0.4 mg by mouth daily. Prescribed by urologist   Turmeric Curcumin 500 MG CAPS Take by mouth daily.   No facility-administered encounter medications on file as of 03/08/2023.    Allergies (verified) Patient has no known allergies.   History: Past Medical History:  Diagnosis Date   Basal cell carcinoma (BCC)    BPH (benign prostatic hyperplasia)    Cellulitis    Eczema    History of arthroscopic knee surgery    Hyperlipidemia    Kidney stone    Plantar fasciitis    Prostate cancer (HCC)    Right shoulder pain    Sinusitis    Spinal stenosis    Squamous cell carcinoma in situ    Past Surgical History:  Procedure Laterality Date   CHOLECYSTECTOMY N/A 05/24/2013   Procedure: LAPAROSCOPIC CHOLECYSTECTOMY;  Surgeon: Mariella Saa, MD;  Location: [facility];  Service: General;  Laterality: N/A;   COLONOSCOPY  1996   polp removal   KNEE SURGERY     LITHOTRIPSY     MRI     sinus cauterization  1970  TONSILLECTOMY     Family History  Problem Relation Age of Onset   Other Mother        Organ Failure, no specified per [facility] new patient packet    Heart disease Father        died from. Cerebral Hemorrage, smoker and ETOH    Osteoporosis Sister    Diverticulitis Sister    Thyroid disease Sister    Breast cancer Sister    Diverticulitis Sister    Melanoma Brother    Depression Son    Alcoholism Son    Pneumonia Nephew    Asthma Nephew    Colon cancer Neg Hx    Esophageal cancer Neg Hx    Stomach cancer Neg Hx    Pancreatic cancer Neg Hx    Social History   Socioeconomic History   Marital status: Married    Spouse name: Not on file   Number of children: 2   Years of education: Not on file   Highest education level: Master's degree (e.g., MA, MS, MEng, MEd, MSW, MBA)  Occupational  History    Employer: [employer]  Tobacco Use   Smoking status: Never   Smokeless tobacco: Never  Vaping Use   Vaping status: Never Used  Substance and Sexual Activity   Alcohol use: No   Drug use: No   Sexual activity: Yes  Other Topics Concern   Not on file  Social History Narrative   As of 06/19/2018 [facility] New Patient Packet:      Diet: Fruits, nuts, protein, carbohydrates, sodium, fats, veggies, and some seafood       Caffeine: Average 5 12oz sodas per week       Married, if yes what year: Divorced x 2, married 1970-1999 (1st time) and 2004-2016 (second time)       Do you live in a house, apartment, assisted living, condo, trailer, ect: 3 bed room house, 1 stories, one person       Pets: No      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 No   Do you have difficulty preparing food or eating? No   Do you have difficulty managing your medications? No   Do you have difficulty managing your finances? No   Do you have difficulty affording your medications? No   Social Determinants of Health   Financial Resource Strain: Low Risk  (03/08/2023)   Overall Financial Resource Strain (CARDIA)    Difficulty of Paying Living Expenses: Not hard at all  Food Insecurity: No Food Insecurity (03/08/2023)   Hunger Vital Sign    Worried About Running Out of Food in the Last Year: Never true    Ran Out of Food in the Last Year: Never true  Transportation Needs: No Transportation Needs (03/08/2023)   PRAPARE - Administrator, Civil Service (Medical): No    Lack of Transportation (Non-Medical): No  Physical Activity: Sufficiently Active (03/08/2023)   Exercise Vital Sign    Days of Exercise per Week: 2 days    Minutes of Exercise per Session:  150+ min  Stress: Stress Concern Present (03/08/2023)   [employer] of Occupational Health - Occupational Stress Questionnaire  Feeling of Stress : To some extent  Social Connections: Moderately Integrated (03/08/2023)   Social Connection and Isolation Panel [NHANES]    Frequency of Communication with Friends and Family: More than three times a week    Frequency of Social Gatherings with Friends and Family: Patient declined    Attends Religious Services: 1 to 4 times per year    Active Member of [employer] or Organizations: No    Attends Engineer, structural: Never    Marital Status: Married    Tobacco Counseling Counseling given: Not Answered   Clinical Intake:  Pre-visit preparation completed: No  Pain : No/denies pain     BMI - recorded: 25.19 Nutritional Status: BMI 25 -29 Overweight Nutritional Risks: None Diabetes: No  How often do you need to have someone help you when you read instructions, pamphlets, or other written materials from your doctor or pharmacy?: 1 - Never What is the last grade level you completed in school?: Masters degree  Interpreter Needed?: No      Activities of Daily Living    03/08/2023    4:11 PM  In your present state of health, do you have any difficulty performing the following activities:  Hearing? 0  Vision? 0  Difficulty concentrating or making decisions? 0  Walking or climbing stairs? 1  Dressing or bathing? 0  Doing errands, shopping? 0  Preparing Food and eating ? N  Using the Toilet? N  In the past six months, have you accidently leaked urine? Y  Do you have problems with loss of bowel control? N  Managing your Medications? N  Managing your Finances? N  Housekeeping or managing your Housekeeping? N    Patient Care Team: Venita Sheffield, MD as PCP - General (Internal Medicine) Ollen Gross, MD as Consulting Physician (Orthopedic Surgery) Donzetta Starch, MD as Consulting Physician (Dermatology) Heloise Purpura, MD as Consulting Physician (Urology)  Indicate any recent Medical Services you may have received from other than [facility] providers in the past year (date may be approximate).     Assessment:   This is a routine wellness examination for Johncharles.  Hearing/Vision screen No results found.   Goals Addressed             This Visit's Progress    Exercise 3x per week (30 min per time)   Not on track    More exercise year round     Patient Stated   On track    To deal with recently increase increase in side effects from prior prostate cancer.        Depression Screen    03/08/2023    3:55 PM 02/02/2023    3:29 PM 03/04/2022   11:00 AM 02/26/2021    2:34 PM 01/20/2021    3:15 PM 02/25/2020    2:55 PM 01/14/2020    9:41 AM  PHQ 2/9 Scores  PHQ - 2 Score 0 0 0 0 0 0 0  PHQ- 9 Score  0         Fall Risk    03/08/2023    3:54 PM 03/01/2023   10:55 AM 02/11/2023    2:06 PM 02/02/2023    3:30 PM 03/04/2022   11:08 AM  Fall Risk   Falls in the past year? 0 0 0 0 0  Number falls in past yr:  0 0 0 0  Injury with Fall?  0 0 0 0  Risk for fall due to :  No Fall Risks  No Fall Risks No Fall Risks No Fall Risks  Follow up  Falls evaluation completed;Education provided;Falls prevention discussed Falls evaluation completed Falls evaluation completed Falls evaluation completed    MEDICARE RISK AT HOME: Medicare Risk at Home Any stairs in or around the home?: Yes If so, are there any without handrails?: No Home free of loose throw rugs in walkways, pet beds, electrical cords, etc?: Yes Adequate lighting in your home to reduce risk of falls?: Yes Life alert?: No Use of a cane, walker or w/c?: No Grab bars in the bathroom?: No Shower chair or bench in shower?: No Elevated toilet seat or a handicapped toilet?: No  TIMED UP AND GO:  Was the test performed?  No    Cognitive Function:    03/08/2023    3:58 PM 10/30/2018    8:16 AM  MMSE - Mini Mental State Exam  Orientation to time  5 5  Orientation to Place 5 5  Registration 3 3  Attention/ Calculation 5 5  Recall 2 0  Language- name 2 objects 2 2  Language- repeat 1 1  Language- follow 3 step command 3 3  Language- read & follow direction 1 1  Write a sentence 1 1  Copy design 1 1  Total score 29 27        03/04/2022   11:09 AM 02/26/2021    2:38 PM 02/25/2020    2:57 PM  6CIT Screen  What Year? 0 points 0 points 0 points  What month? 0 points 0 points 0 points  What time? 0 points 0 points 0 points  Count back from 20 0 points 0 points 0 points  Months in reverse 0 points 0 points 0 points  Repeat phrase 0 points 0 points 0 points  Total Score 0 points 0 points 0 points    Immunizations Immunization History  Administered Date(s) Administered   Fluad Quad(high Dose 65+) 03/12/2020   Fluad Trivalent(High Dose 65+) 02/23/2023   Influenza, High Dose Seasonal PF 02/15/2019, 04/15/2022   Influenza-Unspecified 04/16/2013, 06/24/2014, 06/19/2015, 06/28/2016, 04/14/2017, 03/14/2018, 05/19/2018   Moderna Covid-19 Vaccine Bivalent Booster 20yrs & up 04/15/2022   PFIZER(Purple Top)SARS-COV-2 Vaccination 07/20/2019, 08/15/2019, 04/11/2020, 11/21/2020   Pneumococcal Conjugate-13 07/23/2014   Pneumococcal Polysaccharide-23 12/18/2009   Pneumococcal-Unspecified 06/14/2016   Tdap 03/01/2011, 03/09/2022   Zoster Recombinant(Shingrix) 06/30/2018, 09/05/2018   Zoster, Live 03/01/2011    TDAP status: Up to date  Flu Vaccine status: Up to date  Pneumococcal vaccine status: Up to date  Covid-19 vaccine status: Completed vaccines  Qualifies for Shingles Vaccine? Yes   Zostavax completed Yes   Shingrix Completed?: Yes  Screening Tests Health Maintenance  Topic Date Due   COVID-19 Vaccine (6 - 2023-24 season) 02/13/2023   Medicare Annual Wellness (AWV)  03/07/2024   DTaP/Tdap/Td (3 - Td or Tdap) 03/09/2032   Pneumonia Vaccine 3+ Years old  Completed   INFLUENZA VACCINE  Completed   Hepatitis C Screening   Completed   Zoster Vaccines- Shingrix  Completed   HPV VACCINES  Aged Out   Colonoscopy  Discontinued    Health Maintenance  Health Maintenance Due  Topic Date Due   COVID-19 Vaccine (6 - 2023-24 season) 02/13/2023    Colorectal cancer screening: No longer required.   Lung Cancer Screening: (Low Dose CT Chest recommended if Age 64-80 years, 20 pack-year currently smoking OR have quit w/in 15years.) does not qualify.   Lung Cancer Screening Referral: No  Additional Screening:  Hepatitis C Screening: does  not qualify; Completed   Vision Screening: Recommended annual ophthalmology exams for early detection of glaucoma and other disorders of the eye. Is the patient up to date with their annual eye exam?  Yes  Who is the provider or what is the name of the office in which the patient attends annual eye exams? Dr. Elmer Picker If pt is not established with a provider, would they like to be referred to a provider to establish care? No .   Dental Screening: Recommended annual dental exams for proper oral hygiene  Diabetic Foot Exam: Diabetic Foot Exam: Overdue, Pt has been advised about the importance in completing this exam. Pt is scheduled for diabetic foot exam on  .  Community Resource Referral / Chronic Care Management: CRR required this visit?  No   CCM required this visit?  No     Plan:     I have personally reviewed and noted the following in the patient's chart:   Medical and social history Use of alcohol, tobacco or illicit drugs  Current medications and supplements including opioid prescriptions. Patient is not currently taking opioid prescriptions. Functional ability and status Nutritional status Physical activity Advanced directives List of other physicians Hospitalizations, surgeries, and ER visits in previous 12 months Vitals Screenings to include cognitive, depression, and falls Referrals and appointments  In addition, I have reviewed and discussed with  patient certain preventive protocols, quality metrics, and best practice recommendations. A written personalized care plan for preventive services as well as general preventive health recommendations were provided to patient.     Octavia Heir, NP   03/08/2023   After Visit Summary: (MyChart) Due to this being a telephonic visit, the after visit summary with patients personalized plan was offered to patient via MyChart   Nurse Notes: MMSE 29/30, correct shapes and sentence

## 2023-03-09 DIAGNOSIS — N401 Enlarged prostate with lower urinary tract symptoms: Secondary | ICD-10-CM | POA: Diagnosis not present

## 2023-03-09 DIAGNOSIS — N3943 Post-void dribbling: Secondary | ICD-10-CM | POA: Diagnosis not present

## 2023-03-09 DIAGNOSIS — Z8546 Personal history of malignant neoplasm of prostate: Secondary | ICD-10-CM | POA: Diagnosis not present

## 2023-03-09 DIAGNOSIS — D49512 Neoplasm of unspecified behavior of left kidney: Secondary | ICD-10-CM | POA: Diagnosis not present

## 2023-03-11 ENCOUNTER — Other Ambulatory Visit: Payer: Self-pay | Admitting: Urology

## 2023-03-11 DIAGNOSIS — D49512 Neoplasm of unspecified behavior of left kidney: Secondary | ICD-10-CM

## 2023-03-17 DIAGNOSIS — M7631 Iliotibial band syndrome, right leg: Secondary | ICD-10-CM | POA: Diagnosis not present

## 2023-03-23 DIAGNOSIS — M7631 Iliotibial band syndrome, right leg: Secondary | ICD-10-CM | POA: Diagnosis not present

## 2023-03-23 DIAGNOSIS — M6281 Muscle weakness (generalized): Secondary | ICD-10-CM | POA: Diagnosis not present

## 2023-03-30 DIAGNOSIS — M6281 Muscle weakness (generalized): Secondary | ICD-10-CM | POA: Diagnosis not present

## 2023-03-30 DIAGNOSIS — M7631 Iliotibial band syndrome, right leg: Secondary | ICD-10-CM | POA: Diagnosis not present

## 2023-04-05 ENCOUNTER — Encounter: Payer: Self-pay | Admitting: Sports Medicine

## 2023-04-05 ENCOUNTER — Ambulatory Visit (INDEPENDENT_AMBULATORY_CARE_PROVIDER_SITE_OTHER): Payer: Medicare Other | Admitting: Sports Medicine

## 2023-04-05 VITALS — BP 127/75 | HR 67 | Temp 97.5°F | Resp 18 | Ht 69.0 in | Wt 173.2 lb

## 2023-04-05 DIAGNOSIS — F5101 Primary insomnia: Secondary | ICD-10-CM | POA: Diagnosis not present

## 2023-04-05 DIAGNOSIS — M545 Low back pain, unspecified: Secondary | ICD-10-CM | POA: Diagnosis not present

## 2023-04-05 DIAGNOSIS — M542 Cervicalgia: Secondary | ICD-10-CM

## 2023-04-05 MED ORDER — LIDOCAINE 4 % EX PTCH: 1.0000 | MEDICATED_PATCH | CUTANEOUS | 0 refills | Status: DC

## 2023-04-05 NOTE — Progress Notes (Signed)
Careteam: Patient Care Team: Venita Sheffield, MD as PCP - General (Internal Medicine) Ollen Gross, MD as Consulting Physician (Orthopedic Surgery) Donzetta Starch, MD as Consulting Physician (Dermatology) Heloise Purpura, MD as Consulting Physician (Urology) Charlott Holler, MD as Consulting Physician (Pulmonary Disease) Little Ishikawa, MD as Consulting Physician (Cardiology)  PLACE OF SERVICE:  [facility]  Advanced Directive information    No Known Allergies  Chief Complaint  Patient presents with   Acute Visit    neck and back pain need referral to PT      HPI: Patient is a 77 y.o. male  is here for acute visit for neck and back pain  Location-  Neck pain  Duration -since September Aggravating factors- worse with movements Got better for a while and started coming back since he started doing yard work Interfering with sleep  Reports 4/10  Relieving factors-  resting Medications tried- took tylenol Physical therapy- doing therapy  for hip  Falls-  none  Low back pain since last week  Mostly in the morning , worse with standing in a certain position  No radiation No weakness or numbness Denies urinary or fecal incontinence Pt following with therapy for  Rt hip pain   Insomnia Goes to bed around 11- 12 and wakes up at 7 in the morning States he wakes up in the middle of the night but is able to go back to sleep  Denies drinking excessive coffee    Review of Systems:  Review of Systems  Constitutional:  Negative for chills and fever.  HENT:  Negative for congestion and sore throat.   Eyes:  Negative for double vision.  Respiratory:  Negative for cough, sputum production and shortness of breath.   Cardiovascular:  Negative for chest pain, palpitations and leg swelling.  Gastrointestinal:  Negative for abdominal pain, heartburn and nausea.  Genitourinary:  Negative for dysuria, frequency and hematuria.  Musculoskeletal:  Positive for back pain  and neck pain. Negative for falls and myalgias.  Neurological:  Negative for dizziness, sensory change and focal weakness.    Past Medical History:  Diagnosis Date   Basal cell carcinoma (BCC)    BPH (benign prostatic hyperplasia)    Cellulitis    Eczema    History of arthroscopic knee surgery    Hyperlipidemia    Kidney stone    Plantar fasciitis    Prostate cancer (HCC) 2012   Right shoulder pain    Sinusitis    Spinal stenosis    Squamous cell carcinoma in situ    Past Surgical History:  Procedure Laterality Date   CHOLECYSTECTOMY N/A 05/24/2013   Procedure: LAPAROSCOPIC CHOLECYSTECTOMY;  Surgeon: Mariella Saa, MD;  Location: [facility];  Service: General;  Laterality: N/A;   COLONOSCOPY  1996   polp removal   COLONOSCOPY  08/2020   polpys removed   KNEE SURGERY     LITHOTRIPSY     MRI     sinus cauterization  1970   TONSILLECTOMY     Social History:   reports that he has never smoked. He has never used smokeless tobacco. He reports that he does not drink alcohol and does not use drugs.  Family History  Problem Relation Age of Onset   Other Mother        Organ Failure, no specified per [facility] new patient packet    Heart disease Father        died from. Cerebral Hemorrage, smoker and ETOH  Osteoporosis Sister    Diverticulitis Sister    Thyroid disease Sister    Breast cancer Sister    Diverticulitis Sister    Melanoma Brother    Depression Son    Alcoholism Son    Pneumonia Nephew    Asthma Nephew    Colon cancer Neg Hx    Esophageal cancer Neg Hx    Stomach cancer Neg Hx    Pancreatic cancer Neg Hx     Medications: Patient's Medications  New Prescriptions   No medications on file  Previous Medications   ACETAMINOPHEN (TYLENOL) 500 MG TABLET    Take 500 mg by mouth as needed.   ASCORBIC ACID (VITAMIN C) 1000 MG TABLET    Take 1,000 mg by mouth daily.    ATORVASTATIN (LIPITOR) 10 MG TABLET    Take 1 tablet (10 mg total) by mouth daily.   BIOTIN  PO    Take 1,000 mcg by mouth daily.    CHOLECALCIFEROL (VITAMIN D-3 PO)    Take 2,000 Units by mouth daily. 2   COENZYME Q10 (CO Q 10) 100 MG CAPS    Take 300 mg by mouth daily.   CYANOCOBALAMIN (VITAMIN B 12 PO)    Take 2,500 mcg by mouth.   FINASTERIDE (PROSCAR) 5 MG TABLET    Take 5 mg by mouth daily.   FLUOCINONIDE-EMOLLIENT (LIDEX-E) 0.05 % CREAM    Apply 1 application  topically as needed.   GLUCOS-CHONDROIT-COLLAG-HYAL (GLUCOSAMINE CHONDROIT-COLLAGEN PO)    Take 1 capsule by mouth 2 (two) times daily.    MELATONIN PO    Take 10 mg by mouth at bedtime.   MULTIPLE VITAMIN (MULTIVITAMIN) TABLET    Take 1 tablet by mouth daily.   OMEGA-3 FATTY ACIDS (FISH OIL PO)    Take 1,950 mg by mouth daily.   TAMSULOSIN (FLOMAX) 0.4 MG CAPS CAPSULE    Take 0.4 mg by mouth daily. Prescribed by urologist   TURMERIC CURCUMIN 500 MG CAPS    Take by mouth daily.  Modified Medications   No medications on file  Discontinued Medications   No medications on file    Physical Exam:  Vitals:   04/05/23 0848  BP: 127/75  Pulse: 67  Resp: 18  Temp: (!) 97.5 F (36.4 C)  SpO2: 95%  Weight: 173 lb 3.2 oz (78.6 kg)  Height: 5\' 9"  (1.753 m)   Body mass index is 25.58 kg/m. Wt Readings from Last 3 Encounters:  04/05/23 173 lb 3.2 oz (78.6 kg)  03/08/23 170 lb 9.6 oz (77.4 kg)  03/01/23 172 lb 6.4 oz (78.2 kg)    Physical Exam Constitutional:      Appearance: Normal appearance.  HENT:     Head: Normocephalic and atraumatic.  Cardiovascular:     Rate and Rhythm: Normal rate and regular rhythm.     Pulses: Normal pulses.     Heart sounds: Normal heart sounds.  Pulmonary:     Effort: No respiratory distress.     Breath sounds: No stridor. No wheezing or rales.  Abdominal:     General: Bowel sounds are normal. There is no distension.     Palpations: Abdomen is soft.     Tenderness: There is no abdominal tenderness. There is no right CVA tenderness or guarding.  Musculoskeletal:         General: No swelling.     Comments: Neck - no point tenderness  Rom limited due to pain    Neurological:  Mental Status: He is alert. Mental status is at baseline.     Sensory: No sensory deficit.     Motor: No weakness.     Labs reviewed: Basic Metabolic Panel: Recent Labs    02/24/23 0807  NA 139  K 4.2  CL 103  CO2 28  GLUCOSE 119*  BUN 17  CREATININE 0.88  CALCIUM 8.9  TSH 7.05*   Liver Function Tests: Recent Labs    02/24/23 0807  AST 16  ALT 20  BILITOT 0.4  PROT 6.5   No results for input(s): "LIPASE", "AMYLASE" in the last 8760 hours. No results for input(s): "AMMONIA" in the last 8760 hours. CBC: Recent Labs    02/24/23 0807  WBC 4.3  NEUTROABS 2,636  HGB 14.4  HCT 43.9  MCV 88.3  PLT 197   Lipid Panel: Recent Labs    02/24/23 0807  CHOL 163  HDL 39*  LDLCALC 99  TRIG 540  CHOLHDL 4.2   TSH: Recent Labs    02/24/23 0807  TSH 7.05*   A1C: Lab Results  Component Value Date   HGBA1C 5.9 (H) 01/19/2022     Assessment/Plan  1. Neck pain Instructed patient to take tylenol prn  Use heating pads  Will refer to PT - Ambulatory referral to Physical Therapy  2. Acute bilateral low back pain without sciatica No red flag signs Strength intact  Instructed to take tylenol prn for pain  Use lidocaine patches - lidocaine (HM LIDOCAINE PATCH) 4 %; Place 1 patch onto the skin daily.  Dispense: 30 patch; Refill: 0  3. Primary insomnia Discussed regarding sleep hygiene Avoid daytime naps Avoid drinking excessive coffee Avoid watching TV, phone while on bed Take melatonin   No follow-ups on file.: 3 months

## 2023-04-13 DIAGNOSIS — S161XXD Strain of muscle, fascia and tendon at neck level, subsequent encounter: Secondary | ICD-10-CM | POA: Diagnosis not present

## 2023-04-13 DIAGNOSIS — M542 Cervicalgia: Secondary | ICD-10-CM | POA: Diagnosis not present

## 2023-04-25 DIAGNOSIS — H35373 Puckering of macula, bilateral: Secondary | ICD-10-CM | POA: Diagnosis not present

## 2023-04-25 DIAGNOSIS — H40013 Open angle with borderline findings, low risk, bilateral: Secondary | ICD-10-CM | POA: Diagnosis not present

## 2023-04-25 DIAGNOSIS — H25813 Combined forms of age-related cataract, bilateral: Secondary | ICD-10-CM | POA: Diagnosis not present

## 2023-04-25 DIAGNOSIS — H00014 Hordeolum externum left upper eyelid: Secondary | ICD-10-CM | POA: Diagnosis not present

## 2023-04-26 NOTE — Progress Notes (Unsigned)
Cardiology Office Note:    Date:  04/28/2023   ID:  Joseph Marsh, DOB 05-Jun-1946, MRN [id number]  PCP:  Venita Sheffield, MD  Cardiologist:  Little Ishikawa, MD  Electrophysiologist:  None   Referring MD: Charlott Holler, MD   Chief Complaint  Patient presents with   Coronary Artery Disease    History of Present Illness:    Joseph Marsh is a 77 y.o. male with a hx of hyperlipidemia, prostate cancer, spinal stenosis, bronchiectasis who is referred by Dr. Celine Mans for evaluation of irregular heart rhythm and CAD.  CT chest 02/2023 showed severe coronary calcifications.  Denies any chest pain, dyspnea, lightheadedness, syncope, lower extremity edema, or palpitations.  Reports he does not go for walks due to knee pain but does yard work for exercise.  Never smoked.  Family history includes father died of intracranial hemorrhage at age 42.  Past Medical History:  Diagnosis Date   Basal cell carcinoma (BCC)    BPH (benign prostatic hyperplasia)    Cellulitis    Eczema    History of arthroscopic knee surgery    Hyperlipidemia    Kidney stone    Plantar fasciitis    Prostate cancer (HCC) 2012   Right shoulder pain    Sinusitis    Spinal stenosis    Squamous cell carcinoma in situ     Past Surgical History:  Procedure Laterality Date   CHOLECYSTECTOMY N/A 05/24/2013   Procedure: LAPAROSCOPIC CHOLECYSTECTOMY;  Surgeon: Mariella Saa, MD;  Location: [facility];  Service: General;  Laterality: N/A;   COLONOSCOPY  1996   polp removal   COLONOSCOPY  08/2020   polpys removed   KNEE SURGERY     LITHOTRIPSY     MRI     sinus cauterization  1970   TONSILLECTOMY      Current Medications: Current Meds  Medication Sig   acetaminophen (TYLENOL) 500 MG tablet Take 500 mg by mouth as needed.   Ascorbic Acid (VITAMIN C) 1000 MG tablet Take 1,000 mg by mouth daily.    atorvastatin (LIPITOR) 20 MG tablet Take 1 tablet (20 mg total) by mouth daily.   BIOTIN PO  Take 1,000 mcg by mouth daily.    Cholecalciferol (VITAMIN D-3 PO) Take 2,000 Units by mouth daily. 2   Coenzyme Q10 (CO Q 10) 100 MG CAPS Take 300 mg by mouth daily.   Cyanocobalamin (VITAMIN B 12 PO) Take 2,500 mcg by mouth.   finasteride (PROSCAR) 5 MG tablet Take 5 mg by mouth daily.   fluocinonide-emollient (LIDEX-E) 0.05 % cream Apply 1 application  topically as needed.   Glucos-Chondroit-Collag-Hyal (GLUCOSAMINE CHONDROIT-COLLAGEN PO) Take 1 capsule by mouth 2 (two) times daily.    MELATONIN PO Take 10 mg by mouth at bedtime.   Multiple Vitamin (MULTIVITAMIN) tablet Take 1 tablet by mouth daily.   Omega-3 Fatty Acids (FISH OIL PO) Take 1,950 mg by mouth daily.   Turmeric Curcumin 500 MG CAPS Take by mouth daily.   [DISCONTINUED] atorvastatin (LIPITOR) 10 MG tablet Take 1 tablet (10 mg total) by mouth daily.     Allergies:   Patient has no known allergies.   Social History   Socioeconomic History   Marital status: Married    Spouse name: Not on file   Number of children: 2   Years of education: Not on file   Highest education level: Master's degree (e.g., MA, MS, MEng, MEd, MSW, MBA)  Occupational History    Employer:  [employer]  Tobacco Use   Smoking status: Never   Smokeless tobacco: Never  Vaping Use   Vaping status: Never Used  Substance and Sexual Activity   Alcohol use: No   Drug use: No   Sexual activity: Yes  Other Topics Concern   Not on file  Social History Narrative   As of 06/19/2018 [facility] New Patient Packet:      Diet: Fruits, nuts, protein, carbohydrates, sodium, fats, veggies, and some seafood       Caffeine: Average 5 12oz sodas per week       Married, if yes what year: Divorced x 2, married 1970-1999 (1st time) and 2004-2016 (second time)       Do you live in a house, apartment, assisted living, condo, trailer, ect: 3 bed room house, 1 stories, one person       Pets: No      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 No   Do you have difficulty preparing food or eating? No   Do you have difficulty managing your medications? No   Do you have difficulty managing your finances? No   Do you have difficulty affording your medications? No   Social Determinants of Health   Financial Resource Strain: Low Risk  (03/08/2023)   Overall Financial Resource Strain (CARDIA)    Difficulty of Paying Living Expenses: Not hard at all  Food Insecurity: No Food Insecurity (03/08/2023)   Hunger Vital Sign    Worried About Running Out of Food in the Last Year: Never true    Ran Out of Food in the Last Year: Never true  Transportation Needs: No Transportation Needs (03/08/2023)   PRAPARE - Administrator, Civil Service (Medical): No    Lack of Transportation (Non-Medical): No  Physical Activity: Sufficiently Active (03/08/2023)   Exercise Vital Sign    Days of Exercise per Week: 2 days    Minutes of Exercise per Session: 150+ min  Stress: Stress Concern Present (03/08/2023)   [employer] of Occupational Health - Occupational Stress Questionnaire    Feeling of Stress : To some extent  Social Connections: Moderately Integrated (03/08/2023)   Social Connection and Isolation Panel [NHANES]    Frequency of Communication with Friends and Family: More than three times a week    Frequency of Social Gatherings with Friends and Family: Patient declined    Attends Religious Services: 1 to 4 times per year    Active Member of [employer] or Organizations: No    Attends Banker Meetings: Never    Marital Status: Married     Family History: The patient's family history includes Alcoholism in his son; Asthma in his nephew; Breast cancer in his sister; Depression in his son; Diverticulitis in  his sister and sister; Heart disease in his father; Melanoma in his brother; Osteoporosis in his sister; Other in his mother; Pneumonia in his nephew; Thyroid disease in his sister. There is no history of Colon cancer, Esophageal cancer, Stomach cancer, or Pancreatic cancer.  ROS:   Please see the history of present illness.     All other systems reviewed and are negative.  EKGs/Labs/Other Studies Reviewed:  The following studies were reviewed today:   EKG:   02/23/2023: Sinus rhythm with ventricular bigeminy, rate 73, QTc 430 04/28/23: Sinus bradycardia, rate 54, QTc 400  Recent Labs: 02/24/2023: ALT 20; BUN 17; Creat 0.88; Hemoglobin 14.4; Platelets 197; Potassium 4.2; Sodium 139; TSH 7.05  Recent Lipid Panel    Component Value Date/Time   CHOL 163 02/24/2023 0807   TRIG 146 02/24/2023 0807   HDL 39 (L) 02/24/2023 0807   CHOLHDL 4.2 02/24/2023 0807   VLDL 62 (H) 09/11/2009 0137   LDLCALC 99 02/24/2023 0807    Physical Exam:    VS:  BP (!) 110/52 (BP Location: Left Arm, Patient Position: Sitting, Cuff Size: Normal)   Pulse 62   Ht 5\' 10"  (1.778 m)   Wt 173 lb 3.2 oz (78.6 kg)   SpO2 97%   BMI 24.85 kg/m     Wt Readings from Last 3 Encounters:  04/28/23 173 lb 3.2 oz (78.6 kg)  04/05/23 173 lb 3.2 oz (78.6 kg)  03/08/23 170 lb 9.6 oz (77.4 kg)     GEN:  Well nourished, well developed in no acute distress HEENT: Normal NECK: No JVD; No carotid bruits LYMPHATICS: No lymphadenopathy CARDIAC: Irregular RESPIRATORY:  Clear to auscultation without rales, wheezing or rhonchi  ABDOMEN: Soft, non-tender, non-distended MUSCULOSKELETAL:  No edema; No deformity  SKIN: Warm and dry NEUROLOGIC:  Alert and oriented x 3 PSYCHIATRIC:  Normal affect   ASSESSMENT:    1. Coronary artery disease involving native coronary artery of native heart, unspecified whether angina present   2. Frequent PVCs   3. Hyperlipidemia, unspecified hyperlipidemia type    PLAN:    CAD: CT  chest 02/2023 showed severe coronary calcifications.  He is not reporting anginal symptoms but activity has been limited due to knee pain; not a treadmill candidate. Recommend stress PET to evaluate for ischemia  Frequent PVCs: Noted to be in ventricular bigeminy on recent EKG 02/2023.  Check Zio patch x 3 days to quantify PVC burden.  Check echocardiogram to evaluate for structural heart disease  Hyperlipidemia: On atorvastatin 10 mg daily, LDL 99 on 02/24/2023.  Increase atorvastatin to 20 mg daily given severe coronary calcifications as above  RTC in 3 months  Informed Consent   Shared Decision Making/Informed Consent The risks [chest pain, shortness of breath, cardiac arrhythmias, dizziness, blood pressure fluctuations, myocardial infarction, stroke/transient ischemic attack, nausea, vomiting, allergic reaction, radiation exposure, metallic taste sensation and life-threatening complications (estimated to be 1 in 10,000)], benefits (risk stratification, diagnosing coronary artery disease, treatment guidance) and alternatives of a cardiac PET stress test were discussed in detail with Joseph Marsh and he agrees to proceed.      Medication Adjustments/Labs and Tests Ordered: Current medicines are reviewed at length with the patient today.  Concerns regarding medicines are outlined above.  Orders Placed This Encounter  Procedures   NM PET CT CARDIAC PERFUSION MULTI W/ABSOLUTE BLOODFLOW   LONG TERM MONITOR (3-14 DAYS)   EKG 12-Lead   ECHOCARDIOGRAM COMPLETE   Meds ordered this encounter  Medications   atorvastatin (LIPITOR) 20 MG tablet    Sig: Take 1 tablet (20 mg total) by mouth daily.    Dispense:  90 tablet    Refill:  3    Patient Instructions  Medication Instructions:  Increase you Atorvastatin 20 mg daily  Continue current medications *If you need a refill on your cardiac medications before your next appointment, please call your pharmacy*   Lab Work: none If  you have labs  (blood work) drawn today and your tests are completely normal, you will receive your results only by: MyChart Message (if you have MyChart) OR A paper copy in the mail If you have any lab test that is abnormal or we need to change your treatment, we will call you to review the results.   Testing/Procedures: How to Prepare for Your Cardiac PET/CT Stress Test:  1. Please take all schedule medications before your test:    2. Nothing to eat or drink, except water, 3 hours prior to arrival time.   NO caffeine/decaffeinated products, or chocolate 12 hours prior to arrival.  3. NO perfume, cologne or lotion on chest or abdomen area.           4. Total time is 1 to 2 hours; you may want to bring reading material for the waiting time.  5. Please report to Radiology at the [facility] Main Entrance 30 minutes early for your test.  [address] [place], [place] [address]      In preparation for your appointment, medication and supplies will be purchased.  Appointment availability is limited, so if you need to cancel or reschedule, please call the Radiology Department at [phone] [facility])  24 hours in advance to avoid a cancellation fee of $100.00  What to Expect After you Arrive:  Once you arrive and check in for your appointment, you will be taken to a preparation room within the Radiology Department.  A technologist or Nurse will obtain your medical history, verify that you are correctly prepped for the exam, and explain the procedure.  Afterwards,  an IV will be started in your arm and electrodes will be placed on your skin for EKG monitoring during the stress portion of the exam. Then you will be escorted to the PET/CT scanner.  There, staff will get you positioned on the scanner and obtain a blood pressure and EKG.  During the exam, you will continue to be connected to the EKG and blood pressure machines.  A small, safe amount of a radioactive tracer will be injected  in your IV to obtain a series of pictures of your heart along with an injection of a stress agent.    After your Exam:  It is recommended that you eat a meal and drink a caffeinated beverage to counter act any effects of the stress agent.  Drink plenty of fluids for the remainder of the day and urinate frequently for the first couple of hours after the exam.  Your doctor will inform you of your test results within 7-10 business days.  For more information and frequently asked questions, please visit our website : [email or web address]  For questions about your test or how to prepare for your test, please call: Cardiac Imaging Nurse Navigators Office: [phone]    And    ZIO XT- Long Term Monitor Instructions  Your physician has requested you wear a ZIO patch monitor for 3 days.  This is a single patch monitor. [employer] supplies one patch monitor per enrollment. Additional stickers are not available. Please do not apply patch if you will be having a Nuclear Stress Test,  Echocardiogram, Cardiac CT, MRI, or Chest Xray during the period you would be wearing the  monitor. The patch cannot be worn during these tests. You cannot remove and re-apply the  ZIO XT patch monitor.  Your ZIO patch monitor will be mailed 3 day USPS to your address on file. It  may take 3-5 days  to receive your monitor after you have been enrolled.  Once you have received your monitor, please review the enclosed instructions. Your monitor  has already been registered assigning a specific monitor serial # to you.  Billing and Patient Assistance Program Information  We have supplied [employer] with any of your insurance information on file for billing purposes. [employer] offers a sliding scale Patient Assistance Program for patients that do not have  insurance, or whose insurance does not completely cover the cost of the ZIO monitor.  You must apply for the Patient Assistance Program to qualify for this  discounted rate.  To apply, please call [employer] at [phone], select option 4, select option 2, ask to apply for  Patient Assistance Program. Meredeth Ide will ask your household income, and how many people  are in your household. They will quote your out-of-pocket cost based on that information.  [employer] will also be able to set up a 1-month, interest-free payment plan if needed.  Applying the monitor   Shave hair from upper left chest.  Hold abrader disc by orange tab. Rub abrader in 40 strokes over the upper left chest as  indicated in your monitor instructions.  Clean area with 4 enclosed alcohol pads. Let dry.  Apply patch as indicated in monitor instructions. Patch will be placed under collarbone on left  side of chest with arrow pointing upward.  Rub patch adhesive wings for 2 minutes. Remove white label marked "1". Remove the white  label marked "2". Rub patch adhesive wings for 2 additional minutes.  While looking in a mirror, press and release button in center of patch. A small green light will  flash 3-4 times. This will be your only indicator that the monitor has been turned on.  Do not shower for the first 24 hours. You may shower after the first 24 hours.  Press the button if you feel a symptom. You will hear a small click. Record Date, Time and  Symptom in the Patient Logbook.  When you are ready to remove the patch, follow instructions on the last 2 pages of Patient  Logbook. Stick patch monitor onto the last page of Patient Logbook.  Place Patient Logbook in the blue and white box. Use locking tab on box and tape box closed  securely. The blue and white box has prepaid postage on it. Please place it in the mailbox as  soon as possible. Your physician should have your test results approximately 7 days after the  monitor has been mailed back to [facility].  Call [facility] Customer Care at [phone] if you have questions regarding  your ZIO XT patch monitor. Call  them immediately if you see an orange light blinking on your  monitor.  If your monitor falls off in less than 4 days, contact our Monitor department at [phone].  If your monitor becomes loose or falls off after 4 days call [employer] at [phone] for  suggestions on securing your monitor    AND   ECHO    Your physician has requested that you have an echocardiogram. Echocardiography is a painless test that uses sound waves to create images of your heart. It provides your doctor with information about the size and shape of your heart and how well your heart's chambers and valves are working. This procedure takes approximately one hour. There are no restrictions for this procedure. Please do NOT wear cologne, perfume, aftershave, or lotions (deodorant is allowed). Please arrive 55  minutes prior to your appointment time.  Please note: We ask at that you not bring children with you during ultrasound (echo/ vascular) testing. Due to room size and safety concerns, children are not allowed in the ultrasound rooms during exams. Our front office staff cannot provide observation of children in our lobby area while testing is being conducted. An adult accompanying a patient to their appointment will only be allowed in the ultrasound room at the discretion of the ultrasound technician under special circumstances. We apologize for any inconvenience.    Follow-Up: At [facility], you and your health needs are our priority.  As part of our continuing mission to provide you with exceptional heart care, we have created designated Provider Care Teams.  These Care Teams include your primary Cardiologist (physician) and Advanced Practice Providers (APPs -  Physician Assistants and Nurse Practitioners) who all work together to provide you with the care you need, when you need it.  We recommend signing up for the patient portal called "MyChart".  Sign up information is provided on this After Visit  Summary.  MyChart is used to connect with patients for Virtual Visits (Telemedicine).  Patients are able to view lab/test results, encounter notes, upcoming appointments, etc.  Non-urgent messages can be sent to your provider as well.   To learn more about what you can do with MyChart, go to [email or web address].    Your next appointment:   3 month(s)  Provider:   Little Ishikawa, MD        Signed, Little Ishikawa, MD  04/28/2023 5:17 PM    [facility]

## 2023-04-27 DIAGNOSIS — S161XXD Strain of muscle, fascia and tendon at neck level, subsequent encounter: Secondary | ICD-10-CM | POA: Diagnosis not present

## 2023-04-27 DIAGNOSIS — M542 Cervicalgia: Secondary | ICD-10-CM | POA: Diagnosis not present

## 2023-04-28 ENCOUNTER — Ambulatory Visit: Payer: Medicare Other | Attending: Cardiology | Admitting: Cardiology

## 2023-04-28 ENCOUNTER — Encounter: Payer: Self-pay | Admitting: Cardiology

## 2023-04-28 ENCOUNTER — Ambulatory Visit (INDEPENDENT_AMBULATORY_CARE_PROVIDER_SITE_OTHER): Payer: Medicare Other

## 2023-04-28 VITALS — BP 110/52 | HR 62 | Ht 70.0 in | Wt 173.2 lb

## 2023-04-28 DIAGNOSIS — I493 Ventricular premature depolarization: Secondary | ICD-10-CM | POA: Insufficient documentation

## 2023-04-28 DIAGNOSIS — Z136 Encounter for screening for cardiovascular disorders: Secondary | ICD-10-CM | POA: Diagnosis not present

## 2023-04-28 DIAGNOSIS — I2510 Atherosclerotic heart disease of native coronary artery without angina pectoris: Secondary | ICD-10-CM | POA: Diagnosis not present

## 2023-04-28 DIAGNOSIS — E785 Hyperlipidemia, unspecified: Secondary | ICD-10-CM | POA: Diagnosis not present

## 2023-04-28 MED ORDER — ATORVASTATIN CALCIUM 20 MG PO TABS
20.0000 mg | ORAL_TABLET | Freq: Every day | ORAL | 3 refills | Status: DC
Start: 2023-04-28 — End: 2024-04-25

## 2023-04-28 NOTE — Patient Instructions (Addendum)
Medication Instructions:  Increase you Atorvastatin 20 mg daily  Continue current medications *If you need a refill on your cardiac medications before your next appointment, please call your pharmacy*   Lab Work: none If you have labs (blood work) drawn today and your tests are completely normal, you will receive your results only by: MyChart Message (if you have MyChart) OR A paper copy in the mail If you have any lab test that is abnormal or we need to change your treatment, we will call you to review the results.   Testing/Procedures: How to Prepare for Your Cardiac PET/CT Stress Test:  1. Please take all schedule medications before your test:    2. Nothing to eat or drink, except water, 3 hours prior to arrival time.   NO caffeine/decaffeinated products, or chocolate 12 hours prior to arrival.  3. NO perfume, cologne or lotion on chest or abdomen area.           4. Total time is 1 to 2 hours; you may want to bring reading material for the waiting time.  5. Please report to Radiology at the [facility] Main Entrance 30 minutes early for your test.  [address] [place], [place] [address]      In preparation for your appointment, medication and supplies will be purchased.  Appointment availability is limited, so if you need to cancel or reschedule, please call the Radiology Department at [phone] [facility])  24 hours in advance to avoid a cancellation fee of $100.00  What to Expect After you Arrive:  Once you arrive and check in for your appointment, you will be taken to a preparation room within the Radiology Department.  A technologist or Nurse will obtain your medical history, verify that you are correctly prepped for the exam, and explain the procedure.  Afterwards,  an IV will be started in your arm and electrodes will be placed on your skin for EKG monitoring during the stress portion of the exam. Then you will be escorted to the PET/CT scanner.   There, staff will get you positioned on the scanner and obtain a blood pressure and EKG.  During the exam, you will continue to be connected to the EKG and blood pressure machines.  A small, safe amount of a radioactive tracer will be injected in your IV to obtain a series of pictures of your heart along with an injection of a stress agent.    After your Exam:  It is recommended that you eat a meal and drink a caffeinated beverage to counter act any effects of the stress agent.  Drink plenty of fluids for the remainder of the day and urinate frequently for the first couple of hours after the exam.  Your doctor will inform you of your test results within 7-10 business days.  For more information and frequently asked questions, please visit our website : [email or web address]  For questions about your test or how to prepare for your test, please call: Cardiac Imaging Nurse Navigators Office: [phone]    And    ZIO XT- Long Term Monitor Instructions  Your physician has requested you wear a ZIO patch monitor for 3 days.  This is a single patch monitor. [employer] supplies one patch monitor per enrollment. Additional stickers are not available. Please do not apply patch if you will be having a Nuclear Stress Test,  Echocardiogram, Cardiac CT, MRI, or Chest Xray during the period you would be wearing the  monitor. The  patch cannot be worn during these tests. You cannot remove and re-apply the  ZIO XT patch monitor.  Your ZIO patch monitor will be mailed 3 day USPS to your address on file. It may take 3-5 days  to receive your monitor after you have been enrolled.  Once you have received your monitor, please review the enclosed instructions. Your monitor  has already been registered assigning a specific monitor serial # to you.  Billing and Patient Assistance Program Information  We have supplied [employer] with any of your insurance information on file for billing  purposes. [employer] offers a sliding scale Patient Assistance Program for patients that do not have  insurance, or whose insurance does not completely cover the cost of the ZIO monitor.  You must apply for the Patient Assistance Program to qualify for this discounted rate.  To apply, please call [employer] at [phone], select option 4, select option 2, ask to apply for  Patient Assistance Program. Meredeth Ide will ask your household income, and how many people  are in your household. They will quote your out-of-pocket cost based on that information.  [employer] will also be able to set up a 6-month, interest-free payment plan if needed.  Applying the monitor   Shave hair from upper left chest.  Hold abrader disc by orange tab. Rub abrader in 40 strokes over the upper left chest as  indicated in your monitor instructions.  Clean area with 4 enclosed alcohol pads. Let dry.  Apply patch as indicated in monitor instructions. Patch will be placed under collarbone on left  side of chest with arrow pointing upward.  Rub patch adhesive wings for 2 minutes. Remove white label marked "1". Remove the white  label marked "2". Rub patch adhesive wings for 2 additional minutes.  While looking in a mirror, press and release button in center of patch. A small green light will  flash 3-4 times. This will be your only indicator that the monitor has been turned on.  Do not shower for the first 24 hours. You may shower after the first 24 hours.  Press the button if you feel a symptom. You will hear a small click. Record Date, Time and  Symptom in the Patient Logbook.  When you are ready to remove the patch, follow instructions on the last 2 pages of Patient  Logbook. Stick patch monitor onto the last page of Patient Logbook.  Place Patient Logbook in the blue and white box. Use locking tab on box and tape box closed  securely. The blue and white box has prepaid postage on it. Please place it in the mailbox as  soon  as possible. Your physician should have your test results approximately 7 days after the  monitor has been mailed back to [facility].  Call [facility] Customer Care at [phone] if you have questions regarding  your ZIO XT patch monitor. Call them immediately if you see an orange light blinking on your  monitor.  If your monitor falls off in less than 4 days, contact our Monitor department at [phone].  If your monitor becomes loose or falls off after 4 days call [employer] at [phone] for  suggestions on securing your monitor    AND   ECHO    Your physician has requested that you have an echocardiogram. Echocardiography is a painless test that uses sound waves to create images of your heart. It provides your doctor with information about the size and shape of your heart and how well  your heart's chambers and valves are working. This procedure takes approximately one hour. There are no restrictions for this procedure. Please do NOT wear cologne, perfume, aftershave, or lotions (deodorant is allowed). Please arrive 15 minutes prior to your appointment time.  Please note: We ask at that you not bring children with you during ultrasound (echo/ vascular) testing. Due to room size and safety concerns, children are not allowed in the ultrasound rooms during exams. Our front office staff cannot provide observation of children in our lobby area while testing is being conducted. An adult accompanying a patient to their appointment will only be allowed in the ultrasound room at the discretion of the ultrasound technician under special circumstances. We apologize for any inconvenience.    Follow-Up: At [facility], you and your health needs are our priority.  As part of our continuing mission to provide you with exceptional heart care, we have created designated Provider Care Teams.  These Care Teams include your primary Cardiologist (physician) and Advanced Practice  Providers (APPs -  Physician Assistants and Nurse Practitioners) who all work together to provide you with the care you need, when you need it.  We recommend signing up for the patient portal called "MyChart".  Sign up information is provided on this After Visit Summary.  MyChart is used to connect with patients for Virtual Visits (Telemedicine).  Patients are able to view lab/test results, encounter notes, upcoming appointments, etc.  Non-urgent messages can be sent to your provider as well.   To learn more about what you can do with MyChart, go to [email or web address].    Your next appointment:   3 month(s)  Provider:   Little Ishikawa, MD

## 2023-04-28 NOTE — Progress Notes (Unsigned)
Enrolled patient for a 3 day Zio XT monitor to be mailed to patients home  

## 2023-05-01 DIAGNOSIS — Z136 Encounter for screening for cardiovascular disorders: Secondary | ICD-10-CM | POA: Diagnosis not present

## 2023-05-01 DIAGNOSIS — I493 Ventricular premature depolarization: Secondary | ICD-10-CM | POA: Diagnosis not present

## 2023-05-03 ENCOUNTER — Encounter: Payer: Self-pay | Admitting: Sports Medicine

## 2023-05-03 ENCOUNTER — Ambulatory Visit (INDEPENDENT_AMBULATORY_CARE_PROVIDER_SITE_OTHER): Payer: Medicare Other | Admitting: Sports Medicine

## 2023-05-03 VITALS — BP 120/66 | HR 67 | Temp 97.4°F | Resp 16 | Ht 70.0 in | Wt 174.6 lb

## 2023-05-03 DIAGNOSIS — E038 Other specified hypothyroidism: Secondary | ICD-10-CM | POA: Diagnosis not present

## 2023-05-03 DIAGNOSIS — R002 Palpitations: Secondary | ICD-10-CM | POA: Diagnosis not present

## 2023-05-03 DIAGNOSIS — M542 Cervicalgia: Secondary | ICD-10-CM | POA: Diagnosis not present

## 2023-05-03 NOTE — Progress Notes (Signed)
Careteam: Patient Care Team: Venita Sheffield, MD as PCP - General (Internal Medicine) Little Ishikawa, MD as PCP - Cardiology (Cardiology) Ollen Gross, MD as Consulting Physician (Orthopedic Surgery) Donzetta Starch, MD as Consulting Physician (Dermatology) Heloise Purpura, MD as Consulting Physician (Urology) Charlott Holler, MD as Consulting Physician (Pulmonary Disease) Little Ishikawa, MD as Consulting Physician (Cardiology)  PLACE OF SERVICE:  [facility]  Advanced Directive information Does Patient Have a Medical Advance Directive?: No, Would patient like information on creating a medical advance directive?: No - Patient declined  No Known Allergies  Chief Complaint  Patient presents with   Medical Management of Chronic Issues    2 month follow up.    Immunizations    Discuss the need for [employer]. [facility] Verified.      HPI: Patient is a 77 y.o. male is here for follow up  He saw cardiology recently  He is wearing heart monitor  Denies chest pain, palpitations, dizzy or lightheadedness Pt reports that he is scheduled for stress test as per cardiology   Denies cough  Reports good appetite  Denies night sweats   States that he does yard work in his back yard  Denies chest pain , SOB with exertion  He recently completed PT for hip and currently doing for his neck an shoulders C/o intermittent pain in his neck with movements Denies tingling or numbness in his hands Reports good strength in his hands   H/o Prostate cancer Follows with urology  Recently seen them 02/2023   Abnormal TSH  Lab Results  Component Value Date   TSH 7.05 (H) 02/24/2023  Denies diarrhea , tremors   Sees dermatology once a year , had a recent visit in September   Lives alone, independent with his ADLS and IADLS    Review of Systems:  Review of Systems  Constitutional:  Negative for chills and fever.  HENT:  Negative for congestion and sore throat.    Eyes:  Negative for double vision.  Respiratory:  Negative for cough, sputum production and shortness of breath.   Cardiovascular:  Negative for chest pain, palpitations and leg swelling.  Gastrointestinal:  Negative for abdominal pain, heartburn and nausea.  Genitourinary:  Negative for dysuria, frequency and hematuria.  Musculoskeletal:  Positive for joint pain. Negative for falls and myalgias.  Neurological:  Negative for dizziness, sensory change and focal weakness.    Past Medical History:  Diagnosis Date   Basal cell carcinoma (BCC)    BPH (benign prostatic hyperplasia)    Cellulitis    Eczema    History of arthroscopic knee surgery    Hyperlipidemia    Kidney stone    Plantar fasciitis    Prostate cancer (HCC) 2012   Right shoulder pain    Sinusitis    Spinal stenosis    Squamous cell carcinoma in situ    Past Surgical History:  Procedure Laterality Date   CHOLECYSTECTOMY N/A 05/24/2013   Procedure: LAPAROSCOPIC CHOLECYSTECTOMY;  Surgeon: Mariella Saa, MD;  Location: [facility];  Service: General;  Laterality: N/A;   COLONOSCOPY  1996   polp removal   COLONOSCOPY  08/2020   polpys removed   KNEE SURGERY     LITHOTRIPSY     MRI     sinus cauterization  1970   TONSILLECTOMY     Social History:   reports that he has never smoked. He has never used smokeless tobacco. He reports that he does not drink  alcohol and does not use drugs.  Family History  Problem Relation Age of Onset   Other Mother        Organ Failure, no specified per [facility] new patient packet    Heart disease Father        died from. Cerebral Hemorrage, smoker and ETOH    Osteoporosis Sister    Diverticulitis Sister    Thyroid disease Sister    Breast cancer Sister    Diverticulitis Sister    Melanoma Brother    Depression Son    Alcoholism Son    Pneumonia Nephew    Asthma Nephew    Colon cancer Neg Hx    Esophageal cancer Neg Hx    Stomach cancer Neg Hx    Pancreatic cancer Neg Hx      Medications: Patient's Medications  New Prescriptions   No medications on file  Previous Medications   ACETAMINOPHEN (TYLENOL) 500 MG TABLET    Take 500 mg by mouth as needed.   ASCORBIC ACID (VITAMIN C) 1000 MG TABLET    Take 1,000 mg by mouth daily.    ATORVASTATIN (LIPITOR) 20 MG TABLET    Take 1 tablet (20 mg total) by mouth daily.   BIOTIN PO    Take 1,000 mcg by mouth daily.    CHOLECALCIFEROL (VITAMIN D-3 PO)    Take 2,000 Units by mouth daily. 2   COENZYME Q10 (CO Q 10) 100 MG CAPS    Take 300 mg by mouth daily.   CYANOCOBALAMIN (VITAMIN B 12 PO)    Take 2,500 mcg by mouth.   FINASTERIDE (PROSCAR) 5 MG TABLET    Take 5 mg by mouth daily.   FLUOCINONIDE-EMOLLIENT (LIDEX-E) 0.05 % CREAM    Apply 1 application  topically as needed.   GLUCOS-CHONDROIT-COLLAG-HYAL (GLUCOSAMINE CHONDROIT-COLLAGEN PO)    Take 1 capsule by mouth 2 (two) times daily.    MELATONIN PO    Take 10 mg by mouth at bedtime.   MULTIPLE VITAMIN (MULTIVITAMIN) TABLET    Take 1 tablet by mouth daily.   OMEGA-3 FATTY ACIDS (FISH OIL PO)    Take 1,950 mg by mouth daily.   TURMERIC CURCUMIN 500 MG CAPS    Take by mouth daily.  Modified Medications   No medications on file  Discontinued Medications   LIDOCAINE (HM LIDOCAINE PATCH) 4 %    Place 1 patch onto the skin daily.   TAMSULOSIN (FLOMAX) 0.4 MG CAPS CAPSULE    Take 0.4 mg by mouth daily. Prescribed by urologist    Physical Exam:  Vitals:   05/03/23 1459  BP: 120/66  Pulse: 67  Resp: 16  Temp: (!) 97.4 F (36.3 C)  SpO2: 96%  Weight: 174 lb 9.6 oz (79.2 kg)  Height: 5\' 10"  (1.778 m)   Body mass index is 25.05 kg/m. Wt Readings from Last 3 Encounters:  05/03/23 174 lb 9.6 oz (79.2 kg)  04/28/23 173 lb 3.2 oz (78.6 kg)  04/05/23 173 lb 3.2 oz (78.6 kg)    Physical Exam Constitutional:      Appearance: Normal appearance.  HENT:     Head: Normocephalic and atraumatic.  Cardiovascular:     Rate and Rhythm: Normal rate and regular rhythm.      Pulses: Normal pulses.     Heart sounds: Normal heart sounds.  Pulmonary:     Effort: No respiratory distress.     Breath sounds: No stridor. No wheezing or rales.  Abdominal:  General: Bowel sounds are normal. There is no distension.     Palpations: Abdomen is soft.     Tenderness: There is no abdominal tenderness. There is no right CVA tenderness or guarding.  Musculoskeletal:     Comments: Neck , rom slightly limited with pain  Good grip strength in his upper extremities  Neurological:     Mental Status: He is alert. Mental status is at baseline.     Sensory: No sensory deficit.     Motor: No weakness.     Comments: Mild bil hand tremors       Labs reviewed: Basic Metabolic Panel: Recent Labs    02/24/23 0807  NA 139  K 4.2  CL 103  CO2 28  GLUCOSE 119*  BUN 17  CREATININE 0.88  CALCIUM 8.9  TSH 7.05*   Liver Function Tests: Recent Labs    02/24/23 0807  AST 16  ALT 20  BILITOT 0.4  PROT 6.5   No results for input(s): "LIPASE", "AMYLASE" in the last 8760 hours. No results for input(s): "AMMONIA" in the last 8760 hours. CBC: Recent Labs    02/24/23 0807  WBC 4.3  NEUTROABS 2,636  HGB 14.4  HCT 43.9  MCV 88.3  PLT 197   Lipid Panel: Recent Labs    02/24/23 0807  CHOL 163  HDL 39*  LDLCALC 99  TRIG 409  CHOLHDL 4.2   TSH: Recent Labs    02/24/23 0807  TSH 7.05*   A1C: Lab Results  Component Value Date   HGBA1C 5.9 (H) 01/19/2022     Assessment/Plan 1. Subclinical hypothyroidism Denies diarrhea Weight stable Has mild hand tremors He is wearing holter monitor as per cardiology Will check TSH today - TSH -   2. Neck pain Take tylenol prn for pain  Cont with PT   3. Palpitations Denies palpitations , chest pain    No follow-ups on file.:

## 2023-05-04 DIAGNOSIS — S161XXD Strain of muscle, fascia and tendon at neck level, subsequent encounter: Secondary | ICD-10-CM | POA: Diagnosis not present

## 2023-05-04 DIAGNOSIS — M542 Cervicalgia: Secondary | ICD-10-CM | POA: Diagnosis not present

## 2023-05-05 LAB — TSH: TSH: 4.49 m[IU]/L (ref 0.40–4.50)

## 2023-05-05 NOTE — Progress Notes (Signed)
Plz call the patient and inform that thyroid test is wnl.

## 2023-05-11 DIAGNOSIS — S161XXD Strain of muscle, fascia and tendon at neck level, subsequent encounter: Secondary | ICD-10-CM | POA: Diagnosis not present

## 2023-05-11 DIAGNOSIS — I493 Ventricular premature depolarization: Secondary | ICD-10-CM | POA: Diagnosis not present

## 2023-05-11 DIAGNOSIS — M542 Cervicalgia: Secondary | ICD-10-CM | POA: Diagnosis not present

## 2023-05-11 DIAGNOSIS — Z136 Encounter for screening for cardiovascular disorders: Secondary | ICD-10-CM | POA: Diagnosis not present

## 2023-05-16 ENCOUNTER — Encounter (HOSPITAL_COMMUNITY): Payer: Self-pay

## 2023-05-17 ENCOUNTER — Telehealth (HOSPITAL_COMMUNITY): Payer: Self-pay | Admitting: *Deleted

## 2023-05-17 DIAGNOSIS — H401121 Primary open-angle glaucoma, left eye, mild stage: Secondary | ICD-10-CM | POA: Diagnosis not present

## 2023-05-17 NOTE — Telephone Encounter (Signed)
Attempted to call patient regarding upcoming cardiac PET appointment. Left message on voicemail with name and callback number  Larey Brick RN Navigator Cardiac Imaging [facility] [phone] Office [phone] Cell  Reminder to hold caffeine 12 hours prior to his cardiac PET appt.

## 2023-05-18 ENCOUNTER — Ambulatory Visit (HOSPITAL_COMMUNITY)
Admission: RE | Admit: 2023-05-18 | Discharge: 2023-05-18 | Disposition: A | Discharge status: Home / Self Care | Payer: Medicare Other | Source: Ambulatory Visit | Attending: Cardiology | Admitting: Cardiology

## 2023-05-18 DIAGNOSIS — S161XXD Strain of muscle, fascia and tendon at neck level, subsequent encounter: Secondary | ICD-10-CM | POA: Diagnosis not present

## 2023-05-18 DIAGNOSIS — I2510 Atherosclerotic heart disease of native coronary artery without angina pectoris: Secondary | ICD-10-CM | POA: Insufficient documentation

## 2023-05-18 DIAGNOSIS — M542 Cervicalgia: Secondary | ICD-10-CM | POA: Diagnosis not present

## 2023-05-18 DIAGNOSIS — I493 Ventricular premature depolarization: Secondary | ICD-10-CM | POA: Diagnosis not present

## 2023-05-18 MED ORDER — REGADENOSON 0.4 MG/5ML IV SOLN
0.4000 mg | Freq: Once | INTRAVENOUS | Status: AC
  Administered 2023-05-18: 0.4 mg via INTRAVENOUS

## 2023-05-18 MED ORDER — RUBIDIUM RB82 GENERATOR (RUBYFILL)
20.6000 | PACK | Freq: Once | INTRAVENOUS | Status: AC
  Administered 2023-05-18: 20.6 via INTRAVENOUS

## 2023-05-18 MED ORDER — REGADENOSON 0.4 MG/5ML IV SOLN
INTRAVENOUS | Status: AC
  Filled 2023-05-18: qty 5

## 2023-05-18 MED ORDER — RUBIDIUM RB82 GENERATOR (RUBYFILL)
20.5800 | PACK | Freq: Once | INTRAVENOUS | Status: AC
  Administered 2023-05-18: 20.58 via INTRAVENOUS

## 2023-05-19 LAB — NM PET CT CARDIAC PERFUSION MULTI W/ABSOLUTE BLOODFLOW
MBFR: 2.06
Nuc Rest EF: 37 %
Nuc Stress EF: 31 %
Rest MBF: 0.52 ml/g/min
Rest Nuclear Isotope Dose: 20.6 mCi
ST Depression (mm): 0 mm
Stress MBF: 1.07 ml/g/min
Stress Nuclear Isotope Dose: 20.6 mCi

## 2023-05-20 ENCOUNTER — Other Ambulatory Visit: Payer: Self-pay

## 2023-05-20 MED ORDER — METOPROLOL TARTRATE 25 MG PO TABS: ORAL_TABLET | ORAL | 3 refills | Status: DC

## 2023-05-24 DIAGNOSIS — M542 Cervicalgia: Secondary | ICD-10-CM | POA: Diagnosis not present

## 2023-05-24 DIAGNOSIS — S161XXD Strain of muscle, fascia and tendon at neck level, subsequent encounter: Secondary | ICD-10-CM | POA: Diagnosis not present

## 2023-06-03 ENCOUNTER — Ambulatory Visit (HOSPITAL_COMMUNITY): Payer: Medicare Other | Attending: Cardiology

## 2023-06-03 DIAGNOSIS — I2510 Atherosclerotic heart disease of native coronary artery without angina pectoris: Secondary | ICD-10-CM | POA: Diagnosis not present

## 2023-06-03 DIAGNOSIS — I493 Ventricular premature depolarization: Secondary | ICD-10-CM

## 2023-06-03 LAB — ECHOCARDIOGRAM COMPLETE
Area-P 1/2: 3.21 cm2
Calc EF: 48.5 %
P 1/2 time: 373 ms
S' Lateral: 3.3 cm
Single Plane A2C EF: 57.1 %
Single Plane A4C EF: 39.5 %

## 2023-06-05 NOTE — Progress Notes (Unsigned)
Cardiology Office Note:    Date:  06/09/2023   ID:  Joseph Marsh, DOB [birth year], MRN [id number]  PCP:  Venita Sheffield, MD  Cardiologist:  Little Ishikawa, MD  Electrophysiologist:  None   Referring MD: Venita Sheffield, *   Chief Complaint  Patient presents with   Coronary Artery Disease    History of Present Illness:    Joseph Marsh is a 77 y.o. male with a hx of CAD, hyperlipidemia, prostate cancer, spinal stenosis, bronchiectasis who presents for follow-up.  He was referred by Dr. Celine Mans for evaluation of irregular heart rhythm and CAD.  CT chest 02/2023 showed severe coronary calcifications.  Stress PET on 05/18/2023 showed mild reversible basal anterior/anterolateral perfusion defect consistent with ischemia; myocardial blood flow reserve was overall normal but decreased (1.56) in area of perfusion defect, EF 37% and decreased to 31% with stress (question accuracy of EF due to frequent PVCs that can affect gating), severe coronary calcifications; overall intermediate risk study.  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  Zio patch x 4 days 04/2023 showed 1 episode of NSVT lasting 5 beats, 14 episodes of SVT with longest lasting 25 seconds with average rate 136 bpm, frequent PVCs (9.2% of beats).  Since last clinic visit, he reports he is doing okay.  He denies any chest pain.  He has not been very active due to his knee pain.  Does report had an episode of shortness of breath recently when having intercourse with his wife.  He he denies any lightheadedness or syncope or palpitations.  Denies any lower extremity edema.   Past Medical History:  Diagnosis Date   Basal cell carcinoma (BCC)    BPH (benign prostatic hyperplasia)    Cellulitis    Eczema    History of arthroscopic knee surgery    Hyperlipidemia    Kidney stone    Plantar fasciitis    Prostate cancer  (HCC) 2012   Right shoulder pain    Sinusitis    Spinal stenosis    Squamous cell carcinoma in situ     Past Surgical History:  Procedure Laterality Date   CHOLECYSTECTOMY N/A 05/24/2013   Procedure: LAPAROSCOPIC CHOLECYSTECTOMY;  Surgeon: Mariella Saa, MD;  Location: [facility];  Service: General;  Laterality: N/A;   COLONOSCOPY  1996   polp removal   COLONOSCOPY  08/2020   polpys removed   KNEE SURGERY     LITHOTRIPSY     MRI     sinus cauterization  1970   TONSILLECTOMY      Current Medications: Current Meds  Medication Sig   acetaminophen (TYLENOL) 500 MG tablet Take 1,000 mg by mouth at bedtime.   Ascorbic Acid (VITAMIN C) 1000 MG tablet Take 1,000 mg by mouth daily.    aspirin EC 81 MG tablet Take 1 tablet (81 mg total) by mouth daily. Swallow whole.   atorvastatin (LIPITOR) 20 MG tablet Take 1 tablet (20 mg total) by mouth daily.   Biotin 1000 MCG tablet Take 1,000 mcg by mouth daily.   Cholecalciferol (VITAMIN D-3 PO) Take 2,000 Units by mouth daily. 2   Coenzyme Q10 (CO Q 10) 100 MG CAPS Take 300 mg by mouth daily.   finasteride (PROSCAR) 5 MG tablet Take 5 mg by mouth daily.   fluocinonide-emollient (LIDEX-E) 0.05 % cream Apply 1 application  topically daily as needed (Eczema).   MELATONIN PO Take 10  mg by mouth at bedtime.   metoprolol succinate (TOPROL XL) 25 MG 24 hr tablet Take 1 tablet (25 mg total) by mouth daily.   Multiple Vitamin (MULTIVITAMIN) tablet Take 1 tablet by mouth daily.   Omega-3 Fatty Acids (FISH OIL PO) Take 1,200 mg by mouth daily. Omega 3 700 mg   Turmeric Curcumin 500 MG CAPS Take 500 mg by mouth daily. With ginger power   [DISCONTINUED] Cyanocobalamin (VITAMIN B 12 PO) Take 2,500 mcg by mouth.   [DISCONTINUED] Glucos-Chondroit-Collag-Hyal (GLUCOSAMINE CHONDROIT-COLLAGEN PO) Take 1 capsule by mouth 2 (two) times daily.    [DISCONTINUED] metoprolol tartrate (LOPRESSOR) 25 MG tablet Take 1/2 tablet ( 12.5 mg ) twice a day     Allergies:    Patient has no known allergies.   Social History   Socioeconomic History   Marital status: Married    Spouse name: Not on file   Number of children: 2   Years of education: Not on file   Highest education level: Master's degree (e.g., MA, MS, MEng, MEd, MSW, MBA)  Occupational History    Employer: [employer]  Tobacco Use   Smoking status: Never   Smokeless tobacco: Never  Vaping Use   Vaping status: Never Used  Substance and Sexual Activity   Alcohol use: No   Drug use: No   Sexual activity: Yes  Other Topics Concern   Not on file  Social History Narrative   As of 06/19/2018 [facility] New Patient Packet:      Diet: Fruits, nuts, protein, carbohydrates, sodium, fats, veggies, and some seafood       Caffeine: Average 5 12oz sodas per week       Married, if yes what year: Divorced x 2, married 1970-1999 (1st time) and 2004-2016 (second time)       Do you live in a house, apartment, assisted living, condo, trailer, ect: 3 bed room house, 1 stories, one person       Pets: No      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 No   Do you have difficulty preparing food or eating? No   Do you have difficulty managing your medications? No   Do you have difficulty managing your finances? No   Do you have difficulty affording your medications? No   Social Drivers of Corporate investment banker Strain: Low Risk  (05/02/2023)   Overall Financial Resource Strain (CARDIA)    Difficulty of Paying Living Expenses: Not hard at all  Food Insecurity: No Food Insecurity (05/02/2023)   Hunger Vital Sign    Worried About Running Out of Food in the Last Year: Never true    Ran Out of Food in the Last Year: Never true  Transportation Needs: No Transportation  Needs (05/02/2023)   PRAPARE - Administrator, Civil Service (Medical): No    Lack of Transportation (Non-Medical): No  Physical Activity: Sufficiently Active (05/02/2023)   Exercise Vital Sign    Days of Exercise per Week: 2 days    Minutes of Exercise per Session: 150+ min  Stress: No Stress Concern Present (05/02/2023)   [employer] of Occupational Health - Occupational Stress Questionnaire  Feeling of Stress : Only a little  Recent Concern: Stress - Stress Concern Present (03/08/2023)   [employer] of Occupational Health - Occupational Stress Questionnaire    Feeling of Stress : To some extent  Social Connections: Unknown (05/02/2023)   Social Connection and Isolation Panel [NHANES]    Frequency of Communication with Friends and Family: More than three times a week    Frequency of Social Gatherings with Friends and Family: Patient declined    Attends Religious Services: Patient declined    Database administrator or Organizations: No    Attends Engineer, structural: Never    Marital Status: Married     Family History: The patient's family history includes Alcoholism in his son; Asthma in his nephew; Breast cancer in his sister; Depression in his son; Diverticulitis in his sister and sister; Heart disease in his father; Melanoma in his brother; Osteoporosis in his sister; Other in his mother; Pneumonia in his nephew; Thyroid disease in his sister. There is no history of Colon cancer, Esophageal cancer, Stomach cancer, or Pancreatic cancer.  ROS:   Please see the history of present illness.     All other systems reviewed and are negative.  EKGs/Labs/Other Studies Reviewed:    The following studies were reviewed today:   EKG:   02/23/2023: Sinus rhythm with ventricular bigeminy, rate 73, QTc 430 04/28/23: Sinus bradycardia, rate 54, QTc 400  Recent Labs: 02/24/2023: ALT 20 05/03/2023: TSH 4.49 06/06/2023: BUN 18; Creatinine, Ser 0.84;  Hemoglobin 14.1; Platelets 205; Potassium 4.5; Sodium 142  Recent Lipid Panel    Component Value Date/Time   CHOL 163 02/24/2023 0807   TRIG 146 02/24/2023 0807   HDL 39 (L) 02/24/2023 0807   CHOLHDL 4.2 02/24/2023 0807   VLDL 62 (H) 09/11/2009 0137   LDLCALC 99 02/24/2023 0807    Physical Exam:    VS:  BP 123/60 (BP Location: Left Arm, Patient Position: Sitting, Cuff Size: Normal)   Pulse 71   Ht 5\' 9"  (1.753 m)   Wt 178 lb 12.8 oz (81.1 kg)   SpO2 99%   BMI 26.40 kg/m     Wt Readings from Last 3 Encounters:  06/06/23 178 lb 12.8 oz (81.1 kg)  05/03/23 174 lb 9.6 oz (79.2 kg)  04/28/23 173 lb 3.2 oz (78.6 kg)     GEN:  Well nourished, well developed in no acute distress HEENT: Normal NECK: No JVD; No carotid bruits LYMPHATICS: No lymphadenopathy CARDIAC: Irregular RESPIRATORY:  Clear to auscultation without rales, wheezing or rhonchi  ABDOMEN: Soft, non-tender, non-distended MUSCULOSKELETAL:  No edema; No deformity  SKIN: Warm and dry NEUROLOGIC:  Alert and oriented x 3 PSYCHIATRIC:  Normal affect   ASSESSMENT:    1. Coronary artery disease involving native coronary artery of native heart, unspecified whether angina present   2. Abnormal stress test   3. Frequent PVCs   4. Hyperlipidemia, unspecified hyperlipidemia type   5. Aortic valve insufficiency, etiology of cardiac valve disease unspecified     PLAN:    CAD: CT chest 02/2023 showed severe coronary calcifications.  He is not reporting anginal symptoms but activity has been limited due to knee pain.  Stress PET on 05/18/2023 showed mild reversible basal anterior/anterolateral perfusion defect consistent with ischemia; myocardial blood flow reserve was overall normal but decreased (1.56) and area of perfusion defect, EF 37% and decreased to 31% with stress (question accuracy of EF due to frequent PVCs that can affect gating), severe coronary calcifications;  overall intermediate risk study.  Echocardiogram showed  EF 50 to 55%, low normal RV function, no significant valvular disease, dilated aortic root measuring 41 mm.   -He denies any chest pain but activity has been limited due to knee issues.  Does report recent episode of shortness of breath that could represent anginal equivalent.  Recommend [facility] for further evaluation. Risks and benefits of cardiac catheterization have been discussed with the patient.  These include bleeding, infection, kidney damage, stroke, heart attack, death.  The patient understands these risks and is willing to proceed.    Frequent PVCs: Noted to be in ventricular bigeminy on recent EKG 02/2023.  Zio patch x 4 days 04/2023 showed 1 episode of NSVT lasting 5 beats, 14 episodes of SVT with longest lasting 25 seconds with average rate 136 bpm, frequent PVCs (9.2% of beats). -Started metoprolol 12.5 mg twice daily, will consolidate to Toprol-XL 25 mg daily  Hyperlipidemia: On atorvastatin 10 mg daily, LDL 99 on 02/24/2023.  Increased atorvastatin to 20 mg daily 04/2023 given severe coronary calcifications as above, will recheck lipid panel next month  Aortic regurgitation: Moderate on echocardiogram 05/2023.  Will monitor   RTC in 1 month     Medication Adjustments/Labs and Tests Ordered: Current medicines are reviewed at length with the patient today.  Concerns regarding medicines are outlined above.  Orders Placed This Encounter  Procedures   Basic Metabolic Panel (BMET)   CBC w/Diff/Platelet   LEFT HEART CATHETERIZATION WITH CORONARY ANGIOGRAM   Meds ordered this encounter  Medications   metoprolol succinate (TOPROL XL) 25 MG 24 hr tablet    Sig: Take 1 tablet (25 mg total) by mouth daily.    Dispense:  90 tablet    Refill:  3   aspirin EC 81 MG tablet    Sig: Take 1 tablet (81 mg total) by mouth daily. Swallow whole.    Dispense:  90 tablet    Refill:  3    Patient Instructions  Medication Instructions:  Start Toprol XL 25 mg daily Start Aspirin 81mg   Daily Stop Metoprolol 25 mg daily ' Continue all current medications Continue all current medications *If you need a refill on your cardiac medications before your next appointment, please call your pharmacy*   Lab Work: BMET, cbc today If you have labs (blood work) drawn today and your tests are completely normal, you will receive your results only by: MyChart Message (if you have MyChart) OR A paper copy in the mail If you have any lab test that is abnormal or we need to change your treatment, we will call you to review the results.   Testing/Procedures: Left Heart Cath Schedule for Jan 3 at 10:30. Please be there at 8:30am for this procedure,  [facility] A DEPT OF [place] AT [facility] [address] [place] [address] [place] [address] Dept: [phone] Loc: [phone]  SALAHUDDIN SALDUTTI  06/06/2023  You are scheduled for a Cardiac Catheterization on Friday, January 3 with Dr. Bryan Lemma.  1. Please arrive at the [facility] (Main Entrance A) at [facility]: [address] [place], [place] [address] at 8:30 AM (This time is 2 hour(s) before your procedure to ensure your preparation).   Free valet parking service is available. You will check in at ADMITTING. The support person will be asked to wait in the waiting room.  It is OK to have someone drop you off and come back  when you are ready to be discharged.    Special note: Every effort is made to have your procedure done on time. Please understand that emergencies sometimes delay scheduled procedures.  2. Diet: Do not eat solid foods after midnight.  The patient may have clear liquids until 5am upon the day of the procedure.  3. Labs: labs drawn today  4. Medication instructions in preparation for your procedure:   Contrast Allergy: No   On the morning of your procedure, take your Aspirin 81 mg and any morning medicines NOT listed above.  You  may use sips of water.  5. Plan to go home the same day, you will only stay overnight if medically necessary. 6. Bring a current list of your medications and current insurance cards. 7. You MUST have a responsible person to drive you home. 8. Someone MUST be with you the first 24 hours after you arrive home or your discharge will be delayed. 9. Please wear clothes that are easy to get on and off and wear slip-on shoes.  Thank you for allowing [place] to care for you!   -- [place] [facility]    Follow-Up: At [facility], you and your health needs are our priority.  As part of our continuing mission to provide you with exceptional heart care, we have created designated Provider Care Teams.  These Care Teams include your primary Cardiologist (physician) and Advanced Practice Providers (APPs -  Physician Assistants and Nurse Practitioners) who all work together to provide you with the care you need, when you need it.  We recommend signing up for the patient portal called "MyChart".  Sign up information is provided on this After Visit Summary.  MyChart is used to connect with patients for Virtual Visits (Telemedicine).  Patients are able to view lab/test results, encounter notes, upcoming appointments, etc.  Non-urgent messages can be sent to your provider as well.   To learn more about what you can do with MyChart, go to [email or web address].    Your next appointment:   Jan 9 @11 :20  Provider:   Little Ishikawa, MD     Other Instructions none        Signed, Little Ishikawa, MD  06/09/2023 5:24 PM    [facility]

## 2023-06-06 ENCOUNTER — Ambulatory Visit: Payer: Medicare Other | Attending: Cardiology | Admitting: Cardiology

## 2023-06-06 ENCOUNTER — Encounter: Payer: Self-pay | Admitting: Cardiology

## 2023-06-06 VITALS — BP 123/60 | HR 71 | Ht 69.0 in | Wt 178.8 lb

## 2023-06-06 DIAGNOSIS — I493 Ventricular premature depolarization: Secondary | ICD-10-CM | POA: Insufficient documentation

## 2023-06-06 DIAGNOSIS — D869 Sarcoidosis, unspecified: Secondary | ICD-10-CM

## 2023-06-06 DIAGNOSIS — I2510 Atherosclerotic heart disease of native coronary artery without angina pectoris: Secondary | ICD-10-CM | POA: Insufficient documentation

## 2023-06-06 DIAGNOSIS — E785 Hyperlipidemia, unspecified: Secondary | ICD-10-CM | POA: Diagnosis not present

## 2023-06-06 DIAGNOSIS — I351 Nonrheumatic aortic (valve) insufficiency: Secondary | ICD-10-CM | POA: Diagnosis not present

## 2023-06-06 DIAGNOSIS — R9439 Abnormal result of other cardiovascular function study: Secondary | ICD-10-CM | POA: Insufficient documentation

## 2023-06-06 LAB — CBC WITH DIFFERENTIAL/PLATELET

## 2023-06-06 MED ORDER — ASPIRIN 81 MG PO TBEC: 81.0000 mg | DELAYED_RELEASE_TABLET | Freq: Every day | ORAL | 3 refills | Status: AC

## 2023-06-06 MED ORDER — METOPROLOL SUCCINATE ER 25 MG PO TB24: 25.0000 mg | ORAL_TABLET | Freq: Every day | ORAL | 3 refills | Status: DC

## 2023-06-06 NOTE — Patient Instructions (Addendum)
Medication Instructions:  Start Toprol XL 25 mg daily Start Aspirin 81mg  Daily Stop Metoprolol 25 mg daily ' Continue all current medications Continue all current medications *If you need a refill on your cardiac medications before your next appointment, please call your pharmacy*   Lab Work: BMET, cbc today If you have labs (blood work) drawn today and your tests are completely normal, you will receive your results only by: MyChart Message (if you have MyChart) OR A paper copy in the mail If you have any lab test that is abnormal or we need to change your treatment, we will call you to review the results.   Testing/Procedures: Left Heart Cath Schedule for Jan 3 at 10:30. Please be there at 8:30am for this procedure,  [facility] A DEPT OF [place]. [facility] AT [facility] [address] [place] [address] [place] [address] Dept: [phone] Loc: [phone]  Joseph Marsh  06/06/2023  You are scheduled for a Cardiac Catheterization on Friday, January 3 with Dr. Bryan Lemma.  1. Please arrive at the [facility] (Main Entrance A) at [facility]: [address] [place], [place] [address] at 8:30 AM (This time is 2 hour(s) before your procedure to ensure your preparation).   Free valet parking service is available. You will check in at ADMITTING. The support person will be asked to wait in the waiting room.  It is OK to have someone drop you off and come back when you are ready to be discharged.    Special note: Every effort is made to have your procedure done on time. Please understand that emergencies sometimes delay scheduled procedures.  2. Diet: Do not eat solid foods after midnight.  The patient may have clear liquids until 5am upon the day of the procedure.  3. Labs: labs drawn today  4. Medication instructions in preparation for your procedure:   Contrast Allergy: No   On the morning of your procedure,  take your Aspirin 81 mg and any morning medicines NOT listed above.  You may use sips of water.  5. Plan to go home the same day, you will only stay overnight if medically necessary. 6. Bring a current list of your medications and current insurance cards. 7. You MUST have a responsible person to drive you home. 8. Someone MUST be with you the first 24 hours after you arrive home or your discharge will be delayed. 9. Please wear clothes that are easy to get on and off and wear slip-on shoes.  Thank you for allowing [place] to care for you!   -- [place] [facility]    Follow-Up: At [facility], you and your health needs are our priority.  As part of our continuing mission to provide you with exceptional heart care, we have created designated Provider Care Teams.  These Care Teams include your primary Cardiologist (physician) and Advanced Practice Providers (APPs -  Physician Assistants and Nurse Practitioners) who all work together to provide you with the care you need, when you need it.  We recommend signing up for the patient portal called "MyChart".  Sign up information is provided on this After Visit Summary.  MyChart is used to connect with patients for Virtual Visits (Telemedicine).  Patients are able to view lab/test results, encounter notes, upcoming appointments, etc.  Non-urgent messages can be sent to your provider as well.   To learn more about what you can do with MyChart, go  to [email or web address].    Your next appointment:   Jan 9 @11 :20  Provider:   Little Ishikawa, MD     Other Instructions none

## 2023-06-07 LAB — CBC WITH DIFFERENTIAL/PLATELET
Basos: 1 %
EOS (ABSOLUTE): 0 10*3/uL (ref 0.0–0.2)
Eos: 3 %
Hematocrit: 43.1 % (ref 37.5–51.0)
Hemoglobin: 14.1 g/dL (ref 13.0–17.7)
Immature Granulocytes: 0 %
Immature Granulocytes: 0 10*3/uL (ref 0.0–0.1)
Lymphs: 22 %
MCH: 29 pg (ref 26.6–33.0)
MCHC: 32.7 g/dL (ref 31.5–35.7)
MCV: 89 fL (ref 79–97)
Monocytes Absolute: 0.1 10*3/uL (ref 0.0–0.4)
Monocytes Absolute: 0.6 10*3/uL (ref 0.1–0.9)
Monocytes: 13 %
Neutrophils Absolute: 1.1 10*3/uL (ref 0.7–3.1)
Neutrophils Absolute: 2.9 10*3/uL (ref 1.4–7.0)
Neutrophils: 61 %
Platelets: 205 10*3/uL (ref 150–450)
RBC: 4.86 x10E6/uL (ref 4.14–5.80)
RDW: 12.6 % (ref 11.6–15.4)
WBC: 4.7 10*3/uL (ref 3.4–10.8)

## 2023-06-07 LAB — BASIC METABOLIC PANEL
BUN/Creatinine Ratio: 21 (ref 10–24)
BUN: 18 mg/dL (ref 8–27)
CO2: 24 mmol/L (ref 20–29)
Calcium: 9.3 mg/dL (ref 8.6–10.2)
Chloride: 103 mmol/L (ref 96–106)
Creatinine, Ser: 0.84 mg/dL (ref 0.76–1.27)
Glucose: 104 mg/dL — ABNORMAL HIGH (ref 70–99)
Potassium: 4.5 mmol/L (ref 3.5–5.2)
Sodium: 142 mmol/L (ref 134–144)
eGFR: 89 mL/min/{1.73_m2} (ref >59)

## 2023-06-17 ENCOUNTER — Other Ambulatory Visit: Payer: Self-pay

## 2023-06-17 ENCOUNTER — Ambulatory Visit (HOSPITAL_COMMUNITY)
Admission: RE | Admit: 2023-06-17 | Discharge: 2023-06-17 | Disposition: A | Discharge status: Home / Self Care | Payer: Medicare Other | Attending: Cardiology | Admitting: Cardiology

## 2023-06-17 ENCOUNTER — Encounter (HOSPITAL_COMMUNITY)
Admission: RE | Disposition: A | Discharge status: Home / Self Care | Payer: Self-pay | Source: Home / Self Care | Attending: Cardiology

## 2023-06-17 DIAGNOSIS — I351 Nonrheumatic aortic (valve) insufficiency: Secondary | ICD-10-CM | POA: Diagnosis not present

## 2023-06-17 DIAGNOSIS — R9439 Abnormal result of other cardiovascular function study: Secondary | ICD-10-CM | POA: Diagnosis not present

## 2023-06-17 DIAGNOSIS — I2510 Atherosclerotic heart disease of native coronary artery without angina pectoris: Secondary | ICD-10-CM | POA: Diagnosis not present

## 2023-06-17 DIAGNOSIS — I493 Ventricular premature depolarization: Secondary | ICD-10-CM | POA: Diagnosis not present

## 2023-06-17 DIAGNOSIS — Z79899 Other long term (current) drug therapy: Secondary | ICD-10-CM | POA: Insufficient documentation

## 2023-06-17 DIAGNOSIS — E785 Hyperlipidemia, unspecified: Secondary | ICD-10-CM | POA: Insufficient documentation

## 2023-06-17 DIAGNOSIS — I4729 Other ventricular tachycardia: Secondary | ICD-10-CM | POA: Insufficient documentation

## 2023-06-17 DIAGNOSIS — Z8546 Personal history of malignant neoplasm of prostate: Secondary | ICD-10-CM | POA: Diagnosis not present

## 2023-06-17 DIAGNOSIS — I2584 Coronary atherosclerosis due to calcified coronary lesion: Secondary | ICD-10-CM | POA: Insufficient documentation

## 2023-06-17 HISTORY — PX: LEFT HEART CATH AND CORONARY ANGIOGRAPHY: CATH118249

## 2023-06-17 SURGERY — LEFT HEART CATH AND CORONARY ANGIOGRAPHY
Anesthesia: LOCAL

## 2023-06-17 MED ORDER — LIDOCAINE HCL (PF) 1 % IJ SOLN
INTRAMUSCULAR | Status: AC
  Filled 2023-06-17: qty 30

## 2023-06-17 MED ORDER — FENTANYL CITRATE (PF) 100 MCG/2ML IJ SOLN
INTRAMUSCULAR | Status: AC
  Filled 2023-06-17: qty 2

## 2023-06-17 MED ORDER — LABETALOL HCL 5 MG/ML IV SOLN: 10.0000 mg | INTRAVENOUS | Status: DC | PRN

## 2023-06-17 MED ORDER — ONDANSETRON HCL 4 MG/2ML IJ SOLN: 4.0000 mg | Freq: Four times a day (QID) | INTRAMUSCULAR | Status: DC | PRN

## 2023-06-17 MED ORDER — HEPARIN SODIUM (PORCINE) 1000 UNIT/ML IJ SOLN
INTRAMUSCULAR | Status: DC | PRN
  Administered 2023-06-17: 4000 [IU] via INTRAVENOUS

## 2023-06-17 MED ORDER — SODIUM CHLORIDE 0.9 % WEIGHT BASED INFUSION: 1.0000 mL/kg/h | INTRAVENOUS | Status: DC

## 2023-06-17 MED ORDER — ACETAMINOPHEN 325 MG PO TABS: 650.0000 mg | ORAL_TABLET | ORAL | Status: DC | PRN

## 2023-06-17 MED ORDER — HEPARIN (PORCINE) IN NACL 1000-0.9 UT/500ML-% IV SOLN
INTRAVENOUS | Status: DC | PRN
  Administered 2023-06-17 (×2): 500 mL via INTRA_ARTERIAL

## 2023-06-17 MED ORDER — SODIUM CHLORIDE 0.9% FLUSH: 3.0000 mL | INTRAVENOUS | Status: DC | PRN

## 2023-06-17 MED ORDER — IOHEXOL 350 MG/ML SOLN
INTRAVENOUS | Status: DC | PRN
  Administered 2023-06-17: 50 mL via INTRA_ARTERIAL

## 2023-06-17 MED ORDER — SODIUM CHLORIDE 0.9 % IV SOLN: INTRAVENOUS | Status: DC

## 2023-06-17 MED ORDER — MIDAZOLAM HCL 2 MG/2ML IJ SOLN
INTRAMUSCULAR | Status: AC
  Filled 2023-06-17: qty 2

## 2023-06-17 MED ORDER — HYDRALAZINE HCL 20 MG/ML IJ SOLN: 10.0000 mg | INTRAMUSCULAR | Status: DC | PRN

## 2023-06-17 MED ORDER — MIDAZOLAM HCL 2 MG/2ML IJ SOLN
INTRAMUSCULAR | Status: DC | PRN
  Administered 2023-06-17: 1 mg via INTRAVENOUS

## 2023-06-17 MED ORDER — VERAPAMIL HCL 2.5 MG/ML IV SOLN
INTRAVENOUS | Status: DC | PRN
  Administered 2023-06-17: 10 mL via INTRA_ARTERIAL

## 2023-06-17 MED ORDER — VERAPAMIL HCL 2.5 MG/ML IV SOLN
INTRAVENOUS | Status: AC
  Filled 2023-06-17: qty 2

## 2023-06-17 MED ORDER — ASPIRIN 81 MG PO CHEW: 81.0000 mg | CHEWABLE_TABLET | ORAL | Status: DC

## 2023-06-17 MED ORDER — SODIUM CHLORIDE 0.9% FLUSH: 3.0000 mL | Freq: Two times a day (BID) | INTRAVENOUS | Status: DC

## 2023-06-17 MED ORDER — LIDOCAINE HCL (PF) 1 % IJ SOLN
INTRAMUSCULAR | Status: DC | PRN
  Administered 2023-06-17: 5 mL

## 2023-06-17 MED ORDER — HEPARIN SODIUM (PORCINE) 1000 UNIT/ML IJ SOLN
INTRAMUSCULAR | Status: AC
  Filled 2023-06-17: qty 10

## 2023-06-17 MED ORDER — SODIUM CHLORIDE 0.9 % IV SOLN: 250.0000 mL | INTRAVENOUS | Status: DC | PRN

## 2023-06-17 MED ORDER — FENTANYL CITRATE (PF) 100 MCG/2ML IJ SOLN
INTRAMUSCULAR | Status: DC | PRN
  Administered 2023-06-17: 25 ug via INTRAVENOUS

## 2023-06-17 SURGICAL SUPPLY — 10 items
CATH INFINITI AMBI 5FR TG (CATHETERS) IMPLANT
DEVICE RAD COMP TR BAND LRG (VASCULAR PRODUCTS) IMPLANT
GLIDESHEATH SLEND SS 6F .021 (SHEATH) IMPLANT
GUIDEWIRE INQWIRE 1.5J.035X260 (WIRE) IMPLANT
INQWIRE 1.5J .035X260CM (WIRE) ×1
PACK CARDIAC CATHETERIZATION (CUSTOM PROCEDURE TRAY) ×1 IMPLANT
PROTECTION STATION PRESSURIZED (MISCELLANEOUS) ×1
SET ATX-X65L (MISCELLANEOUS) IMPLANT
SHEATH PROBE COVER 6X72 (BAG) IMPLANT
STATION PROTECTION PRESSURIZED (MISCELLANEOUS) IMPLANT

## 2023-06-17 NOTE — Interval H&P Note (Signed)
 History and Physical Interval Note:  06/17/2023 9:49 AM  Joseph Marsh  has presented today for surgery, with the diagnosis of abn stress.  The various methods of treatment have been discussed with the patient and family. After consideration of risks, benefits and other options for treatment, the patient has consented to  Procedure(s): LEFT HEART CATH AND CORONARY ANGIOGRAPHY (N/A)  PERCUTANEOUS CORONARY INTERVENTION . as a surgical intervention.  The patient's history has been reviewed, patient examined, no change in status, stable for surgery.  I have reviewed the patient's chart and labs.  Questions were answered to the patient's satisfaction.    Cath Lab Visit (complete for each Cath Lab visit)  Clinical Evaluation Leading to the Procedure:   ACS: No.  Non-ACS:    Anginal Classification: No Symptoms  Anti-ischemic medical therapy: Minimal Therapy (1 class of medications)  Non-Invasive Test Results: Intermediate-risk stress test findings: cardiac mortality 1-3%/year  Prior CABG: No previous CABG    Alm [place]

## 2023-06-19 NOTE — Progress Notes (Signed)
 Cardiology Office Note:    Date:  06/23/2023   ID:  Joseph Marsh, DOB [birth year], MRN [id number]  PCP:  Sherlynn Madden, MD  Cardiologist:  Lonni LITTIE Nanas, MD  Electrophysiologist:  None   Referring MD: Sherlynn Madden, *   Chief Complaint  Patient presents with   Coronary Artery Disease    History of Present Illness:    Joseph Marsh is a 78 y.o. male with a hx of CAD, hyperlipidemia, prostate cancer, spinal stenosis, bronchiectasis who presents for follow-up.  He was referred by Dr. Meade for evaluation of irregular heart rhythm and CAD.  CT chest 02/2023 showed severe coronary calcifications.  Stress PET on 05/18/2023 showed mild reversible basal anterior/anterolateral perfusion defect consistent with ischemia; myocardial blood flow reserve was overall normal but decreased (1.56) in area of perfusion defect, EF 37% and decreased to 31% with stress (question accuracy of EF due to frequent PVCs that can affect gating), severe coronary calcifications; overall intermediate risk study.  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  Zio patch x 4 days 04/2023 showed 1 episode of NSVT lasting 5 beats, 14 episodes of SVT with longest lasting 25 seconds with average rate 136 bpm, frequent PVCs (9.2% of beats).  LHC 06/17/2023 showed minimal CAD.  Since last clinic visit, he reports he is doing well.  Does report some dyspnea. Denies any chest pain,  lightheadedness, syncope, lower extremity edema, or palpitations.   Past Medical History:  Diagnosis Date   Basal cell carcinoma (BCC)    BPH (benign prostatic hyperplasia)    Cellulitis    Eczema    History of arthroscopic knee surgery    Hyperlipidemia    Kidney stone    Plantar fasciitis    Prostate cancer (HCC) 2012   Right shoulder pain    Sinusitis    Spinal stenosis    Squamous cell carcinoma in situ     Past Surgical  History:  Procedure Laterality Date   CHOLECYSTECTOMY N/A 05/24/2013   Procedure: LAPAROSCOPIC CHOLECYSTECTOMY;  Surgeon: Morene ONEIDA Olives, MD;  Location: [facility];  Service: General;  Laterality: N/A;   COLONOSCOPY  1996   polp removal   COLONOSCOPY  08/2020   polpys removed   KNEE SURGERY     LEFT HEART CATH AND CORONARY ANGIOGRAPHY N/A 06/17/2023   Procedure: LEFT HEART CATH AND CORONARY ANGIOGRAPHY;  Surgeon: Anner Alm ORN, MD;  Location: [facility] INVASIVE CV LAB;  Service: Cardiovascular;  Laterality: N/A;   LITHOTRIPSY     MRI     sinus cauterization  1970   TONSILLECTOMY      Current Medications: Current Meds  Medication Sig   acetaminophen  (TYLENOL ) 500 MG tablet Take 1,000 mg by mouth at bedtime.   Ascorbic Acid (VITAMIN C) 1000 MG tablet Take 1,000 mg by mouth daily.    aspirin  EC 81 MG tablet Take 1 tablet (81 mg total) by mouth daily. Swallow whole.   atorvastatin  (LIPITOR) 20 MG tablet Take 1 tablet (20 mg total) by mouth daily.   Biotin 1000 MCG tablet Take 1,000 mcg by mouth daily.   Cholecalciferol (VITAMIN D-3 PO) Take 2,000 Units by mouth daily. 2   Coenzyme Q10 (CO Q 10) 100 MG CAPS Take 300 mg by mouth daily.   cyanocobalamin  (VITAMIN B12) 1000 MCG tablet Take 1,000 mcg by mouth daily.   finasteride (PROSCAR) 5 MG tablet Take 5 mg by mouth  daily.   fluocinonide-emollient (LIDEX-E) 0.05 % cream Apply 1 application  topically daily as needed (Eczema).   MELATONIN PO Take 10 mg by mouth at bedtime.   metoprolol  succinate (TOPROL  XL) 25 MG 24 hr tablet Take 1 tablet (25 mg total) by mouth daily.   Misc Natural Products (GLUCOSAMINE CHOND MSM FORMULA PO) Take 1 tablet by mouth daily. 1500 mg /1500 mg   Multiple Vitamin (MULTIVITAMIN) tablet Take 1 tablet by mouth daily.   Omega-3 Fatty Acids (FISH OIL PO) Take 1,200 mg by mouth daily. Omega 3 700 mg   OVER THE COUNTER MEDICATION Take 1 tablet by mouth daily. osteo bi flex 1500 mg /1100 mg   prednisoLONE acetate (PRED  FORTE) 1 % ophthalmic suspension Place 1 drop into the left eye 4 (four) times daily.   Turmeric Curcumin 500 MG CAPS Take 500 mg by mouth daily. With ginger power     Allergies:   Patient has no known allergies.   Social History   Socioeconomic History   Marital status: Married    Spouse name: Not on file   Number of children: 2   Years of education: Not on file   Highest education level: Master's degree (e.g., MA, MS, MEng, MEd, MSW, MBA)  Occupational History    Employer: [employer]  Tobacco Use   Smoking status: Never   Smokeless tobacco: Never  Vaping Use   Vaping status: Never Used  Substance and Sexual Activity   Alcohol use: No   Drug use: No   Sexual activity: Yes  Other Topics Concern   Not on file  Social History Narrative   As of 06/19/2018 [facility] New Patient Packet:      Diet: Fruits, nuts, protein, carbohydrates, sodium, fats, veggies, and some seafood       Caffeine: Average 5 12oz sodas per week       Married, if yes what year: Divorced x 2, married 1970-1999 (1st time) and 2004-2016 (second time)       Do you live in a house, apartment, assisted living, condo, trailer, ect: 3 bed room house, 1 stories, one person       Pets: No      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 No   Do you have difficulty preparing food or eating? No   Do you have difficulty managing your medications? No   Do you have difficulty managing your finances? No   Do you have difficulty affording your medications? No   Social Drivers of Corporate Investment Banker Strain: Low Risk  (05/02/2023)   Overall Financial Resource Strain (CARDIA)    Difficulty of Paying Living Expenses: Not hard at all  Food Insecurity: No Food Insecurity  (05/02/2023)   Hunger Vital Sign    Worried About Running Out of Food in the Last Year: Never true    Ran Out of Food in the Last Year: Never true  Transportation Needs: No Transportation Needs (05/02/2023)   PRAPARE - Administrator, Civil Service (Medical): No    Lack of Transportation (Non-Medical): No  Physical Activity: Sufficiently Active (05/02/2023)   Exercise Vital Sign    Days of  Exercise per Week: 2 days    Minutes of Exercise per Session: 150+ min  Stress: No Stress Concern Present (05/02/2023)   [employer] of Occupational Health - Occupational Stress Questionnaire    Feeling of Stress : Only a little  Recent Concern: Stress - Stress Concern Present (03/08/2023)   [employer] of Occupational Health - Occupational Stress Questionnaire    Feeling of Stress : To some extent  Social Connections: Unknown (05/02/2023)   Social Connection and Isolation Panel [NHANES]    Frequency of Communication with Friends and Family: More than three times a week    Frequency of Social Gatherings with Friends and Family: Patient declined    Attends Religious Services: Patient declined    Database Administrator or Organizations: No    Attends Engineer, Structural: Never    Marital Status: Married     Family History: The patient's family history includes Alcoholism in his son; Asthma in his nephew; Breast cancer in his sister; Depression in his son; Diverticulitis in his sister and sister; Heart disease in his father; Melanoma in his brother; Osteoporosis in his sister; Other in his mother; Pneumonia in his nephew; Thyroid  disease in his sister. There is no history of Colon cancer, Esophageal cancer, Stomach cancer, or Pancreatic cancer.  ROS:   Please see the history of present illness.     All other systems reviewed and are negative.  EKGs/Labs/Other Studies Reviewed:    The following studies were reviewed today:   EKG:   02/23/2023: Sinus rhythm with  ventricular bigeminy, rate 73, QTc 430 04/28/23: Sinus bradycardia, rate 54, QTc 400 06/23/2023: Sinus bradycardia, rate 54, right bundle branch block, QTc 421  Recent Labs: 02/24/2023: ALT 20 05/03/2023: TSH 4.49 06/06/2023: BUN 18; Creatinine, Ser 0.84; Hemoglobin 14.1; Platelets 205; Potassium 4.5; Sodium 142  Recent Lipid Panel    Component Value Date/Time   CHOL 163 02/24/2023 0807   TRIG 146 02/24/2023 0807   HDL 39 (L) 02/24/2023 0807   CHOLHDL 4.2 02/24/2023 0807   VLDL 62 (H) 09/11/2009 0137   LDLCALC 99 02/24/2023 0807    Physical Exam:    VS:  BP 121/65 (BP Location: Left Arm, Patient Position: Sitting, Cuff Size: Normal)   Pulse (!) 54   Ht 5' 9 (1.753 m)   Wt 175 lb (79.4 kg)   SpO2 95%   BMI 25.84 kg/m     Wt Readings from Last 3 Encounters:  06/23/23 175 lb (79.4 kg)  06/17/23 175 lb (79.4 kg)  06/06/23 178 lb 12.8 oz (81.1 kg)     GEN:  Well nourished, well developed in no acute distress HEENT: Normal NECK: No JVD; No carotid bruits LYMPHATICS: No lymphadenopathy CARDIAC: Irregular RESPIRATORY:  Clear to auscultation without rales, wheezing or rhonchi  ABDOMEN: Soft, non-tender, non-distended MUSCULOSKELETAL:  No edema; No deformity  SKIN: Warm and dry NEUROLOGIC:  Alert and oriented x 3 PSYCHIATRIC:  Normal affect   ASSESSMENT:    1. Coronary artery disease involving native coronary artery of native heart, unspecified whether angina present   2. Frequent PVCs   3. Aortic valve insufficiency, etiology of cardiac valve disease unspecified   4. Hyperlipidemia, unspecified hyperlipidemia type      PLAN:    CAD: CT chest 02/2023 showed severe coronary calcifications.  He is not reporting anginal symptoms but activity has been limited due to knee pain.  Stress PET on 05/18/2023 showed mild reversible basal anterior/anterolateral perfusion defect consistent with ischemia; myocardial  blood flow reserve was overall normal but decreased (1.56) in area of  perfusion defect, EF 37% and decreased to 31% with stress (question accuracy of EF due to frequent PVCs that can affect gating), severe coronary calcifications; overall intermediate risk study.  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  LHC 06/17/2023 showed minimal CAD. -Continue aspirin , statin  Frequent PVCs: Noted to be in ventricular bigeminy on recent EKG 02/2023.  Zio patch x 4 days 04/2023 showed 1 episode of NSVT lasting 5 beats, 14 episodes of SVT with longest lasting 25 seconds with average rate 136 bpm, frequent PVCs (9.2% of beats). -Continue Toprol -XL 25 mg daily  Hyperlipidemia: On atorvastatin  10 mg daily, LDL 99 on 02/24/2023.  Increased atorvastatin  to 20 mg daily 04/2023 given severe coronary calcifications as above, will recheck lipid panel  Aortic regurgitation: Moderate on echocardiogram 05/2023.  Will monitor   RTC in 6 months     Medication Adjustments/Labs and Tests Ordered: Current medicines are reviewed at length with the patient today.  Concerns regarding medicines are outlined above.  Orders Placed This Encounter  Procedures   Lipid panel   EKG 12-Lead   No orders of the defined types were placed in this encounter.   Patient Instructions  Medication Instructions:  Continue current medication *If you need a refill on your cardiac medications before your next appointment, please call your pharmacy*   Lab Work: Lipid Panel today If you have labs (blood work) drawn today and your tests are completely normal, you will receive your results only by: MyChart Message (if you have MyChart) OR A paper copy in the mail If you have any lab test that is abnormal or we need to change your treatment, we will call you to review the results.   Testing/Procedures: none   Follow-Up: At [facility], you and your health needs are our priority.  As part of our continuing  mission to provide you with exceptional heart care, we have created designated Provider Care Teams.  These Care Teams include your primary Cardiologist (physician) and Advanced Practice Providers (APPs -  Physician Assistants and Nurse Practitioners) who all work together to provide you with the care you need, when you need it.  We recommend signing up for the patient portal called MyChart.  Sign up information is provided on this After Visit Summary.  MyChart is used to connect with patients for Virtual Visits (Telemedicine).  Patients are able to view lab/test results, encounter notes, upcoming appointments, etc.  Non-urgent messages can be sent to your provider as well.   To learn more about what you can do with MyChart, go to [email or web address].    Your next appointment:   6 month(s)  Provider:   Lonni LITTIE Nanas, MD     Other Instructions none        Signed, Lonni LITTIE Nanas, MD  06/23/2023 4:58 PM    [facility]

## 2023-06-20 ENCOUNTER — Encounter (HOSPITAL_COMMUNITY): Payer: Self-pay | Admitting: Cardiology

## 2023-06-20 DIAGNOSIS — H401121 Primary open-angle glaucoma, left eye, mild stage: Secondary | ICD-10-CM | POA: Diagnosis not present

## 2023-06-23 ENCOUNTER — Encounter: Payer: Self-pay | Admitting: Cardiology

## 2023-06-23 ENCOUNTER — Ambulatory Visit: Payer: Medicare Other | Attending: Cardiology | Admitting: Cardiology

## 2023-06-23 VITALS — BP 121/65 | HR 54 | Ht 69.0 in | Wt 175.0 lb

## 2023-06-23 DIAGNOSIS — E785 Hyperlipidemia, unspecified: Secondary | ICD-10-CM | POA: Diagnosis not present

## 2023-06-23 DIAGNOSIS — I351 Nonrheumatic aortic (valve) insufficiency: Secondary | ICD-10-CM | POA: Diagnosis not present

## 2023-06-23 DIAGNOSIS — I493 Ventricular premature depolarization: Secondary | ICD-10-CM | POA: Diagnosis not present

## 2023-06-23 DIAGNOSIS — I2510 Atherosclerotic heart disease of native coronary artery without angina pectoris: Secondary | ICD-10-CM | POA: Diagnosis not present

## 2023-06-23 NOTE — Patient Instructions (Signed)
 Medication Instructions:  Continue current medication *If you need a refill on your cardiac medications before your next appointment, please call your pharmacy*   Lab Work: Lipid Panel today If you have labs (blood work) drawn today and your tests are completely normal, you will receive your results only by: MyChart Message (if you have MyChart) OR A paper copy in the mail If you have any lab test that is abnormal or we need to change your treatment, we will call you to review the results.   Testing/Procedures: none   Follow-Up: At [facility], you and your health needs are our priority.  As part of our continuing mission to provide you with exceptional heart care, we have created designated Provider Care Teams.  These Care Teams include your primary Cardiologist (physician) and Advanced Practice Providers (APPs -  Physician Assistants and Nurse Practitioners) who all work together to provide you with the care you need, when you need it.  We recommend signing up for the patient portal called MyChart.  Sign up information is provided on this After Visit Summary.  MyChart is used to connect with patients for Virtual Visits (Telemedicine).  Patients are able to view lab/test results, encounter notes, upcoming appointments, etc.  Non-urgent messages can be sent to your provider as well.   To learn more about what you can do with MyChart, go to [email or web address].    Your next appointment:   6 month(s)  Provider:   Lonni LITTIE Nanas, MD     Other Instructions none

## 2023-06-24 LAB — LIPID PANEL
Chol/HDL Ratio: 3.1 {ratio} (ref 0.0–5.0)
Cholesterol, Total: 119 mg/dL (ref 100–199)
HDL: 38 mg/dL — ABNORMAL LOW (ref >39)
LDL Chol Calc (NIH): 52 mg/dL (ref 0–99)
Triglycerides: 172 mg/dL — ABNORMAL HIGH (ref 0–149)
VLDL Cholesterol Cal: 29 mg/dL (ref 5–40)

## 2023-07-01 ENCOUNTER — Encounter: Payer: Self-pay | Admitting: *Deleted

## 2023-08-30 ENCOUNTER — Encounter: Payer: Self-pay | Admitting: Sports Medicine

## 2023-08-30 ENCOUNTER — Ambulatory Visit (INDEPENDENT_AMBULATORY_CARE_PROVIDER_SITE_OTHER): Payer: Medicare Other | Admitting: Sports Medicine

## 2023-08-30 ENCOUNTER — Telehealth: Payer: Self-pay | Admitting: Internal Medicine

## 2023-08-30 VITALS — BP 114/70 | HR 60 | Temp 97.4°F | Resp 16 | Ht 69.0 in | Wt 176.8 lb

## 2023-08-30 DIAGNOSIS — I2583 Coronary atherosclerosis due to lipid rich plaque: Secondary | ICD-10-CM | POA: Diagnosis not present

## 2023-08-30 DIAGNOSIS — C61 Malignant neoplasm of prostate: Secondary | ICD-10-CM | POA: Diagnosis not present

## 2023-08-30 DIAGNOSIS — R351 Nocturia: Secondary | ICD-10-CM | POA: Diagnosis not present

## 2023-08-30 DIAGNOSIS — I2510 Atherosclerotic heart disease of native coronary artery without angina pectoris: Secondary | ICD-10-CM

## 2023-08-30 DIAGNOSIS — E782 Mixed hyperlipidemia: Secondary | ICD-10-CM | POA: Diagnosis not present

## 2023-08-30 DIAGNOSIS — J309 Allergic rhinitis, unspecified: Secondary | ICD-10-CM | POA: Diagnosis not present

## 2023-08-30 DIAGNOSIS — N401 Enlarged prostate with lower urinary tract symptoms: Secondary | ICD-10-CM | POA: Diagnosis not present

## 2023-08-30 MED ORDER — LORATADINE 10 MG PO TABS: 10.0000 mg | ORAL_TABLET | Freq: Every day | ORAL | 11 refills | Status: DC

## 2023-08-30 MED ORDER — FLUTICASONE PROPIONATE 50 MCG/ACT NA SUSP: 2.0000 | Freq: Every day | NASAL | 6 refills | Status: DC

## 2023-08-30 NOTE — Progress Notes (Signed)
 Careteam: Patient Care Team: Venita Sheffield, MD as PCP - General (Internal Medicine) Little Ishikawa, MD as PCP - Cardiology (Cardiology) Ollen Gross, MD as Consulting Physician (Orthopedic Surgery) Donzetta Starch, MD as Consulting Physician (Dermatology) Heloise Purpura, MD as Consulting Physician (Urology) Charlott Holler, MD as Consulting Physician (Pulmonary Disease) Little Ishikawa, MD as Consulting Physician (Cardiology)  PLACE OF SERVICE:  [facility]  Advanced Directive information Does Patient Have a Medical Advance Directive?: No, Would patient like information on creating a medical advance directive?: No - Patient declined  No Known Allergies  Chief Complaint  Patient presents with   Medical Management of Chronic Issues    4 month follow up.    Immunizations    Discuss the need for [employer].      Discussed the use of AI scribe software for clinical note transcription with the patient, who gave verbal consent to proceed.  History of Present Illness   Joseph Marsh [name] is a 78 year old male with coronary artery disease who presents for follow-up    He recently underwent a cardiology evaluation, including a cardiac cath which showed mild obstruction and calcification but no major blockages. He continues to take aspirin and metoprolol for palpitations. No chest pain or shortness of breath, and he is able to perform yard work without issues.  He has a history of prostate issues, getting up two to three times a night to urinate, which he attributes to his prostate. He saw a urologist in September, and his PSA levels have been almost undetectable for years. He experiences minor daytime urinary leakage and uses a washcloth for protection. He takes finasteride for his prostate and was previously on tamsulosin, which has been discontinued. No blood in urine.  He experiences chronic nasal congestion with postnasal drainage, particularly  after eating, and has undergone previous ENT interventions. He does not currently take allergy medications or nasal sprays.  Intermittent knee pain worsens with prolonged activity, but he is not currently taking any medication for it. He does not use a cane and reports that his balance is affected by his knee issue.  He reports a good appetite and stable weight, managing daily activities independently, including driving and managing finances. No issues with mood, dizziness, or falls. He does not experience shortness of breath during yard work and does not bring up phlegm.         Review of Systems:  Review of Systems  Constitutional:  Negative for chills and fever.  HENT:  Negative for congestion, ear pain, sinus pain and sore throat.   Eyes:  Negative for double vision.  Respiratory:  Negative for cough, sputum production and shortness of breath.   Cardiovascular:  Negative for chest pain, palpitations and leg swelling.  Gastrointestinal:  Negative for abdominal pain, heartburn and nausea.  Genitourinary:  Negative for dysuria, frequency and hematuria.  Musculoskeletal:  Positive for joint pain. Negative for falls and myalgias.  Neurological:  Negative for dizziness.   Negative unless indicated in HPI.   Past Medical History:  Diagnosis Date   Basal cell carcinoma (BCC)    BPH (benign prostatic hyperplasia)    Cellulitis    Eczema    History of arthroscopic knee surgery    Hyperlipidemia    Kidney stone    Plantar fasciitis    Prostate cancer (HCC) 2012   Right shoulder pain    Sinusitis    Spinal stenosis    Squamous cell carcinoma in situ  Past Surgical History:  Procedure Laterality Date   CHOLECYSTECTOMY N/A 05/24/2013   Procedure: LAPAROSCOPIC CHOLECYSTECTOMY;  Surgeon: Mariella Saa, MD;  Location: [facility];  Service: General;  Laterality: N/A;   COLONOSCOPY  1996   polp removal   COLONOSCOPY  08/2020   polpys removed   KNEE SURGERY     LEFT HEART CATH AND  CORONARY ANGIOGRAPHY N/A 06/17/2023   Procedure: LEFT HEART CATH AND CORONARY ANGIOGRAPHY;  Surgeon: Marykay Lex, MD;  Location: [facility] INVASIVE CV LAB;  Service: Cardiovascular;  Laterality: N/A;   LITHOTRIPSY     MRI     sinus cauterization  1970   TONSILLECTOMY     Social History:   reports that he has never smoked. He has never used smokeless tobacco. He reports that he does not drink alcohol and does not use drugs.  Family History  Problem Relation Age of Onset   Other Mother        Organ Failure, no specified per [facility] new patient packet    Heart disease Father        died from. Cerebral Hemorrage, smoker and ETOH    Osteoporosis Sister    Diverticulitis Sister    Thyroid disease Sister    Breast cancer Sister    Diverticulitis Sister    Melanoma Brother    Depression Son    Alcoholism Son    Pneumonia Nephew    Asthma Nephew    Colon cancer Neg Hx    Esophageal cancer Neg Hx    Stomach cancer Neg Hx    Pancreatic cancer Neg Hx     Medications: Patient's Medications  New Prescriptions   FLUTICASONE (FLONASE) 50 MCG/ACT NASAL SPRAY    Place 2 sprays into both nostrils daily.   LORATADINE (CLARITIN) 10 MG TABLET    Take 1 tablet (10 mg total) by mouth daily.  Previous Medications   ACETAMINOPHEN (TYLENOL) 500 MG TABLET    Take 1,000 mg by mouth as needed.   ASCORBIC ACID (VITAMIN C) 1000 MG TABLET    Take 1,000 mg by mouth daily.    ASPIRIN EC 81 MG TABLET    Take 1 tablet (81 mg total) by mouth daily. Swallow whole.   ATORVASTATIN (LIPITOR) 20 MG TABLET    Take 1 tablet (20 mg total) by mouth daily.   BIOTIN 1000 MCG TABLET    Take 1,000 mcg by mouth daily.   CHOLECALCIFEROL (VITAMIN D-3 PO)    Take 2,000 Units by mouth daily. 2   COENZYME Q10 (CO Q 10) 100 MG CAPS    Take 300 mg by mouth daily.   CYANOCOBALAMIN (VITAMIN B12) 1000 MCG TABLET    Take 1,000 mcg by mouth daily.   FINASTERIDE (PROSCAR) 5 MG TABLET    Take 5 mg by mouth daily.   FLUOCINONIDE-EMOLLIENT  (LIDEX-E) 0.05 % CREAM    Apply 1 application  topically daily as needed (Eczema).   MELATONIN 10 MG TABS    Take 10 mg by mouth at bedtime.   METOPROLOL SUCCINATE (TOPROL XL) 25 MG 24 HR TABLET    Take 1 tablet (25 mg total) by mouth daily.   MISC NATURAL PRODUCTS (GLUCOSAMINE CHOND MSM FORMULA PO)    Take 1 tablet by mouth daily. 1500 mg /1500 mg   MULTIPLE VITAMIN (MULTIVITAMIN) TABLET    Take 1 tablet by mouth daily.   OMEGA-3 FATTY ACIDS (FISH OIL PO)    Take 1,200 mg by mouth daily. Omega  3 700 mg   OVER THE COUNTER MEDICATION    Take 1 tablet by mouth daily. osteo bi flex 1500 mg /1100 mg   TURMERIC CURCUMIN 500 MG CAPS    Take 500 mg by mouth daily. With ginger power  Modified Medications   No medications on file  Discontinued Medications   MELATONIN PO    Take 10 mg by mouth at bedtime.   PREDNISOLONE ACETATE (PRED FORTE) 1 % OPHTHALMIC SUSPENSION    Place 1 drop into the left eye 4 (four) times daily.    Physical Exam: Vitals:   08/30/23 1343  BP: 114/70  Pulse: 60  Resp: 16  Temp: (!) 97.4 F (36.3 C)  SpO2: 98%  Weight: 176 lb 12.8 oz (80.2 kg)  Height: 5\' 9"  (1.753 m)   Body mass index is 26.11 kg/m. BP Readings from Last 3 Encounters:  08/30/23 114/70  06/23/23 121/65  06/17/23 108/72   Wt Readings from Last 3 Encounters:  08/30/23 176 lb 12.8 oz (80.2 kg)  06/23/23 175 lb (79.4 kg)  06/17/23 175 lb (79.4 kg)    Physical Exam Constitutional:      Appearance: Normal appearance.  HENT:     Head: Normocephalic and atraumatic.  Cardiovascular:     Rate and Rhythm: Normal rate and regular rhythm.     Pulses: Normal pulses.     Heart sounds: Normal heart sounds.  Pulmonary:     Effort: No respiratory distress.     Breath sounds: No stridor. No wheezing or rales.  Abdominal:     General: Bowel sounds are normal. There is no distension.     Palpations: Abdomen is soft.     Tenderness: There is no abdominal tenderness. There is no right CVA tenderness or  guarding.  Musculoskeletal:        General: No swelling.  Neurological:     Mental Status: He is alert. Mental status is at baseline.     Motor: No weakness.     Labs reviewed: Basic Metabolic Panel: Recent Labs    02/24/23 0807 05/03/23 1531 06/06/23 1043  NA 139  --  142  K 4.2  --  4.5  CL 103  --  103  CO2 28  --  24  GLUCOSE 119*  --  104*  BUN 17  --  18  CREATININE 0.88  --  0.84  CALCIUM 8.9  --  9.3  TSH 7.05* 4.49  --    Liver Function Tests: Recent Labs    02/24/23 0807  AST 16  ALT 20  BILITOT 0.4  PROT 6.5   No results for input(s): "LIPASE", "AMYLASE" in the last 8760 hours. No results for input(s): "AMMONIA" in the last 8760 hours. CBC: Recent Labs    02/24/23 0807 06/06/23 1043  WBC 4.3 4.7  NEUTROABS 2,636 2.9  HGB 14.4 14.1  HCT 43.9 43.1  MCV 88.3 89  PLT 197 205   Lipid Panel: Recent Labs    02/24/23 0807 06/23/23 1437  CHOL 163 119  HDL 39* 38*  LDLCALC 99 52  TRIG 146 172*  CHOLHDL 4.2 3.1   TSH: Recent Labs    02/24/23 0807 05/03/23 1531  TSH 7.05* 4.49   A1C: Lab Results  Component Value Date   HGBA1C 5.9 (H) 01/19/2022    Assessment and Plan     1. Coronary artery disease due to lipid rich plaque (Primary) Stable Cont with aspirin, lipitor Cont with metoprolol with palpitations  2. Mixed hyperlipidemia Cont with lipitor  3. Prostate cancer [facility]) Follow up with urology   4. Benign prostatic hyperplasia with nocturia Cont with finasteride  5. Allergic rhinitis, unspecified seasonality, unspecified trigger C/o post nasal drip - fluticasone (FLONASE) 50 MCG/ACT nasal spray; Place 2 sprays into both nostrils daily.  Dispense: 16 g; Refill: 6 - loratadine (CLARITIN) 10 MG tablet; Take 1 tablet (10 mg total) by mouth daily.  Dispense: 30 tablet; Refill: 11          Return in about 5 months (around 01/30/2024).:

## 2023-08-30 NOTE — Telephone Encounter (Signed)
 Patient is unsure if he is in need of a CT before his next appointment. I did not see anything in his AVS or Active Requests about a CT. Please call and advise so patient will know if it is needed. [phone]

## 2023-08-31 NOTE — Telephone Encounter (Signed)
 ATC patient x1.  LVM for patient to return call.  Patient had scan 6 months ago and no mention of scan prior to his f/u on 4/8.  Will await return call.

## 2023-09-07 NOTE — Telephone Encounter (Signed)
 I spoke with the pt and notified no mention about needing CT prior to upcoming appt  Pt verbalized understanding  Nothing further needed

## 2023-09-07 NOTE — Telephone Encounter (Signed)
ATC x2.  LVM to return call.

## 2023-09-07 NOTE — Telephone Encounter (Signed)
 PT ret Heather's call. Please try again.

## 2023-09-15 ENCOUNTER — Ambulatory Visit (HOSPITAL_BASED_OUTPATIENT_CLINIC_OR_DEPARTMENT_OTHER): Admitting: Internal Medicine

## 2023-09-15 DIAGNOSIS — J479 Bronchiectasis, uncomplicated: Secondary | ICD-10-CM | POA: Diagnosis not present

## 2023-09-15 LAB — PULMONARY FUNCTION TEST
DL/VA % pred: 101 %
DL/VA: 3.98 ml/min/mmHg/L
DLCO cor % pred: 98 %
DLCO cor: 24.25 ml/min/mmHg
DLCO unc % pred: 98 %
DLCO unc: 24.25 ml/min/mmHg
FEF 25-75 Post: 1.35 L/s
FEF 25-75 Pre: 1.24 L/s
FEF2575-%Change-Post: 8 %
FEF2575-%Pred-Post: 66 %
FEF2575-%Pred-Pre: 61 %
FEV1-%Change-Post: 31 %
FEV1-%Pred-Post: 85 %
FEV1-%Pred-Pre: 64 %
FEV1-Post: 2.47 L
FEV1-Pre: 1.89 L
FEV1FVC-%Change-Post: 31 %
FEV1FVC-%Pred-Pre: 68 %
FEV6-%Change-Post: -3 %
FEV6-%Pred-Post: 97 %
FEV6-%Pred-Pre: 100 %
FEV6-Post: 3.69 L
FEV6-Pre: 3.82 L
FEV6FVC-%Change-Post: -1 %
FEV6FVC-%Pred-Post: 105 %
FEV6FVC-%Pred-Pre: 107 %
FVC-%Change-Post: 0 %
FVC-%Pred-Post: 93 %
FVC-%Pred-Pre: 93 %
FVC-Post: 3.8 L
FVC-Pre: 3.82 L
Post FEV1/FVC ratio: 65 %
Post FEV6/FVC ratio: 98 %
Pre FEV1/FVC ratio: 49 %
Pre FEV6/FVC Ratio: 100 %
RV % pred: 146 %
RV: 3.86 L
TLC % pred: 112 %
TLC: 7.93 L

## 2023-09-15 NOTE — Progress Notes (Signed)
 Full PFT Performed Today

## 2023-09-15 NOTE — Patient Instructions (Signed)
 Full PFT Performed Today

## 2023-09-16 ENCOUNTER — Other Ambulatory Visit

## 2023-09-16 DIAGNOSIS — A310 Pulmonary mycobacterial infection: Secondary | ICD-10-CM | POA: Diagnosis not present

## 2023-09-20 ENCOUNTER — Ambulatory Visit: Admitting: Internal Medicine

## 2023-09-20 ENCOUNTER — Encounter: Payer: Self-pay | Admitting: Internal Medicine

## 2023-09-20 VITALS — BP 120/76 | HR 56 | Ht 70.0 in | Wt 177.2 lb

## 2023-09-20 DIAGNOSIS — J479 Bronchiectasis, uncomplicated: Secondary | ICD-10-CM | POA: Diagnosis not present

## 2023-09-20 DIAGNOSIS — J310 Chronic rhinitis: Secondary | ICD-10-CM | POA: Diagnosis not present

## 2023-09-20 DIAGNOSIS — A310 Pulmonary mycobacterial infection: Secondary | ICD-10-CM

## 2023-09-20 NOTE — Patient Instructions (Addendum)
 It was a pleasure to see you today!  Please schedule follow up scheduled with myself in 6 months.  If my schedule is not open yet, we will contact you with a reminder closer to that time. Please call [phone] if you haven't heard from [place] a month before, and always call us sooner if issues or concerns arise. You can also send [place] a message through MyChart, but but aware that this is not to be used for urgent issues and it may take up to 5-7 days to receive a reply. Please be aware that you will likely be able to view your results before I have a chance to respond to them. Please give [place] 5 business days to respond to any non-urgent results.    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  Please bring me sputum samples if you're able to.  If the bacteria keeps growing in your lungs we may need to consider airway clearance or referral to infectious disease specialist.   Continue the claritin and fluticasone nasal spray.  I would advocate for watchful waiting with scans and monitoring your symptoms at this time before jumping into procedures and prolonged courses of antibiotics. Tell me if you have a change in your respiratory symptoms.   Please bring me a sputum sample so we can test it for AFB to see if the MAI infection is in there.   Immunoglobulin levels normal. Breathing testing a little difficult to interpret due to coughing during the testing. Exam is reassuring.  Flonase - 1 spray on each side of your nose twice a day for first week, then 1 spray on each side.   Instructions for use: If you also use a saline nasal spray or rinse, use that first. Position the head with the chin slightly tucked. Use the right hand to spray into the left nostril and the right hand to spray into the left nostril.   Point the bottle away from the septum of your nose (cartilage that divides the two sides of your nose).  Hold the nostril closed on the  opposite side from where you will spray Spray once and gently sniff to pull the medicine into the higher parts of your nose.  Don't sniff too hard as the medicine will drain down the back of your throat instead. Repeat with a second spray on the same side if prescribed. Repeat on the other side of your nose.

## 2023-09-20 NOTE — Progress Notes (Signed)
 Joseph Marsh    [id number]    1945-08-01  Primary Care Physician:Veludandi, Elvis Coil, MD Date of Appointment: 09/20/2023 Established Patient Visit  Chief complaint:   Chief Complaint  Patient presents with   Follow-up    Pt stated that he is doing well    HPI: Joseph Marsh is a 78 y.o. gentleman here for new patient evaluation of abnormal CT Chest and subsequent PET scan. History of prostate cancer treated in 2012 with radiation and brachytherapy. Had an MRI done for follow up.  Part of his surveillance for prostate cancer showed a kidney lesion which is being followed. There were some changes on his lungs that were concerning which are being followed by CT Chest and then dedicated PET scan. This led to discovery of pulmonary nodules. Notes several episodes of pneumonia, most recently in December 2020, treated with outpatient antibiotics.   Sputum culture August 2024 grew MAC.   Interval Updates: Here for follow up.   Hasn't been able to bring up any more sputum samples.  Denies dyspnea that is life limiting.   Does have chronic nasal drainage and throat clearing  Saw cardiology after he saw me - ended up having LHC which showed no flow limiting CAD. 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  Smoking history: never smoker, passive smoke exposure in childhood.   I have reviewed the patient's family social and past medical history and updated as appropriate.   Past Medical History:  Diagnosis Date   Basal cell carcinoma (BCC)    BPH (benign prostatic hyperplasia)    Cellulitis    Eczema    History of arthroscopic knee surgery    Hyperlipidemia    Kidney stone    Plantar fasciitis    Prostate cancer (HCC) 2012   Right shoulder pain    Sinusitis    Spinal stenosis    Squamous cell  carcinoma in situ     Past Surgical History:  Procedure Laterality Date   CHOLECYSTECTOMY N/A 05/24/2013   Procedure: LAPAROSCOPIC CHOLECYSTECTOMY;  Surgeon: Mariella Saa, MD;  Location: [facility];  Service: General;  Laterality: N/A;   COLONOSCOPY  1996   polp removal   COLONOSCOPY  08/2020   polpys removed   KNEE SURGERY     LEFT HEART CATH AND CORONARY ANGIOGRAPHY N/A 06/17/2023   Procedure: LEFT HEART CATH AND CORONARY ANGIOGRAPHY;  Surgeon: Marykay Lex, MD;  Location: [facility] INVASIVE CV LAB;  Service: Cardiovascular;  Laterality: N/A;   LITHOTRIPSY     MRI     sinus cauterization  1970   TONSILLECTOMY      Family History  Problem Relation Age of Onset   Other Mother        Organ Failure, no specified per [facility] new patient packet    Heart disease Father        died from. Cerebral Hemorrage, smoker and ETOH    Osteoporosis Sister    Diverticulitis Sister    Thyroid disease Sister    Breast cancer Sister    Diverticulitis Sister    Melanoma Brother    Depression Son    Alcoholism Son    Pneumonia Nephew    Asthma Nephew    Colon cancer Neg Hx    Esophageal cancer Neg Hx    Stomach cancer  Neg Hx    Pancreatic cancer Neg Hx     Social History   Occupational History    Employer: [employer]  Tobacco Use   Smoking status: Never   Smokeless tobacco: Never  Vaping Use   Vaping status: Never Used  Substance and Sexual Activity   Alcohol use: No   Drug use: No   Sexual activity: Yes     Physical Exam: Blood pressure 120/76, pulse (!) 56, height 5\' 10"  (1.778 m), weight 177 lb 3.2 oz (80.4 kg), SpO2 95%.  Gen:      No acute distress Lungs:    No increased respiratory effort, symmetric chest wall excursion, clear to auscultation bilaterally, no wheezes or crackles CV:         irregular, HR in 50s   Data Reviewed: Imaging: CT Chest reviewed 02/14/2022 - moderate bronchiectasis - multiple bilateral pulmonary nodules waxing and waning, consistent with atypical  infection.   Labs/micro August 2024 - sputum sample normal flora August 2024 - MAC positive  Labs: Lab Results  Component Value Date   NA 142 06/06/2023   K 4.5 06/06/2023   CO2 24 06/06/2023   GLUCOSE 104 (H) 06/06/2023   BUN 18 06/06/2023   CREATININE 0.84 06/06/2023   CALCIUM 9.3 06/06/2023   EGFR 89 06/06/2023   GFRNONAA 77 01/03/2020   Lab Results  Component Value Date   WBC 4.7 06/06/2023   HGB 14.1 06/06/2023   HCT 43.1 06/06/2023   MCV 89 06/06/2023   PLT 205 06/06/2023   Immunoglobulins WNL  Immunization status: Immunization History  Administered Date(s) Administered   Fluad Quad(high Dose 65+) 03/12/2020   Fluad Trivalent(High Dose 65+) 02/23/2023   Influenza, High Dose Seasonal PF 02/15/2019, 04/15/2022   Influenza-Unspecified 04/16/2013, 06/24/2014, 06/19/2015, 06/28/2016, 04/14/2017, 03/14/2018, 05/19/2018   Moderna Covid-19 Vaccine Bivalent Booster 59yrs & up 04/15/2022   PFIZER(Purple Top)SARS-COV-2 Vaccination 07/20/2019, 08/15/2019, 04/11/2020, 11/21/2020   Pneumococcal Conjugate-13 07/23/2014   Pneumococcal Polysaccharide-23 12/18/2009   Pneumococcal-Unspecified 06/14/2016   Tdap 03/01/2011, 03/09/2022   Zoster Recombinant(Shingrix) 06/30/2018, 09/05/2018   Zoster, Live 03/01/2011    External Records Personally Reviewed: cardiology  Assessment:  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  Please bring me sputum samples if you're able to.  If the bacteria keeps growing in your lungs we may need to consider airway clearance or referral to infectious disease specialist.   Continue the claritin and fluticasone nasal spray.  I would advocate for watchful waiting with scans and monitoring your symptoms at this time before jumping  into procedures and prolonged courses of antibiotics. Tell me if you have a change in your respiratory symptoms.   Please bring me a sputum sample so we can test it for AFB to see if the MAI infection is in there.   Immunoglobulin levels normal. Breathing testing a little difficult to interpret due to coughing during the testing. Exam is reassuring.  Return to Care: Return in about 6 months (around 03/21/2024).   Durel Salts, MD Pulmonary and Critical Care Medicine [facility] [phone]

## 2023-09-30 DIAGNOSIS — Z23 Encounter for immunization: Secondary | ICD-10-CM | POA: Diagnosis not present

## 2023-10-05 LAB — AFB IDENTIFICATION BY PCR
M avium complex: POSITIVE — AB
M tuberculosis complex: NEGATIVE

## 2023-10-05 LAB — SPECIMEN STATUS REPORT

## 2023-10-05 LAB — AFB CULTURE WITH SMEAR (NOT AT ARMC)
Acid Fast Culture: POSITIVE — AB
Acid Fast Smear: NEGATIVE

## 2023-10-13 DIAGNOSIS — L738 Other specified follicular disorders: Secondary | ICD-10-CM | POA: Diagnosis not present

## 2023-10-13 DIAGNOSIS — D485 Neoplasm of uncertain behavior of skin: Secondary | ICD-10-CM | POA: Diagnosis not present

## 2023-10-13 DIAGNOSIS — L723 Sebaceous cyst: Secondary | ICD-10-CM | POA: Diagnosis not present

## 2023-10-13 DIAGNOSIS — L570 Actinic keratosis: Secondary | ICD-10-CM | POA: Diagnosis not present

## 2023-10-13 DIAGNOSIS — D0421 Carcinoma in situ of skin of right ear and external auricular canal: Secondary | ICD-10-CM | POA: Diagnosis not present

## 2023-10-13 DIAGNOSIS — Z85828 Personal history of other malignant neoplasm of skin: Secondary | ICD-10-CM | POA: Diagnosis not present

## 2023-10-20 DIAGNOSIS — H40011 Open angle with borderline findings, low risk, right eye: Secondary | ICD-10-CM | POA: Diagnosis not present

## 2023-10-20 DIAGNOSIS — H401121 Primary open-angle glaucoma, left eye, mild stage: Secondary | ICD-10-CM | POA: Diagnosis not present

## 2023-10-20 DIAGNOSIS — H00021 Hordeolum internum right upper eyelid: Secondary | ICD-10-CM | POA: Diagnosis not present

## 2023-11-01 ENCOUNTER — Other Ambulatory Visit: Payer: Self-pay

## 2023-11-01 ENCOUNTER — Encounter (HOSPITAL_COMMUNITY): Payer: Self-pay

## 2023-11-01 ENCOUNTER — Emergency Department (HOSPITAL_COMMUNITY)
Admission: EM | Admit: 2023-11-01 | Discharge: 2023-11-01 | Disposition: A | Discharge status: Home / Self Care | Attending: Emergency Medicine | Admitting: Emergency Medicine

## 2023-11-01 DIAGNOSIS — H1131 Conjunctival hemorrhage, right eye: Secondary | ICD-10-CM | POA: Insufficient documentation

## 2023-11-01 DIAGNOSIS — Z7982 Long term (current) use of aspirin: Secondary | ICD-10-CM | POA: Diagnosis not present

## 2023-11-01 DIAGNOSIS — H40051 Ocular hypertension, right eye: Secondary | ICD-10-CM | POA: Insufficient documentation

## 2023-11-01 DIAGNOSIS — Z85828 Personal history of other malignant neoplasm of skin: Secondary | ICD-10-CM | POA: Insufficient documentation

## 2023-11-01 DIAGNOSIS — H0011 Chalazion right upper eyelid: Secondary | ICD-10-CM | POA: Diagnosis not present

## 2023-11-01 DIAGNOSIS — H5789 Other specified disorders of eye and adnexa: Secondary | ICD-10-CM | POA: Diagnosis not present

## 2023-11-01 MED ORDER — FLUORESCEIN SODIUM 1 MG OP STRP
1.0000 | ORAL_STRIP | Freq: Once | OPHTHALMIC | Status: AC
  Administered 2023-11-01: 1 via OPHTHALMIC
  Filled 2023-11-01: qty 1

## 2023-11-01 MED ORDER — TIMOLOL MALEATE 0.5 % OP SOLN
1.0000 [drp] | Freq: Two times a day (BID) | OPHTHALMIC | Status: DC
  Administered 2023-11-01: 1 [drp] via OPHTHALMIC
  Filled 2023-11-01: qty 5

## 2023-11-01 MED ORDER — TETRACAINE HCL 0.5 % OP SOLN
2.0000 [drp] | Freq: Once | OPHTHALMIC | Status: AC
  Administered 2023-11-01: 2 [drp] via OPHTHALMIC
  Filled 2023-11-01: qty 4

## 2023-11-01 NOTE — Discharge Instructions (Addendum)
 It was a pleasure taking part in your care.  As discussed, please follow-up with Dr. McCuen this morning.  Please call her office and make an appointment to be seen this morning.  Please call her office upon opening at 8 AM.  Please apply timolol drops to right eye twice a day, 1 drop, for 7 days.  You received your first drop here.  Return to the ED with any new or worsening symptoms.  Read attached guide concerning subconjunctival hemorrhage.

## 2023-11-01 NOTE — ED Notes (Signed)
 Patient discharged in stable condition, education materials explained including, follow up, any prescriptions and reasons to return. Patient voiced agreement to education and discharge material.

## 2023-11-01 NOTE — ED Triage Notes (Signed)
 Pt arrived from home via POV c/o right eye problem in which pt poked himself while administering ointment. Scalera  noted to be bright red and swollen past the eye lid.

## 2023-11-01 NOTE — ED Provider Notes (Signed)
 [place] EMERGENCY DEPARTMENT AT [facility] Provider Note   CSN: [id number] Arrival date & time: 11/01/23  0119     History  Chief Complaint  Patient presents with   Eye Problem    Joseph Marsh is a 78 y.o. male with medical history to include basal cell carcinoma, squamous cell carcinoma in situ, spinal stenosis, hyperlipidemia, eczema.  Patient presents to the ED for evaluation of redness to right eye.  Reports that he currently has a chalazion.  Reports that he is currently utilizing neomycin cream for this.  States that tonight he was applying neomycin cream to his upper eyelid when his hand slipped and the metal tube applicator struck his right eye.  He reports that he began having bleeding into his right eye at this time.  States it is aggressively worsened since onset.  Denies any blurred vision, pain in his eye.  Denies any drainage from his eye.  Reports he is set to see ophthalmology on Wednesday, in 2 days, for follow-up appointment.  Also you are concerned about lesion to left wrist.  Reports he noticed this 2 to 3 days ago.  States that he has history of squama cell carcinoma.  Reports he is currently being seen by a dermatologist.   Eye Problem Associated symptoms: redness        Home Medications Prior to Admission medications   Medication Sig Start Date End Date Taking? Authorizing Provider  acetaminophen  (TYLENOL ) 500 MG tablet Take 1,000 mg by mouth as needed.    [provider]  Ascorbic Acid (VITAMIN C) 1000 MG tablet Take 1,000 mg by mouth daily.     [provider]  aspirin  EC 81 MG tablet Take 1 tablet (81 mg total) by mouth daily. Swallow whole. 06/06/23   Wendie Hamburg, MD  atorvastatin  (LIPITOR) 20 MG tablet Take 1 tablet (20 mg total) by mouth daily. 04/28/23 08/30/23  Wendie Hamburg, MD  Biotin 1000 MCG tablet Take 1,000 mcg by mouth daily.    [provider]  Cholecalciferol (VITAMIN D-3 PO)  Take 2,000 Units by mouth daily. 2    [provider]  Coenzyme Q10 (CO Q 10) 100 MG CAPS Take 300 mg by mouth daily.    [provider]  cyanocobalamin  (VITAMIN B12) 1000 MCG tablet Take 1,000 mcg by mouth daily.    [provider]  finasteride (PROSCAR) 5 MG tablet Take 5 mg by mouth daily.    [provider]  fluocinonide-emollient (LIDEX-E) 0.05 % cream Apply 1 application  topically daily as needed (Eczema).    [provider]  fluticasone  (FLONASE ) 50 MCG/ACT nasal spray Place 2 sprays into both nostrils daily. 08/30/23   Tye Gall, MD  loratadine  (CLARITIN ) 10 MG tablet Take 1 tablet (10 mg total) by mouth daily. 08/30/23   Tye Gall, MD  Melatonin 10 MG TABS Take 10 mg by mouth at bedtime.    [provider]  metoprolol  succinate (TOPROL  XL) 25 MG 24 hr tablet Take 1 tablet (25 mg total) by mouth daily. 06/06/23   Wendie Hamburg, MD  Misc Natural Products (GLUCOSAMINE CHOND MSM FORMULA PO) Take 1 tablet by mouth daily. 1500 mg /1500 mg    [provider]  Multiple Vitamin (MULTIVITAMIN) tablet Take 1 tablet by mouth daily.    [provider]  Omega-3 Fatty Acids (FISH OIL PO) Take 1,200 mg by mouth daily. Omega 3 700 mg    [provider]  OVER THE COUNTER MEDICATION Take 1 tablet by mouth daily. osteo bi flex 1500 mg /1100 mg    [provider]  Turmeric Curcumin 500 MG CAPS Take 500 mg by mouth daily. With ginger power    [provider]      Allergies    Patient has no known allergies.    Review of Systems   Review of Systems  Eyes:  Positive for redness. Negative for pain and visual disturbance.    Physical Exam Updated Vital Signs BP 128/63 (BP Location: Right Arm)   Pulse (!) 51   Temp 98 F (36.7 C)   Resp 16   Ht 5\' 9"  (1.753 m)   Wt 79.4 kg   SpO2 97%   BMI 25.84 kg/m  Physical Exam Vitals and nursing note reviewed.  Constitutional:       General: He is not in acute distress.    Appearance: He is well-developed.  HENT:     Head: Normocephalic and atraumatic.  Eyes:     General:        Right eye: No discharge.        Left eye: No discharge.     Extraocular Movements: Extraocular movements intact.     Conjunctiva/sclera:     Right eye: Hemorrhage present.     Pupils: Pupils are equal, round, and reactive to light.     Comments: Hemorrhage in right eye consistent with subconjunctival hemorrhage.  Pupil PERRL.  Tracks across the midline, EOMs intact nonpainful.  Cardiovascular:     Rate and Rhythm: Normal rate and regular rhythm.     Heart sounds: No murmur heard. Pulmonary:     Effort: Pulmonary effort is normal. No respiratory distress.     Breath sounds: Normal breath sounds.  Abdominal:     Palpations: Abdomen is soft.     Tenderness: There is no abdominal tenderness.  Musculoskeletal:        General: No swelling.     Cervical back: Neck supple.     Comments: 0.5 x 1 cm lesion to right wrist.  No erythema, fluctuance.  Skin:    General: Skin is warm and dry.     Capillary Refill: Capillary refill takes less than 2 seconds.  Neurological:     Mental Status: He is alert.  Psychiatric:        Mood and Affect: Mood normal.          ED Results / Procedures / Treatments   Labs (all labs ordered are listed, but only abnormal results are displayed) Labs Reviewed - No data to display  EKG None  Radiology No results found.  Procedures Procedures    Medications Ordered in ED Medications  timolol (TIMOPTIC) 0.5 % ophthalmic solution 1 drop (1 drop Right Eye Given 11/01/23 0338)  tetracaine (PONTOCAINE) 0.5 % ophthalmic solution 2 drop (2 drops Right Eye Given 11/01/23 0223)  fluorescein ophthalmic strip 1 strip (1 strip Right Eye Given 11/01/23 0224)    ED Course/ Medical Decision Making/ A&P  Medical Decision Making Risk Prescription drug management.   78 year old male presents for  evaluation.  Please see HPI for further details.  On exam patient afebrile and nontachycardic.  Lung sounds are clear bilaterally, nonhypoxic.  Abdomen soft and compressible.  Neurological examination at baseline.  Patient right eye with obvious subconjunctival hemorrhage.  Pupils PERRL.  EOMs intact and nonpainful.  Fluorescein stain conducted.  No dye uptake noted.  Negative Seidel sign.  Patient right eye  then assessed with Tono-Pen.  Pressures found to be between 38 and 40 on 2 different Tono-Pen's.  Discussed with ophthalmology, Dr. McCuen, who advises starting patient on timolol drops and she states she will see him in the office this morning.  Advised patient of plan and he voiced understanding.  Patient provided with timolol drops here in the department and given follow-up information with Dr. McCuen.  Patient voiced understanding with instructions.  Discharged home.   Final Clinical Impression(s) / ED Diagnoses Final diagnoses:  Subconjunctival hemorrhage of right eye  Raised intraocular pressure of right eye    Rx / DC Orders ED Discharge Orders     None         Adel Aden, PA-C 11/01/23 0345    Ballard Bongo, MD 11/02/23 [phone]

## 2023-11-02 DIAGNOSIS — H00021 Hordeolum internum right upper eyelid: Secondary | ICD-10-CM | POA: Diagnosis not present

## 2023-11-03 ENCOUNTER — Encounter: Payer: Self-pay | Admitting: Cardiology

## 2023-11-04 ENCOUNTER — Telehealth: Payer: Self-pay

## 2023-11-04 ENCOUNTER — Ambulatory Visit: Payer: Self-pay | Admitting: Internal Medicine

## 2023-11-04 DIAGNOSIS — A310 Pulmonary mycobacterial infection: Secondary | ICD-10-CM

## 2023-11-04 NOTE — Telephone Encounter (Unsigned)
 Copied from CRM [id number]. Topic: General - Other >> Nov 04, 2023 11:45 AM Ambrose Junk wrote: Reason for CRM: Patient just sent MyChart message to Dr Dione Franks. Patient also has questions regarding MyChart request to complete test and confused about request. Need some one to talk with him regarding testing.

## 2023-11-05 NOTE — Telephone Encounter (Signed)
I don't see a mychart message?

## 2023-11-08 NOTE — Telephone Encounter (Signed)
 The msg was in a results followup encounter and Dr Dione Franks already responded to the pt:  Joseph Hurdle, MD to Joseph Marsh [name]     11/05/23  6:25 PM Hi [name], this is consistent with what we suspected, and is the same bacteria that grew a few months ago. No need to bring up additional sputum for now. I am going to refer you to infectious disease doctors based with this information so we can determine if it makes sense to treat this bacteria. - Dr Dione Franks  Last read by [name] [name] at 10:03AM on 11/08/2023. AFB Culture & Smear; AFB Identification by PCR; Specimen status report  Closing encounter

## 2023-11-14 DIAGNOSIS — Z85828 Personal history of other malignant neoplasm of skin: Secondary | ICD-10-CM | POA: Diagnosis not present

## 2023-11-14 DIAGNOSIS — D692 Other nonthrombocytopenic purpura: Secondary | ICD-10-CM | POA: Diagnosis not present

## 2023-11-15 DIAGNOSIS — H40011 Open angle with borderline findings, low risk, right eye: Secondary | ICD-10-CM | POA: Diagnosis not present

## 2023-11-24 ENCOUNTER — Other Ambulatory Visit: Payer: Self-pay

## 2023-11-24 ENCOUNTER — Encounter: Payer: Self-pay | Admitting: Internal Medicine

## 2023-11-24 ENCOUNTER — Ambulatory Visit (INDEPENDENT_AMBULATORY_CARE_PROVIDER_SITE_OTHER): Admitting: Internal Medicine

## 2023-11-24 VITALS — BP 143/68 | HR 63 | Temp 97.7°F | Ht 69.0 in | Wt 176.0 lb

## 2023-11-24 DIAGNOSIS — A319 Mycobacterial infection, unspecified: Secondary | ICD-10-CM | POA: Diagnosis not present

## 2023-11-24 DIAGNOSIS — J479 Bronchiectasis, uncomplicated: Secondary | ICD-10-CM

## 2023-11-24 NOTE — Progress Notes (Signed)
 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 male ectopic heart beat insignificant LHC, hx prostate cancer s/p brachytherapy 2012, abnormal chest imaging with pet avid pulm nodules, copd/bronchiectasis referred here by pulmonary for pulm mac  I reviewed chart and discussed with patient Last seen by pulm 09/20/23 dr Louie Rover  F/u prostate cancer showed abnormal imaging --> pet ct shows pet avid pulm nodules. Episodes pna and 01/2023 with sputum cx MAC  He has repeat 09/16/23 afb cultures that continue to show MAC. I asked him how he obtained this after he drank water.  Baseline activitity/exercise tolerance -- He does a lot of yard work and house chores He thinks it takes a little more to get the chores done but nothing he thinks is not due age. 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; nonsmoker (passive smoker as a child)  Vaccinations - utd with tdap 2023, prevnar 13 @ 2016, shingrix 2020   No fever, chill, nightsweat, weight loss Appetite is good Most of my life, I have problems with post-nasal drip and he would get intermittent congestion and clearing throat/cough, but most of the time no cough    Review of  Systems: ROS All other ros negative       Past Medical History:  Diagnosis Date   Basal cell carcinoma (BCC)    BPH (benign prostatic hyperplasia)    Cellulitis    Eczema    History of arthroscopic knee surgery    Hyperlipidemia    Kidney stone    Plantar fasciitis    Prostate cancer (HCC) 2012   Right shoulder pain    Sinusitis    Spinal stenosis    Squamous cell carcinoma in situ     Social History   Tobacco Use   Smoking status: Never   Smokeless tobacco: Never  Vaping Use   Vaping status: Never Used  Substance Use Topics   Alcohol use: No   Drug use: No    Family History  Problem Relation Age of Onset   Other Mother        Organ Failure, no specified per [facility] new patient packet    Heart disease Father        died from. Cerebral Hemorrage, smoker and ETOH    Osteoporosis Sister    Diverticulitis Sister    Thyroid  disease Sister    Breast cancer Sister    Diverticulitis Sister    Melanoma Brother    Depression Son    Alcoholism Son    Pneumonia Nephew    Asthma Nephew    Colon cancer Neg Hx    Esophageal cancer Neg Hx    Stomach cancer Neg Hx    Pancreatic cancer Neg Hx  No Known Allergies  OBJECTIVE: Vitals:   11/24/23 1410  BP: (!) 143/68  Pulse: 63  Temp: 97.7 F (36.5 C)  TempSrc: Temporal  SpO2: 95%  Weight: 176 lb (79.8 kg)  Height: 5' 9 (1.753 m)   Body mass index is 25.99 kg/m.   Physical Exam General/constitutional: no distress, pleasant HEENT: Normocephalic, PER, Conj Clear, EOMI, Oropharynx clear Neck supple CV: rrr no mrg Lungs: clear to auscultation, normal respiratory effort Abd: Soft, Nontender Ext: no edema Skin: No Rash Neuro: nonfocal MSK: no peripheral joint swelling/tenderness/warmth; back spines nontender    Lab: Lab Results  Component Value Date   WBC 4.7 06/06/2023   HGB 14.1 06/06/2023   HCT 43.1 06/06/2023   MCV 89 06/06/2023   PLT 205 06/06/2023   Last metabolic panel Lab Results   Component Value Date   GLUCOSE 104 (H) 06/06/2023   NA 142 06/06/2023   K 4.5 06/06/2023   CL 103 06/06/2023   CO2 24 06/06/2023   BUN 18 06/06/2023   CREATININE 0.84 06/06/2023   EGFR 89 06/06/2023   CALCIUM  9.3 06/06/2023   PROT 6.5 02/24/2023   ALBUMIN 3.7 05/30/2019   BILITOT 0.4 02/24/2023   ALKPHOS 69 05/30/2019   AST 16 02/24/2023   ALT 20 02/24/2023   ANIONGAP 11 05/30/2019    Microbiology:  Serology:  Imaging: Reviewed   02/2023 chest ct 1. Mild bronchiectasis and numerous solid pulmonary nodules, most pronounced in the right lower lobe. Several nodules have decreased in size when compared with prior, others are stable. 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 2. Severe left main and three-vessel coronary artery calcifications. 3. Aortic Atherosclerosis  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 complication (HCC)    -  Primary   Relevant Orders   CT CHEST WO CONTRAST     Atypical mycobacterium infection       Relevant Orders   CT CHEST WO CONTRAST   AFB culture with smear (NOT at [facility])   AFB culture with smear   AFB culture with smear         Discussed natural hx of pulm ntm; infection vs colonization; criteria of diagnosis and treatment  He has been very stable in activity tolerance, mild bronchiectasis and nodules on chest ct   He has had 2 sputum cx that grew mac, however he did consume water before sputum production done   Discuss also poor quality of treatment (side effect, bacteriologic success and possibly not quality of life success being low, and high recurrence)   At this time I would like to monitor with serial ct, clinical symptoms, and repeat sputum   -3 samples of afb sputum --discuss first thing in morning and avoid any intake/brushinging before giving sputum -chest ct in 1 month -follow up at least  weeks after sputum submitted -chart sent to pulm       Follow-up: Return in about 3 months (around 02/24/2024).  Jamesetta Mcbride, MD [facility] 11/24/2023, 2:22 PM

## 2023-11-24 NOTE — Patient Instructions (Signed)
 We'll need very rigorous criteria for diagnosis (sputum collected right way), you have progression of symptoms (short of breath, fever, chill, weight loss, night sweat), and worsening ct scan before considering treatment    A chest ct is ordered to be done within the next few weeks  Repeat sputum as follow: Give sputum first thing in the morning BEFORE BRUSHING, EATING/DRINKING Provide 2-3 sputum samples several days apart   Follow up with me after 6 weeks of submitting sputum (it takes a long time to grow)

## 2023-12-09 ENCOUNTER — Ambulatory Visit (HOSPITAL_COMMUNITY)

## 2023-12-09 ENCOUNTER — Other Ambulatory Visit (HOSPITAL_COMMUNITY)

## 2023-12-12 ENCOUNTER — Other Ambulatory Visit: Payer: Self-pay | Admitting: Urology

## 2023-12-12 DIAGNOSIS — D49512 Neoplasm of unspecified behavior of left kidney: Secondary | ICD-10-CM

## 2023-12-13 ENCOUNTER — Ambulatory Visit (HOSPITAL_COMMUNITY)
Admission: RE | Admit: 2023-12-13 | Discharge: 2023-12-13 | Disposition: A | Discharge status: Home / Self Care | Source: Ambulatory Visit | Attending: Internal Medicine | Admitting: Internal Medicine

## 2023-12-13 DIAGNOSIS — J479 Bronchiectasis, uncomplicated: Secondary | ICD-10-CM | POA: Diagnosis not present

## 2023-12-13 DIAGNOSIS — A319 Mycobacterial infection, unspecified: Secondary | ICD-10-CM | POA: Diagnosis present

## 2023-12-13 DIAGNOSIS — I700 Atherosclerosis of aorta: Secondary | ICD-10-CM | POA: Diagnosis not present

## 2023-12-13 DIAGNOSIS — R918 Other nonspecific abnormal finding of lung field: Secondary | ICD-10-CM | POA: Diagnosis not present

## 2023-12-14 ENCOUNTER — Other Ambulatory Visit (HOSPITAL_COMMUNITY): Payer: Self-pay | Admitting: Urology

## 2023-12-14 DIAGNOSIS — D49512 Neoplasm of unspecified behavior of left kidney: Secondary | ICD-10-CM

## 2023-12-19 ENCOUNTER — Ambulatory Visit: Payer: Self-pay | Admitting: Internal Medicine

## 2023-12-21 DIAGNOSIS — H40011 Open angle with borderline findings, low risk, right eye: Secondary | ICD-10-CM | POA: Diagnosis not present

## 2024-01-11 DIAGNOSIS — Z85828 Personal history of other malignant neoplasm of skin: Secondary | ICD-10-CM | POA: Diagnosis not present

## 2024-01-11 DIAGNOSIS — D1801 Hemangioma of skin and subcutaneous tissue: Secondary | ICD-10-CM | POA: Diagnosis not present

## 2024-01-11 DIAGNOSIS — D2371 Other benign neoplasm of skin of right lower limb, including hip: Secondary | ICD-10-CM | POA: Diagnosis not present

## 2024-01-11 DIAGNOSIS — D224 Melanocytic nevi of scalp and neck: Secondary | ICD-10-CM | POA: Diagnosis not present

## 2024-01-11 DIAGNOSIS — L308 Other specified dermatitis: Secondary | ICD-10-CM | POA: Diagnosis not present

## 2024-01-11 DIAGNOSIS — L812 Freckles: Secondary | ICD-10-CM | POA: Diagnosis not present

## 2024-01-11 DIAGNOSIS — D225 Melanocytic nevi of trunk: Secondary | ICD-10-CM | POA: Diagnosis not present

## 2024-01-11 DIAGNOSIS — L821 Other seborrheic keratosis: Secondary | ICD-10-CM | POA: Diagnosis not present

## 2024-01-24 ENCOUNTER — Other Ambulatory Visit: Payer: Self-pay

## 2024-01-24 ENCOUNTER — Other Ambulatory Visit

## 2024-01-24 DIAGNOSIS — J479 Bronchiectasis, uncomplicated: Secondary | ICD-10-CM

## 2024-02-08 LAB — MYCOBACTERIA,CULT W/FLUOROCHROME SMEAR

## 2024-02-13 ENCOUNTER — Encounter (HOSPITAL_COMMUNITY): Payer: Self-pay

## 2024-02-15 ENCOUNTER — Ambulatory Visit (HOSPITAL_COMMUNITY): Admission: RE | Admit: 2024-02-15 | Source: Ambulatory Visit

## 2024-02-22 ENCOUNTER — Ambulatory Visit (HOSPITAL_COMMUNITY)
Admission: RE | Admit: 2024-02-22 | Discharge: 2024-02-22 | Disposition: A | Discharge status: Home / Self Care | Source: Ambulatory Visit | Attending: Urology | Admitting: Urology

## 2024-02-22 DIAGNOSIS — N281 Cyst of kidney, acquired: Secondary | ICD-10-CM | POA: Diagnosis not present

## 2024-02-22 DIAGNOSIS — D49512 Neoplasm of unspecified behavior of left kidney: Secondary | ICD-10-CM | POA: Diagnosis not present

## 2024-02-22 MED ORDER — GADOBUTROL 1 MMOL/ML IV SOLN
8.0000 mL | Freq: Once | INTRAVENOUS | Status: AC | PRN
  Administered 2024-02-22: 8 mL via INTRAVENOUS

## 2024-02-23 ENCOUNTER — Ambulatory Visit: Admitting: Internal Medicine

## 2024-03-07 DIAGNOSIS — Z8546 Personal history of malignant neoplasm of prostate: Secondary | ICD-10-CM | POA: Diagnosis not present

## 2024-03-14 LAB — M. AVIUM MIC PANEL
AMIKACIN (LIPOSOMAL, INHALED): 16 ug/mL
AMIKACIN: 16 ug/mL
CIPROFLOXACIN: >8 ug/mL
CLARITHROMYCIN: 4 ug/mL
CLOFAZIMINE: 0.25 ug/mL
DOXYCYCLINE: >8 ug/mL
LINEZOLID: 32 ug/mL
MINOCYCLINE: >8 ug/mL
MOXIFLOXACIN: 4 ug/mL
RIFABUTIN: 1 ug/mL
RIFAMPIN: >4 ug/mL
STREPTOMYCIN: 32 ug/mL

## 2024-03-14 LAB — MYCOBACTERIA,CULT W/FLUOROCHROME SMEAR
MICRO NUMBER:: 16824827
SPECIMEN QUALITY:: ADEQUATE

## 2024-03-15 ENCOUNTER — Ambulatory Visit (INDEPENDENT_AMBULATORY_CARE_PROVIDER_SITE_OTHER): Payer: Medicare Other | Admitting: Orthopedic Surgery

## 2024-03-15 ENCOUNTER — Encounter: Payer: Self-pay | Admitting: Orthopedic Surgery

## 2024-03-15 ENCOUNTER — Other Ambulatory Visit: Payer: Self-pay

## 2024-03-15 ENCOUNTER — Other Ambulatory Visit

## 2024-03-15 VITALS — BP 124/62 | HR 65 | Temp 98.2°F | Ht 69.0 in | Wt 179.8 lb

## 2024-03-15 DIAGNOSIS — A319 Mycobacterial infection, unspecified: Secondary | ICD-10-CM

## 2024-03-15 DIAGNOSIS — Z23 Encounter for immunization: Secondary | ICD-10-CM

## 2024-03-15 DIAGNOSIS — Z0000 Encounter for general adult medical examination without abnormal findings: Secondary | ICD-10-CM

## 2024-03-15 MED ORDER — COVID-19 MRNA VACC (MODERNA) 50 MCG/0.5ML IM SUSY: 0.5000 mL | PREFILLED_SYRINGE | INTRAMUSCULAR | 1 refills | Status: AC

## 2024-03-15 NOTE — Patient Instructions (Signed)
  Joseph Marsh , Thank you for taking time to come for your Medicare Wellness Visit. I appreciate your ongoing commitment to your health goals. Please review the following plan we discussed and let me know if I can assist you in the future.   These are the goals we discussed:  Goals      Exercise 3x per week (30 min per time)     More exercise year round        This is a list of the screening recommended for you and due dates:  Health Maintenance  Topic Date Due   COVID-19 Vaccine (6 - 2025-26 season) 02/13/2024   Medicare Annual Wellness Visit  03/15/2025   DTaP/Tdap/Td vaccine (3 - Td or Tdap) 03/09/2032   Pneumococcal Vaccine for age over 54  Completed   Flu Shot  Completed   Zoster (Shingles) Vaccine  Completed   HPV Vaccine  Aged Out   Meningitis B Vaccine  Aged Out   Colon Cancer Screening  Discontinued   Hepatitis C Screening  Discontinued

## 2024-03-15 NOTE — Progress Notes (Signed)
 Subjective:   Joseph Marsh is a 78 y.o. male who presents for Medicare Annual/Subsequent preventive examination.  Visit Complete: In person  Patient Medicare AWV questionnaire was completed by the patient on 03/15/2024; I have confirmed that all information answered by patient is correct and no changes since this date.  Cardiac Risk Factors include: advanced age (>44men, >65 women);dyslipidemia;male gender     Objective:    Today's Vitals   03/15/24 1341  BP: 124/62  Pulse: 65  Temp: 98.2 F (36.8 C)  SpO2: 95%  Weight: 179 lb 12.8 oz (81.6 kg)  Height: 5' 9 (1.753 m)   Body mass index is 26.55 kg/m.     11/01/2023    1:44 AM 08/30/2023    1:47 PM 06/17/2023    9:21 AM 05/03/2023    3:04 PM 03/08/2023    3:53 PM 03/01/2023   10:55 AM 02/11/2023    2:07 PM  Advanced Directives  Does Patient Have a Medical Advance Directive? No No No No No No No  Would patient like information on creating a medical advance directive? No - Patient declined No - Patient declined No - Patient declined No - Patient declined  No - Patient declined No - Patient declined    Current Medications (verified) Outpatient Encounter Medications as of 03/15/2024  Medication Sig   acetaminophen  (TYLENOL ) 500 MG tablet Take 1,000 mg by mouth as needed.   Ascorbic Acid (VITAMIN C) 1000 MG tablet Take 1,000 mg by mouth daily.    aspirin  EC 81 MG tablet Take 1 tablet (81 mg total) by mouth daily. Swallow whole.   atorvastatin  (LIPITOR) 20 MG tablet Take 1 tablet (20 mg total) by mouth daily.   Biotin 1000 MCG tablet Take 1,000 mcg by mouth daily.   Cholecalciferol (VITAMIN D-3 PO) Take 2,000 Units by mouth daily. 2   Coenzyme Q10 (CO Q 10) 100 MG CAPS Take 300 mg by mouth daily.   COVID-19 mRNA vaccine (SPIKEVAX) syringe Inject 0.5 mLs into the muscle as directed.   cyanocobalamin  (VITAMIN B12) 1000 MCG tablet Take 1,000 mcg by mouth daily.   finasteride (PROSCAR) 5 MG tablet Take 5 mg by mouth daily.    fluocinonide-emollient (LIDEX-E) 0.05 % cream Apply 1 application  topically daily as needed (Eczema).   Melatonin 10 MG TABS Take 10 mg by mouth at bedtime.   metoprolol  succinate (TOPROL  XL) 25 MG 24 hr tablet Take 1 tablet (25 mg total) by mouth daily.   Misc Natural Products (GLUCOSAMINE CHOND MSM FORMULA PO) Take 1 tablet by mouth daily. 1500 mg /1500 mg   Multiple Vitamin (MULTIVITAMIN) tablet Take 1 tablet by mouth daily.   Omega-3 Fatty Acids (FISH OIL PO) Take 1,200 mg by mouth daily. Omega 3 700 mg   OVER THE COUNTER MEDICATION Take 1 tablet by mouth daily. osteo bi flex 1500 mg /1100 mg   Turmeric Curcumin 500 MG CAPS Take 500 mg by mouth daily. With ginger power   fluticasone  (FLONASE ) 50 MCG/ACT nasal spray Place 2 sprays into both nostrils daily. (Patient not taking: Reported on 03/15/2024)   loratadine  (CLARITIN ) 10 MG tablet Take 1 tablet (10 mg total) by mouth daily. (Patient not taking: Reported on 03/15/2024)   No facility-administered encounter medications on file as of 03/15/2024.    Allergies (verified) Patient has no known allergies.   History: Past Medical History:  Diagnosis Date   Basal cell carcinoma (BCC)    BPH (benign prostatic hyperplasia)  Cellulitis    Eczema    History of arthroscopic knee surgery    Hyperlipidemia    Kidney stone    Plantar fasciitis    Prostate cancer (HCC) 2012   Right shoulder pain    Sinusitis    Spinal stenosis    Squamous cell carcinoma in situ    Past Surgical History:  Procedure Laterality Date   CHOLECYSTECTOMY N/A 05/24/2013   Procedure: LAPAROSCOPIC CHOLECYSTECTOMY;  Surgeon: Morene ONEIDA Olives, MD;  Location: [facility];  Service: General;  Laterality: N/A;   COLONOSCOPY  1996   polp removal   COLONOSCOPY  08/2020   polpys removed   KNEE SURGERY     LEFT HEART CATH AND CORONARY ANGIOGRAPHY N/A 06/17/2023   Procedure: LEFT HEART CATH AND CORONARY ANGIOGRAPHY;  Surgeon: Anner Alm ORN, MD;  Location: [facility]  INVASIVE CV LAB;  Service: Cardiovascular;  Laterality: N/A;   LITHOTRIPSY     MRI     sinus cauterization  1970   TONSILLECTOMY     Family History  Problem Relation Age of Onset   Other Mother        Organ Failure, no specified per [facility] new patient packet    Heart disease Father        died from. Cerebral Hemorrage, smoker and ETOH    Osteoporosis Sister    Diverticulitis Sister    Thyroid  disease Sister    Breast cancer Sister    Diverticulitis Sister    Melanoma Brother    Depression Son    Alcoholism Son    Pneumonia Nephew    Asthma Nephew    Colon cancer Neg Hx    Esophageal cancer Neg Hx    Stomach cancer Neg Hx    Pancreatic cancer Neg Hx    Social History   Socioeconomic History   Marital status: Married    Spouse name: Not on file   Number of children: 2   Years of education: Not on file   Highest education level: Master's degree (e.g., MA, MS, MEng, MEd, MSW, MBA)  Occupational History    Employer: [employer]  Tobacco Use   Smoking status: Never   Smokeless tobacco: Never  Vaping Use   Vaping status: Never Used  Substance and Sexual Activity   Alcohol use: No   Drug use: No   Sexual activity: Yes  Other Topics Concern   Not on file  Social History Narrative   As of 06/19/2018 [facility] New Patient Packet:      Diet: Fruits, nuts, protein, carbohydrates, sodium, fats, veggies, and some seafood       Caffeine: Average 5 12oz sodas per week       Married, if yes what year: Divorced x 2, married 1970-1999 (1st time) and 2004-2016 (second time)       Do you live in a house, apartment, assisted living, condo, trailer, ect: 3 bed room house, 1 stories, one person       Pets: No      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 No   Do you have difficulty preparing food or eating? No   Do you have difficulty managing your medications?  No   Do you have difficulty managing your finances? No   Do you have difficulty affording your medications? No   Social Drivers of Corporate investment banker Strain: Low Risk  (03/15/2024)   Overall Financial Resource Strain (CARDIA)    Difficulty of Paying Living Expenses: Not hard at all  Food Insecurity: No Food Insecurity (03/15/2024)   Hunger Vital Sign    Worried About Running Out of Food in the Last Year: Never true    Ran Out of Food in the Last Year: Never true  Transportation Needs: No Transportation Needs (03/15/2024)   PRAPARE - Administrator, Civil Service (Medical): No    Lack of Transportation (Non-Medical): No  Physical Activity: Sufficiently Active (03/15/2024)   Exercise Vital Sign    Days of Exercise per Week: 2 days    Minutes of Exercise per Session: 150+ min  Stress: Stress Concern Present (03/15/2024)   [employer] of Occupational Health - Occupational Stress Questionnaire    Feeling of Stress: To some extent  Social Connections: Moderately Integrated (03/15/2024)   Social Connection and Isolation Panel    Frequency of Communication with Friends and Family: More than three times a week    Frequency of Social Gatherings with Friends and Family: Once a week    Attends Religious Services: 1 to 4 times per year    Active Member of [employer] or Organizations: No    Attends Engineer, structural: Never    Marital Status: Married    Tobacco Counseling Counseling given: Not Answered   Clinical Intake:  Pre-visit preparation completed: Yes  Pain : No/denies pain     BMI - recorded: 26.55 Nutritional Status: BMI 25 -29 Overweight Nutritional Risks: None Diabetes: No  How often do you need to have someone help you when you read instructions, pamphlets, or other written materials from your doctor or pharmacy?: 1 -  Never What is the last grade level you completed in school?: Masters degree  Interpreter Needed?: No      Activities of Daily Living    03/15/2024    2:44 PM  In your present state of health, do you have any difficulty performing the following activities:  Vision? 0  Difficulty concentrating or making decisions? 0  Walking or climbing stairs? 0  Dressing or bathing? 0  Doing errands, shopping? 0  Preparing Food and eating ? N  Using the Toilet? N  In the past six months, have you accidently leaked urine? Y  Do you have problems with loss of bowel control? N  Managing your Medications? N  Managing your Finances? N  Housekeeping or managing your Housekeeping? N    Patient Care Team: Sherlynn Madden, MD as PCP - General (Internal Medicine) Kate Lonni CROME, MD as PCP - Cardiology (Cardiology) Melodi Lerner, MD as Consulting Physician (Orthopedic Surgery) Joshua Blamer, MD as Consulting Physician (Dermatology) Renda Glance, MD as Consulting Physician (Urology) Meade Verdon RAMAN, MD as Consulting Physician (Pulmonary Disease) Kate Lonni CROME, MD as Consulting Physician (Cardiology)  Indicate any recent Medical Services you may have received from other than [facility] providers in the past year (date may be approximate).     Assessment:   This is a routine wellness examination for Demario.  Hearing/Vision screen Hearing Screening - Comments:: No hearing issues   Goals Addressed             This Visit's Progress    Exercise 3x per week (30 min per time)  On track    More exercise year round       Depression Screen    03/15/2024    2:49 PM 03/15/2024    2:17 PM 08/30/2023    2:05 PM 08/30/2023    1:50 PM 04/05/2023   11:00 AM 03/08/2023    3:55 PM 02/02/2023    3:29 PM  PHQ 2/9 Scores  PHQ - 2 Score 0 0 0 0 0 0 0  PHQ- 9 Score    4   0    Fall Risk    03/15/2024    2:51 PM 03/15/2024    2:15 PM 08/30/2023    2:05 PM 08/30/2023    1:47 PM  05/03/2023    3:04 PM  Fall Risk   Falls in the past year? 0 0 0 0 0  Number falls in past yr: 0 0 0 0 0  Injury with Fall? 0 0 1 0 0  Risk for fall due to : No Fall Risks No Fall Risks  No Fall Risks No Fall Risks  Follow up Falls evaluation completed Falls evaluation completed  Falls evaluation completed Falls evaluation completed;Education provided;Falls prevention discussed    MEDICARE RISK AT HOME: Medicare Risk at Home Any stairs in or around the home?: Yes If so, are there any without handrails?: Yes Home free of loose throw rugs in walkways, pet beds, electrical cords, etc?: Yes Adequate lighting in your home to reduce risk of falls?: Yes Life alert?: No Use of a cane, walker or w/c?: No Grab bars in the bathroom?: No Shower chair or bench in shower?: No Elevated toilet seat or a handicapped toilet?: Yes  TIMED UP AND GO:  Was the test performed?  Yes  Length of time to ambulate 10 feet: < 5 sec Gait slow and steady without use of assistive device    Cognitive Function:    03/08/2023    3:58 PM 10/30/2018    8:16 AM  MMSE - Mini Mental State Exam  Orientation to time 5 5  Orientation to Place 5 5  Registration 3 3  Attention/ Calculation 5 5  Recall 2 0  Language- name 2 objects 2 2  Language- repeat 1 1  Language- follow 3 step command 3 3  Language- read & follow direction 1 1  Write a sentence 1 1  Copy design 1 1  Total score 29 27        03/15/2024    2:17 PM 03/04/2022   11:09 AM 02/26/2021    2:38 PM 02/25/2020    2:57 PM  6CIT Screen  What Year? 0 points 0 points 0 points 0 points  What month? 0 points 0 points 0 points 0 points  What time? 0 points 0 points 0 points 0 points  Count back from 20 0 points 0 points 0 points 0 points  Months in reverse 0 points 0 points 0 points 0 points  Repeat phrase 0 points 0 points 0 points 0 points  Total Score 0 points 0 points 0 points 0 points    Immunizations Immunization History  Administered Date(s)  Administered   Fluad Quad(high Dose 65+) 03/12/2020   Fluad Trivalent(High Dose 65+) 02/23/2023   INFLUENZA, HIGH DOSE SEASONAL PF 02/15/2019, 04/15/2022, 03/15/2024   Influenza-Unspecified 04/16/2013, 06/24/2014, 06/19/2015, 06/28/2016, 04/14/2017, 03/14/2018, 05/19/2018   Moderna Covid-19 Vaccine  Bivalent Booster 41yrs & up 04/15/2022   PFIZER(Purple Top)SARS-COV-2 Vaccination 07/20/2019, 08/15/2019, 04/11/2020, 11/21/2020   Pneumococcal Conjugate-13 07/23/2014   Pneumococcal Polysaccharide-23  12/18/2009   Pneumococcal-Unspecified 06/14/2016   Tdap 03/01/2011, 03/09/2022   Zoster Recombinant(Shingrix) 06/30/2018, 09/05/2018   Zoster, Live 03/01/2011    TDAP status: Up to date  Flu Vaccine status: Up to date  Pneumococcal vaccine status: Up to date  Covid-19 vaccine status: Completed vaccines  Qualifies for Shingles Vaccine? Yes   Zostavax completed Yes   Shingrix Completed?: Yes  Screening Tests Health Maintenance  Topic Date Due   COVID-19 Vaccine (6 - 2025-26 season) 02/13/2024   Medicare Annual Wellness (AWV)  03/15/2025   DTaP/Tdap/Td (3 - Td or Tdap) 03/09/2032   Pneumococcal Vaccine: 50+ Years  Completed   Influenza Vaccine  Completed   Zoster Vaccines- Shingrix  Completed   HPV VACCINES  Aged Out   Meningococcal B Vaccine  Aged Out   Colonoscopy  Discontinued   Hepatitis C Screening  Discontinued    Health Maintenance  Health Maintenance Due  Topic Date Due   COVID-19 Vaccine (6 - 2025-26 season) 02/13/2024    Colorectal cancer screening: No longer required.   Lung Cancer Screening: (Low Dose CT Chest recommended if Age 28-80 years, 20 pack-year currently smoking OR have quit w/in 15years.) does not qualify.   Lung Cancer Screening Referral: No  Additional Screening:  Hepatitis C Screening: does not qualify; Completed   Vision Screening: Recommended annual ophthalmology exams for early detection of glaucoma and other disorders of the eye. Is the  patient up to date with their annual eye exam?  Yes  Who is the provider or what is the name of the office in which the patient attends annual eye exams? [facility]> Dr. Fleeta If pt is not established with a provider, would they like to be referred to a provider to establish care? No .   Dental Screening: Recommended annual dental exams for proper oral hygiene  Diabetic Foot Exam: Diabetic Foot Exam: Completed 03/15/2024  Community Resource Referral / Chronic Care Management: CRR required this visit?  No   CCM required this visit?  No     Plan:     I have personally reviewed and noted the following in the patient's chart:   Medical and social history Use of alcohol, tobacco or illicit drugs  Current medications and supplements including opioid prescriptions. Patient is not currently taking opioid prescriptions. Functional ability and status Nutritional status Physical activity Advanced directives List of other physicians Hospitalizations, surgeries, and ER visits in previous 12 months Vitals Screenings to include cognitive, depression, and falls Referrals and appointments  In addition, I have reviewed and discussed with patient certain preventive protocols, quality metrics, and best practice recommendations. A written personalized care plan for preventive services as well as general preventive health recommendations were provided to patient.     Greig FORBES Cluster, NP   03/15/2024   After Visit Summary: (MyChart) Due to this being a telephonic visit, the after visit summary with patients personalized plan was offered to patient via MyChart   Nurse Notes: 6CIT score 0. UTD on vaccinations. Diabetic foot exam done today, score 8/10 on monofilament. He plans to get covid booster this fall. Flu vaccination administered today.

## 2024-03-21 DIAGNOSIS — H401121 Primary open-angle glaucoma, left eye, mild stage: Secondary | ICD-10-CM | POA: Diagnosis not present

## 2024-03-21 DIAGNOSIS — H25813 Combined forms of age-related cataract, bilateral: Secondary | ICD-10-CM | POA: Diagnosis not present

## 2024-03-21 DIAGNOSIS — H35373 Puckering of macula, bilateral: Secondary | ICD-10-CM | POA: Diagnosis not present

## 2024-03-22 ENCOUNTER — Ambulatory Visit (INDEPENDENT_AMBULATORY_CARE_PROVIDER_SITE_OTHER): Admitting: Internal Medicine

## 2024-03-22 ENCOUNTER — Other Ambulatory Visit: Payer: Self-pay

## 2024-03-22 VITALS — BP 151/75 | HR 65 | Temp 99.0°F | Wt 178.0 lb

## 2024-03-22 DIAGNOSIS — Z2239 Carrier of other specified bacterial diseases: Secondary | ICD-10-CM | POA: Diagnosis not present

## 2024-03-22 DIAGNOSIS — J479 Bronchiectasis, uncomplicated: Secondary | ICD-10-CM | POA: Diagnosis not present

## 2024-03-22 NOTE — Progress Notes (Signed)
 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 male ectopic heart beat insignificant LHC, hx prostate cancer s/p brachytherapy 2012, abnormal chest imaging with pet avid pulm nodules, copd/bronchiectasis referred here by pulmonary for pulm mac  I reviewed chart and discussed with patient Last seen by pulm 09/20/23 dr Verdon Gore  F/u prostate cancer showed abnormal imaging --> pet ct shows pet avid pulm nodules. Episodes pna and 01/2023 with sputum cx MAC  He has repeat 09/16/23 afb cultures that continue to show MAC. I asked him how he obtained this after he drank water.  Baseline activitity/exercise tolerance -- He does a lot of yard work and house chores He thinks it takes a little more to get the chores done but nothing he thinks is not due age. 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; nonsmoker (passive smoker as a child)  Vaccinations - utd with tdap 2023, prevnar 13 @ 2016, shingrix 2020   No fever, chill, nightsweat, weight loss Appetite is good Most of my life, I have problems with post-nasal drip and he would get intermittent congestion and clearing throat/cough, but most of the time no  cough   -------------------- 03/22/24 id clinic f/u Patient here for f/u pulm mac infection Reviewed ct chest 12/2023 unchanged Recent sputum cultures (after I discussed with him to do first thing in am avoiding brushing/drinking/eating) 01/24/24 sptum afb -- mac ORGANISM MYCOBACTERIUM AVIUM INTRACELLULARE COMPLEX  AMIKACIN 16 S  AMIKACIN (LIPOSOMAL, INHALED) 16 S  CIPROFLOXACIN >8  CLARITHROMYCIN 4 S  CLOFAZIMINE 0.25  DOXYCYCLINE  >8  LINEZOLID 32 R  MINOCYCLINE >8  MOXIFLOXACIN 4 R  RIFAMPIN >4  RIFABUTIN 1  STREPTOMYCIN 32   03/15/24 sputum afb -- smear negative; cx in progress   He mentioned that August 2025 that he eat/drink already when he submitted the sputum  Clinically and from a respiratory standpoint -- occasional cough; functional status stable (doesn't do walking much due to left knee) but he works in the yard a lot up to 6 hours a a day --> mowing, raking leaves, planting, seeding the growd (all manual work)    Review of Systems: ROS All other ros negative       Past Medical History:  Diagnosis Date   Basal cell carcinoma (BCC)    BPH (benign prostatic hyperplasia)    Cellulitis    Eczema    History of arthroscopic knee surgery    Hyperlipidemia    Kidney stone    Plantar fasciitis    Prostate cancer (HCC) 2012   Right shoulder pain    Sinusitis    Spinal stenosis    Squamous cell carcinoma in situ     Social History   Tobacco Use  Smoking status: Never   Smokeless tobacco: Never  Vaping Use   Vaping status: Never Used  Substance Use Topics   Alcohol use: No   Drug use: No    Family History  Problem Relation Age of Onset   Other Mother        Organ Failure, no specified per [facility] new patient packet    Heart disease Father        died from. Cerebral Hemorrage, smoker and ETOH    Osteoporosis Sister    Diverticulitis Sister    Thyroid  disease Sister    Breast cancer Sister    Diverticulitis Sister    Melanoma Brother     Depression Son    Alcoholism Son    Pneumonia Nephew    Asthma Nephew    Colon cancer Neg Hx    Esophageal cancer Neg Hx    Stomach cancer Neg Hx    Pancreatic cancer Neg Hx     No Known Allergies  OBJECTIVE: Vitals:   03/22/24 1601  BP: (!) 151/75  Pulse: 65  Temp: 99 F (37.2 C)  TempSrc: Oral  SpO2: 94%  Weight: 178 lb (80.7 kg)   Body mass index is 26.29 kg/m.   Physical Exam General/constitutional: no distress, pleasant HEENT: Normocephalic, PER, Conj Clear, EOMI, Oropharynx clear Neck supple CV: rrr no mrg Lungs: clear to auscultation, normal respiratory effort Abd: Soft, Nontender Ext: no edema Skin: No Rash Neuro: nonfocal MSK: no peripheral joint swelling/tenderness/warmth; back spines nontender    Lab: Lab Results  Component Value Date   WBC 4.7 06/06/2023   HGB 14.1 06/06/2023   HCT 43.1 06/06/2023   MCV 89 06/06/2023   PLT 205 06/06/2023   Last metabolic panel Lab Results  Component Value Date   GLUCOSE 104 (H) 06/06/2023   NA 142 06/06/2023   K 4.5 06/06/2023   CL 103 06/06/2023   CO2 24 06/06/2023   BUN 18 06/06/2023   CREATININE 0.84 06/06/2023   EGFR 89 06/06/2023   CALCIUM  9.3 06/06/2023   PROT 6.5 02/24/2023   ALBUMIN 3.7 05/30/2019   BILITOT 0.4 02/24/2023   ALKPHOS 69 05/30/2019   AST 16 02/24/2023   ALT 20 02/24/2023   ANIONGAP 11 05/30/2019    Microbiology:  Serology:  Imaging: Reviewed   02/2023 chest ct 1. Mild bronchiectasis and numerous solid pulmonary nodules, most pronounced in the right lower lobe. Several nodules have decreased in size when compared with prior, others are stable. 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 2. Severe left main and three-vessel coronary artery calcifications. 3. Aortic Atherosclerosis    12/13/23 ct chest 1. Numerous pulmonary nodules with largest index nodule in the right lung base measuring 1 x 1.3 cm,  unchanged. 2. Bronchiectasis with bronchial wall thickening is also unchanged. 3. Aortic atherosclerosis. 4. Incidental note of bilateral nonobstructing intrarenal stones.  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 complication (HCC)    -  Primary   Relevant Orders   CT CHEST WO CONTRAST     Mycobacterium avium complex colonization       Relevant Orders   CT CHEST WO CONTRAST          Discussed natural hx of pulm ntm; infection vs colonization; criteria of diagnosis and treatment  He has been very stable in activity tolerance, mild bronchiectasis and nodules on chest ct   He has  had 2 sputum cx that grew mac, however he did consume water before sputum production done   Discuss also poor quality of treatment (side effect, bacteriologic success and possibly not quality of life success being low, and high recurrence)   At this time I would like to monitor with serial ct, clinical symptoms, and repeat sputum   -3 samples of afb sputum --discuss first thing in morning and avoid any intake/brushinging before giving sputum -chest ct in 1 month -follow up at least weeks after sputum submitted -chart sent to pulm    -------------- 03/22/24 id assessment Stable ct, symptoms (assymptomatic pretty much) 01/2024 sputum obtainted after brushing/drinking-eating; 03/2024 sputum first thing in morning  If 03/2024 sputum doesn't grow would get another fresh sample first thing after waking up -- would do this early November to given 03/2024 sample chance to grow  If 03/2024 sample grows, then we know he is colonized with MAC in the lung and then we'll wait to see if worsening ct or symptoms then we'll decide to treat  I'll follow up with him in 9 months and will repeat chest ct scan Will remind patient early 04/2024 if another sputum needed     Follow-up: No follow-ups on file.  Constance ONEIDA Passer, MD [facility] 03/22/2024, 4:10 PM

## 2024-03-22 NOTE — Patient Instructions (Signed)
 You are stable well   Ct stable  Sputum from 10/2 is still cooking Sputum from 01/2024 might be contaminated according to what you described as eating/drinking before submitting    Will get another sputum if 03/2024 one is negative, but if positive then we know at least you are colonized. In that latter case will decide to treat it if you have worsening disease based on ct scan or symptoms    Follow up with me otherwise in 9 months and will plan a chest ct at that time

## 2024-03-26 DIAGNOSIS — N400 Enlarged prostate without lower urinary tract symptoms: Secondary | ICD-10-CM | POA: Diagnosis not present

## 2024-03-26 DIAGNOSIS — C61 Malignant neoplasm of prostate: Secondary | ICD-10-CM | POA: Diagnosis not present

## 2024-03-26 DIAGNOSIS — D49512 Neoplasm of unspecified behavior of left kidney: Secondary | ICD-10-CM | POA: Diagnosis not present

## 2024-03-26 DIAGNOSIS — N401 Enlarged prostate with lower urinary tract symptoms: Secondary | ICD-10-CM | POA: Diagnosis not present

## 2024-03-26 DIAGNOSIS — R3912 Poor urinary stream: Secondary | ICD-10-CM | POA: Diagnosis not present

## 2024-03-30 ENCOUNTER — Other Ambulatory Visit: Payer: Self-pay | Admitting: Urology

## 2024-03-30 DIAGNOSIS — N2889 Other specified disorders of kidney and ureter: Secondary | ICD-10-CM

## 2024-04-25 ENCOUNTER — Other Ambulatory Visit: Payer: Self-pay | Admitting: Cardiology

## 2024-04-27 DIAGNOSIS — Z23 Encounter for immunization: Secondary | ICD-10-CM | POA: Diagnosis not present

## 2024-04-30 LAB — MYCOBACTERIA,CULT W/FLUOROCHROME SMEAR
MICRO NUMBER:: 17053003
SMEAR:: NONE SEEN
SPECIMEN QUALITY:: ADEQUATE

## 2024-05-18 ENCOUNTER — Telehealth: Payer: Self-pay

## 2024-05-18 NOTE — Telephone Encounter (Signed)
 Patient called, reports he has developed a runny nose, coughing, and sneezing. Denies fever. Reports COVID test was negative, but he feels like he has a bad cold.  Reports he collected sputum yesterday. We would be unable to accept this for collection since we cannot collect on Fridays and the sputum must be dropped off within 24 hours of collection.   Encouraged him to also reach out to his pulmonologist.   He would like to know if Dr. Overton has any advice/recommendations for him.   Donn Zanetti, BSN, RN

## 2024-05-18 NOTE — Telephone Encounter (Signed)
 No advice just lots of chicken soup and fluid  Maybe some dumpling  He can submit the sputum if he likes  Thanks

## 2024-05-20 NOTE — Progress Notes (Unsigned)
 OV 05/21/2024  Subjective:  Patient ID: Joseph Marsh, male , DOB: [birth year] , age 78 y.o. , MRN: [id number] , ADDRESS: [address] [place] [address] PCP Sherlynn Madden, MD Patient Care Team: Sherlynn Madden, MD as PCP - General (Internal Medicine) Kate Lonni CROME, MD as PCP - Cardiology (Cardiology) Melodi Lerner, MD as Consulting Physician (Orthopedic Surgery) Joshua Blamer, MD as Consulting Physician (Dermatology) Renda Glance, MD as Consulting Physician (Urology) Meade Verdon RAMAN, MD as Consulting Physician (Pulmonary Disease) Kate Lonni CROME, MD as Consulting Physician (Cardiology)  This Provider for this visit: Treatment Team:  Attending Provider: Geronimo Amel, MD    05/21/2024 -   Chief Complaint  Patient presents with   Acute Visit    Pt states this past week ( beginning of last week) he was having runny nose, congestion Pt stated he has improved since last Friday  05/18/2024 Cough has went away ( Prod cough)       HPI Joseph Marsh 78 y.o. -Joseph Marsh is an 78 year old male with chronic nasal congestion and postnasal drip who presents with a recent episode of runny nose and cough.  He is here for an acute visit   He has a long-standing history of nasal congestion and postnasal drip, which he describes as a 'perpetual nasal congestion and postnasal drainage problem' that has persisted for most of his life. This condition dates back to a tonsillectomy he had at the age of 82. In his late 42s, he experienced some unique symptoms related to this condition and underwent throat cauterization by an ENT specialist, which altered the nature of his symptoms.  Earlier this year, he was referred to an infectious disease center and became a patient of Dr. Overton where he underwent some tests. Posive for MAI.  However, the current visit is prompted by a new onset of symptoms. Erlyr last week 05/18/24  he developed a 'real bad runny nose,' which is unusual for him as he typically experiences nasal congestion. This was followed by the development of a cough. By 05/17/24  he was concerned about these new symptoms, unsure if they were related to his chronic condition or a new cold. On Friday 05/18/24, he attempted to contact Dr. Chapman office but was unable to secure an appointment due to his early closure. Over the weekend, he noted improvement in his symptoms, with no fever developing, and by today, he reports feeling much better. He mentions that the runny nose and cough were particularly bothersome, but he is now significantly improved.  In terms of medication, he has previously used nasal sprays,Flonase  in April 2025, but found them ineffective. He also tried a combination of a nasal spray and Claritin , but discontinued them due to drowsiness and lack of efficacy. He has been spending a lot of time outdoors in recent months, which he notes involves exposure to dust and dirt, potentially exacerbating his symptoms.    CT Chest data from date: 12/13/23  - personally visualized and independently interpreted : no - my findings are: as below Narrative & Impression  CLINICAL DATA:  Ordering nodules and bronchiectasis for follow-up. Concern for atypical mycobacterial infection.   EXAM: CT CHEST WITHOUT CONTRAST   TECHNIQUE: Multidetector CT imaging of the chest was performed following the standard protocol without IV contrast.   RADIATION DOSE REDUCTION: This exam was performed according to the departmental dose-optimization program which includes automated exposure control, adjustment of the mA and/or kV  according to patient size and/or use of iterative reconstruction technique.   COMPARISON:  02/15/2023 and 07/06/2022   FINDINGS: Cardiovascular: Normal heart size. No pericardial effusions. Normal caliber thoracic aorta. Calcification of the aorta and coronary arteries.   Mediastinum/Nodes:  Esophagus is decompressed. No significant lymphadenopathy. Thyroid  gland is unremarkable.   Lungs/Pleura: No pleural effusion or pneumothorax. Numerous pulmonary nodules are demonstrated in the right lung base and scattered pulmonary nodules on the left. Largest index nodule is in the right lung base, series 6, image 123, measuring 1 x 1.3 cm. No significant change since the prior studies. Bronchiectasis and bronchial wall thickening. Right apical pleural calcification. No change since prior study.   Upper Abdomen: Surgical absence of the gallbladder. Bilateral intrarenal stones, largest on the right measuring 6 mm diameter. No hydronephrosis.   Musculoskeletal: Degenerative changes in the spine. No acute bony abnormalities.   IMPRESSION: 1. Numerous pulmonary nodules with largest index nodule in the right lung base measuring 1 x 1.3 cm, unchanged. 2. Bronchiectasis with bronchial wall thickening is also unchanged. 3. Aortic atherosclerosis. 4. Incidental note of bilateral nonobstructing intrarenal stones.     Electronically Signed   By: Elsie Gravely M.D.   On: 12/17/2023 21:41    PFT     Latest Ref Rng & Units 09/15/2023    2:21 PM  PFT Results  FVC-Pre L 3.82   FVC-Predicted Pre % 93   FVC-Post L 3.80   FVC-Predicted Post % 93   Pre FEV1/FVC % % 49   Post FEV1/FCV % % 65   FEV1-Pre L 1.89   FEV1-Predicted Pre % 64   FEV1-Post L 2.47   DLCO uncorrected ml/min/mmHg 24.25   DLCO UNC% % 98   DLCO corrected ml/min/mmHg 24.25   DLCO COR %Predicted % 98   DLVA Predicted % 101   TLC L 7.93   TLC % Predicted % 112   RV % Predicted % 146        LAB RESULTS last 96 hours No results found.       has a past medical history of Basal cell carcinoma (BCC), BPH (benign prostatic hyperplasia), Cellulitis, Coronary artery disease, Eczema, History of arthroscopic knee surgery, Hyperlipidemia, Kidney stone, Plantar fasciitis, Prostate cancer (HCC) (2012), Right  shoulder pain, Sinusitis, Spinal stenosis, and Squamous cell carcinoma in situ.   reports that he has never smoked. He has never used smokeless tobacco.  Past Surgical History:  Procedure Laterality Date   CHOLECYSTECTOMY N/A 05/24/2013   Procedure: LAPAROSCOPIC CHOLECYSTECTOMY;  Surgeon: Morene ONEIDA Olives, MD;  Location: [facility];  Service: General;  Laterality: N/A;   COLONOSCOPY  1996   polp removal   COLONOSCOPY  08/2020   polpys removed   KNEE SURGERY     LEFT HEART CATH AND CORONARY ANGIOGRAPHY N/A 06/17/2023   Procedure: LEFT HEART CATH AND CORONARY ANGIOGRAPHY;  Surgeon: Anner Alm ORN, MD;  Location[facility] INVASIVE CV LAB;  Service: Cardiovascular;  Laterality: N/A;   LITHOTRIPSY     MRI     sinus cauterization  1970   TONSILLECTOMY      No Known Allergies  Immunization History  Administered Date(s) Administered   Fluad Quad(high Dose 65+) 03/12/2020   Fluad Trivalent(High Dose 65+) 02/23/2023   INFLUENZA, HIGH DOSE SEASONAL PF 02/15/2019, 04/15/2022, 03/15/2024   Influenza-Unspecified 04/16/2013, 06/24/2014, 06/19/2015, 06/28/2016, 04/14/2017, 03/14/2018, 05/19/2018   Moderna Covid-19 Vaccine  Bivalent Booster 110yrs & up 04/15/2022   PFIZER(Purple Top)SARS-COV-2 Vaccination 07/20/2019, 08/15/2019, 04/11/2020, 11/21/2020   Pneumococcal  Conjugate-13 07/23/2014   Pneumococcal Polysaccharide-23 12/18/2009   Pneumococcal-Unspecified 06/14/2016   Tdap 03/01/2011, 03/09/2022   Zoster Recombinant(Shingrix) 06/30/2018, 09/05/2018   Zoster, Live 03/01/2011    Family History  Problem Relation Age of Onset   Other Mother        Organ Failure, no specified per [facility] new patient packet    Heart disease Father        died from. Cerebral Hemorrage, smoker and ETOH    Osteoporosis Sister    Diverticulitis Sister    Thyroid  disease Sister    Breast cancer Sister    Diverticulitis Sister    Melanoma Brother    Depression Son    Alcoholism Son    Pneumonia Nephew    Asthma  Nephew    Colon cancer Neg Hx    Esophageal cancer Neg Hx    Stomach cancer Neg Hx    Pancreatic cancer Neg Hx      Current Outpatient Medications:    Ascorbic Acid (VITAMIN C) 1000 MG tablet, Take 1,000 mg by mouth daily. , Disp: , Rfl:    aspirin  EC 81 MG tablet, Take 1 tablet (81 mg total) by mouth daily. Swallow whole., Disp: 90 tablet, Rfl: 3   atorvastatin  (LIPITOR) 20 MG tablet, TAKE 1 TABLET BY MOUTH DAILY, Disp: 90 tablet, Rfl: 0   azelastine  (ASTELIN ) 0.1 % nasal spray, Place 2 sprays into both nostrils 2 (two) times daily. Use in each nostril as directed, Disp: 30 mL, Rfl: 12   Biotin 1000 MCG tablet, Take 1,000 mcg by mouth daily., Disp: , Rfl:    Cholecalciferol (VITAMIN D-3 PO), Take 2,000 Units by mouth daily. 2, Disp: , Rfl:    Coenzyme Q10 (CO Q 10) 100 MG CAPS, Take 300 mg by mouth daily., Disp: , Rfl:    cyanocobalamin  (VITAMIN B12) 1000 MCG tablet, Take 1,000 mcg by mouth daily., Disp: , Rfl:    finasteride (PROSCAR) 5 MG tablet, Take 5 mg by mouth daily., Disp: , Rfl:    fluocinonide-emollient (LIDEX-E) 0.05 % cream, Apply 1 application  topically daily as needed (Eczema)., Disp: , Rfl:    melatonin 5 MG TABS, Take 5 mg by mouth., Disp: , Rfl:    metoprolol  succinate (TOPROL  XL) 25 MG 24 hr tablet, Take 1 tablet (25 mg total) by mouth daily., Disp: 90 tablet, Rfl: 3   Misc Natural Products (GLUCOSAMINE CHOND MSM FORMULA PO), Take 1 tablet by mouth daily. 1500 mg /1500 mg, Disp: , Rfl:    Multiple Vitamin (MULTIVITAMIN) tablet, Take 1 tablet by mouth daily., Disp: , Rfl:    Omega-3 Fatty Acids (FISH OIL PO), Take 1,200 mg by mouth daily. Omega 3 700 mg, Disp: , Rfl:    OVER THE COUNTER MEDICATION, Take 1 tablet by mouth daily. osteo bi flex 1500 mg /1100 mg, Disp: , Rfl:    Turmeric Curcumin 500 MG CAPS, Take 500 mg by mouth daily. With ginger power, Disp: , Rfl:    acetaminophen  (TYLENOL ) 500 MG tablet, Take 1,000 mg by mouth as needed. (Patient not taking: Reported on  05/21/2024), Disp: , Rfl:    COVID-19 mRNA vaccine (SPIKEVAX) syringe, Inject 0.5 mLs into the muscle as directed. (Patient not taking: Reported on 05/21/2024), Disp: 0.5 mL, Rfl: 1   Melatonin 10 MG TABS, Take 10 mg by mouth at bedtime., Disp: , Rfl:       Objective:   Vitals:   05/21/24 1319  BP: 138/67  Pulse: (!) 58  Temp: 97.7 F (36.5 C)  TempSrc: Oral  SpO2: 97%  Weight: 182 lb 6.4 oz (82.7 kg)  Height: 5' 9 (1.753 m)    Estimated body mass index is 26.94 kg/m as calculated from the following:   Height as of this encounter: 5' 9 (1.753 m).   Weight as of this encounter: 182 lb 6.4 oz (82.7 kg).  @WEIGHTCHANGE @  [employer]   05/21/24 1319  Weight: 182 lb 6.4 oz (82.7 kg)     Physical Exam   General: No distress. Looks well O2 at rest: no Cane present: no Sitting in wheel chair: no Frail: no Obese: no Neuro: Alert and Oriented x 3. GCS 15. Speech normal Psych: Pleasant Resp:  Barrel Chest - no.  Wheeze - no, Crackles - no, No overt respiratory distress CVS: Normal heart sounds. Murmurs - no Ext: Stigmata of Connective Tissue Disease - no HEENT: Normal upper airway. PEERL +.  post nasal drip +        Assessment/     Assessment & Plan Rhinorrhea  Chronic rhinitis  Post-nasal drip  Chronic throat clearing    PLAN Patient Instructions     ICD-10-CM   1. Rhinorrhea  J34.89     2. Chronic rhinitis  J31.0     3. Post-nasal drip  R09.82     4. Chronic throat clearing  R09.89       Seems he had a respiratory viral cold which typically is rhinovirus.  Glad you are over it.  I have some recommendations for your chronic nasal congestion  Plan  - Astelin  nasal spray 2 squirts into each nostril twice daily   Follow-up - According to Dr. Meade BOSCH    Return for per DR Meade.    SIGNATURE    Dr. Dorethia Cave, M.D., F.C.C.P,  Pulmonary and Critical Care Medicine Staff Physician, [facility]  Director - Interstitial Lung Disease  Program  Pulmonary Fibrosis [facility] at [facility] [place], [place], [address]  Pager: [phone], If no answer or between  15:00h - 7:00h: call [phone] Telephone: [phone]  1:50 PM 05/21/2024

## 2024-05-21 ENCOUNTER — Ambulatory Visit: Admitting: Internal Medicine

## 2024-05-21 ENCOUNTER — Telehealth: Payer: Self-pay | Admitting: *Deleted

## 2024-05-21 ENCOUNTER — Other Ambulatory Visit: Payer: Self-pay | Admitting: Cardiology

## 2024-05-21 ENCOUNTER — Encounter: Payer: Self-pay | Admitting: Internal Medicine

## 2024-05-21 ENCOUNTER — Telehealth: Payer: Self-pay | Admitting: Cardiology

## 2024-05-21 VITALS — BP 138/67 | HR 58 | Temp 97.7°F | Ht 69.0 in | Wt 182.4 lb

## 2024-05-21 DIAGNOSIS — I351 Nonrheumatic aortic (valve) insufficiency: Secondary | ICD-10-CM

## 2024-05-21 DIAGNOSIS — I2510 Atherosclerotic heart disease of native coronary artery without angina pectoris: Secondary | ICD-10-CM

## 2024-05-21 DIAGNOSIS — E785 Hyperlipidemia, unspecified: Secondary | ICD-10-CM

## 2024-05-21 DIAGNOSIS — R9439 Abnormal result of other cardiovascular function study: Secondary | ICD-10-CM

## 2024-05-21 DIAGNOSIS — R0989 Other specified symptoms and signs involving the circulatory and respiratory systems: Secondary | ICD-10-CM

## 2024-05-21 DIAGNOSIS — J310 Chronic rhinitis: Secondary | ICD-10-CM | POA: Diagnosis not present

## 2024-05-21 DIAGNOSIS — J3489 Other specified disorders of nose and nasal sinuses: Secondary | ICD-10-CM

## 2024-05-21 DIAGNOSIS — R0982 Postnasal drip: Secondary | ICD-10-CM | POA: Diagnosis not present

## 2024-05-21 DIAGNOSIS — I493 Ventricular premature depolarization: Secondary | ICD-10-CM

## 2024-05-21 MED ORDER — METOPROLOL SUCCINATE ER 25 MG PO TB24: 25.0000 mg | ORAL_TABLET | Freq: Every day | ORAL | 0 refills | Status: AC

## 2024-05-21 MED ORDER — AZELASTINE HCL 0.1 % NA SOLN: 2.0000 | Freq: Two times a day (BID) | NASAL | 12 refills | Status: AC

## 2024-05-21 NOTE — Telephone Encounter (Signed)
 Refills has been sent to the pharmacy.

## 2024-05-21 NOTE — Telephone Encounter (Signed)
 Will be discussed at f/u this pm.  Copied from CRM #[id number]. Topic: Clinical - Medical Advice >> May 18, 2024 11:18 AM Lavanda D wrote: Reason for CRM: Patient is scheduled on 12/8 for the following: dealing with some bad cold-like symptoms, runny nose, lots of coughing and sneezing, clear mucus. Feeling generally bad. He has not had any temperature and has taken a Covid test which came back negative. Patient said he has not been having any trouble breathing either, sometimes feels like he needs to take a deeper breath, patient said he would not say it's SOB. Patient would like to speak with a nurse to see if there's any advice that can be offered to help with his symptoms up until the time of his acute visit.

## 2024-05-21 NOTE — Patient Instructions (Signed)
 ICD-10-CM   1. Rhinorrhea  J34.89     2. Chronic rhinitis  J31.0     3. Post-nasal drip  R09.82     4. Chronic throat clearing  R09.89       Seems he had a respiratory viral cold which typically is rhinovirus.  Glad you are over it.  I have some recommendations for your chronic nasal congestion  Plan  - Astelin  nasal spray 2 squirts into each nostril twice daily   Follow-up - According to Dr. Meade

## 2024-05-21 NOTE — Telephone Encounter (Signed)
*  STAT* If patient is at the pharmacy, call can be transferred to refill team.   1. Which medications need to be refilled? (please list name of each medication and dose if known)  metoprolol  succinate (TOPROL  XL) 25 MG 24 hr tablet Medication    2. Would you like to learn more about the convenience, safety, & potential cost savings by using the [facility]? no   3. Are you open to using the [facility] (Type [facility]. No    4. Which pharmacy/location (including street and city if local pharmacy) is medication to be sent to?  [facility] [id number] - [place], [place] - [address]    5. Do they need a 30 day or 90 day supply? 90 days   Scheduled for 1/19 at 10:05 .

## 2024-06-12 ENCOUNTER — Ambulatory Visit: Admitting: Cardiology

## 2024-06-12 NOTE — Progress Notes (Deleted)
 " Cardiology Office Note:    Date:  06/12/2024   ID:  [name], DOB [birth year], MRN [id number]  PCP:  Sherlynn Madden, MD  Cardiologist:  Lonni LITTIE Nanas, MD  Electrophysiologist:  None   Referring MD: Sherlynn Madden, MD   No chief complaint on file.   History of Present Illness:    Joseph Marsh is a 78 y.o. male with a hx of CAD, hyperlipidemia, prostate cancer, spinal stenosis, bronchiectasis who presents for follow-up.  He was referred by Dr. Meade for evaluation of irregular heart rhythm and CAD.  CT chest 02/2023 showed severe coronary calcifications.  Stress PET on 05/18/2023 showed mild reversible basal anterior/anterolateral perfusion defect consistent with ischemia; myocardial blood flow reserve was overall normal but decreased (1.56) in area of perfusion defect, EF 37% and decreased to 31% with stress (question accuracy of EF due to frequent PVCs that can affect gating), severe coronary calcifications; overall intermediate risk study.  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  Zio patch x 4 days 04/2023 showed 1 episode of NSVT lasting 5 beats, 14 episodes of SVT with longest lasting 25 seconds with average rate 136 bpm, frequent PVCs (9.2% of beats).  LHC 06/17/2023 showed minimal CAD.  Since last clinic visit, he reports he is doing well.  Does report some dyspnea. Denies any chest pain,  lightheadedness, syncope, lower extremity edema, or palpitations.   Past Medical History:  Diagnosis Date   Basal cell carcinoma (BCC)    BPH (benign prostatic hyperplasia)    Cellulitis    Coronary artery disease    Eczema    History of arthroscopic knee surgery    Hyperlipidemia    Kidney stone    Plantar fasciitis    Prostate cancer (HCC) 2012   Right shoulder pain    Sinusitis    Spinal stenosis    Squamous cell carcinoma in situ     Past Surgical History:   Procedure Laterality Date   CHOLECYSTECTOMY N/A 05/24/2013   Procedure: LAPAROSCOPIC CHOLECYSTECTOMY;  Surgeon: Morene ONEIDA Olives, MD;  Location: [facility];  Service: General;  Laterality: N/A;   COLONOSCOPY  1996   polp removal   COLONOSCOPY  08/2020   polpys removed   KNEE SURGERY     LEFT HEART CATH AND CORONARY ANGIOGRAPHY N/A 06/17/2023   Procedure: LEFT HEART CATH AND CORONARY ANGIOGRAPHY;  Surgeon: Anner Alm ORN, MD;  Location: [facility] INVASIVE CV [facility];  Service: Cardiovascular;  Laterality: N/A;   LITHOTRIPSY     MRI     sinus cauterization  1970   TONSILLECTOMY      Current Medications: No outpatient medications have been marked as taking for the 06/12/24 encounter (Appointment) with Nanas Lonni LITTIE, MD.     Allergies:   Patient has no known allergies.   Social History   Socioeconomic History   Marital status: Married    Spouse name: Not on file   Number of children: 2   Years of education: Not on file   Highest education level: Master's degree (e.g., MA, MS, MEng, MEd, MSW, MBA)  Occupational History    Employer: [employer]  Tobacco Use   Smoking status: Never   Smokeless tobacco: Never  Vaping Use   Vaping status: Never Used  Substance and Sexual Activity   Alcohol use: No   Drug use: No   Sexual activity: Yes  Other Topics Concern  Not on file  Social History Narrative   As of 06/19/2018 [facility] New Patient Packet:      Diet: Fruits, nuts, protein, carbohydrates, sodium, fats, veggies, and some seafood       Caffeine: Average 5 12oz sodas per week       Married, if yes what year: Divorced x 2, married 1970-1999 (1st time) and 2004-2016 (second time)       Do you live in a house, apartment, assisted living, condo, trailer, ect: 3 bed room house, 1 stories, one person       Pets: No      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 No   Do you have difficulty preparing food or eating? No   Do you have difficulty managing your medications? No   Do you have difficulty managing your finances? No   Do you have difficulty affording your medications? No   Social Drivers of Health   Tobacco Use: Low Risk (05/21/2024)   Patient History    Smoking Tobacco Use: Never    Smokeless Tobacco Use: Never    Passive Exposure: Not on file  Financial Resource Strain: Low Risk (03/15/2024)   Overall Financial Resource Strain (CARDIA)    Difficulty of Paying Living Expenses: Not hard at all  Food Insecurity: No Food Insecurity (03/15/2024)   Epic    Worried About Programme Researcher, Broadcasting/film/video in the Last Year: Never true    Ran Out of Food in the Last Year: Never true  Transportation Needs: No Transportation Needs (03/15/2024)   Epic    Lack of Transportation (Medical): No    Lack of Transportation (Non-Medical): No  Physical Activity: Sufficiently Active (03/15/2024)   Exercise Vital Sign    Days of Exercise per Week: 2 days    Minutes of Exercise per Session: 150+ min  Stress: Stress Concern Present (03/15/2024)   [employer] of Occupational Health - Occupational Stress Questionnaire    Feeling of Stress: To some extent  Social Connections: Moderately Integrated (03/15/2024)   Social Connection and Isolation Panel    Frequency of Communication with Friends and Family: More than three times a week    Frequency of Social Gatherings with Friends and Family: Once a week    Attends Religious Services: 1 to 4 times per year    Active Member of Clubs or Organizations: No    Attends Banker Meetings: Never    Marital Status: Married  Depression (PHQ2-9): Low Risk (03/15/2024)   Depression (PHQ2-9)    PHQ-2 Score: 0  Alcohol Screen: Low Risk (03/15/2024)   Alcohol Screen    Last Alcohol Screening Score  (AUDIT): 0  Housing: Low Risk (03/15/2024)   Epic    Unable to Pay for Housing in the Last Year: No    Number of Times Moved in the Last Year: 0    Homeless in the Last Year: No  Utilities: Not At Risk (03/15/2024)   Epic    Threatened with loss of utilities: No  Health Literacy: Adequate Health Literacy (03/15/2024)   B1300 Health Literacy    Frequency of need for help with medical instructions: Never     Family  History: The patient's family history includes Alcoholism in his son; Asthma in his nephew; Breast cancer in his sister; Depression in his son; Diverticulitis in his sister and sister; Heart disease in his father; Melanoma in his brother; Osteoporosis in his sister; Other in his mother; Pneumonia in his nephew; Thyroid  disease in his sister. There is no history of Colon cancer, Esophageal cancer, Stomach cancer, or Pancreatic cancer.  ROS:   Please see the history of present illness.     All other systems reviewed and are negative.  EKGs/Labs/Other Studies Reviewed:    The following studies were reviewed today:   EKG:   02/23/2023: Sinus rhythm with ventricular bigeminy, rate 73, QTc 430 04/28/23: Sinus bradycardia, rate 54, QTc 400 06/23/2023: Sinus bradycardia, rate 54, right bundle branch block, QTc 421  Recent Labs: No results found for requested labs within last 365 days.  Recent Lipid Panel    Component Value Date/Time   CHOL 119 06/23/2023 1437   TRIG 172 (H) 06/23/2023 1437   HDL 38 (L) 06/23/2023 1437   CHOLHDL 3.1 06/23/2023 1437   CHOLHDL 4.2 02/24/2023 0807   VLDL 62 (H) 09/11/2009 0137   LDLCALC 52 06/23/2023 1437   LDLCALC 99 02/24/2023 0807    Physical Exam:    VS:  There were no vitals taken for this visit.    Wt Readings from Last 3 Encounters:  05/21/24 182 lb 6.4 oz (82.7 kg)  03/22/24 178 lb (80.7 kg)  03/15/24 179 lb 12.8 oz (81.6 kg)     GEN:  Well nourished, well developed in no acute distress HEENT: Normal NECK: No JVD; No carotid  bruits LYMPHATICS: No lymphadenopathy CARDIAC: Irregular RESPIRATORY:  Clear to auscultation without rales, wheezing or rhonchi  ABDOMEN: Soft, non-tender, non-distended MUSCULOSKELETAL:  No edema; No deformity  SKIN: Warm and dry NEUROLOGIC:  Alert and oriented x 3 PSYCHIATRIC:  Normal affect   ASSESSMENT:    No diagnosis found.    PLAN:    CAD: CT chest 02/2023 showed severe coronary calcifications.  He is not reporting anginal symptoms but activity has been limited due to knee pain.  Stress PET on 05/18/2023 showed mild reversible basal anterior/anterolateral perfusion defect consistent with ischemia; myocardial blood flow reserve was overall normal but decreased (1.56) in area of perfusion defect, EF 37% and decreased to 31% with stress (question accuracy of EF due to frequent PVCs that can affect gating), severe coronary calcifications; overall intermediate risk study.  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  LHC 06/17/2023 showed minimal CAD. -Continue aspirin , statin  Frequent PVCs: Noted to be in ventricular bigeminy on recent EKG 02/2023.  Zio patch x 4 days 04/2023 showed 1 episode of NSVT lasting 5 beats, 14 episodes of SVT with longest lasting 25 seconds with average rate 136 bpm, frequent PVCs (9.2% of beats). -Continue Toprol -XL 25 mg daily  Hyperlipidemia: On atorvastatin  20 mg daily, LDL 52 on 06/23/2023.  Aortic regurgitation: Moderate on echocardiogram 05/2023.  Will monitor, recommend repeat echocardiogram***   RTC in 6 months***     Medication Adjustments/Labs and Tests Ordered: Current medicines are reviewed at length with the patient today.  Concerns regarding medicines are outlined above.  No orders of the defined types were placed in this encounter.  No orders of the defined types were placed in this encounter.   There are no Patient Instructions on file for this visit.    Signed,  Lonni LITTIE Nanas, MD  06/12/2024 12:54 PM    [facility] "

## 2024-06-19 NOTE — Progress Notes (Unsigned)
 " Cardiology Office Note:    Date:  06/20/2024   ID:  Joseph Marsh, DOB 11-Aug-1945, MRN [id number]  PCP:  Sherlynn Madden, MD  Cardiologist:  Lonni LITTIE Nanas, MD  Electrophysiologist:  None   Referring MD: Sherlynn Madden, MD   Chief Complaint  Patient presents with   Coronary Artery Disease    History of Present Illness:    Joseph Marsh is a 78 y.o. male with a hx of CAD, hyperlipidemia, prostate cancer, spinal stenosis, bronchiectasis who presents for follow-up.  He was referred by Dr. Meade for evaluation of irregular heart rhythm and CAD.  CT chest 02/2023 showed severe coronary calcifications.  Stress PET on 05/18/2023 showed mild reversible basal anterior/anterolateral perfusion defect consistent with ischemia; myocardial blood flow reserve was overall normal but decreased (1.56) in area of perfusion defect, EF 37% and decreased to 31% with stress (question accuracy of EF due to frequent PVCs that can affect gating), severe coronary calcifications; overall intermediate risk study.  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  Zio patch x 4 days 04/2023 showed 1 episode of NSVT lasting 5 beats, 14 episodes of SVT with longest lasting 25 seconds with average rate 136 bpm, frequent PVCs (9.2% of beats).  LHC 06/17/2023 showed minimal CAD.  Since last clinic visit, he reports he is doing okay.  Did report had some blood on toilet paper yesterday. Denies any chest pain, dyspnea, lightheadedness, syncope, lower extremity edema, or palpitations.   Past Medical History:  Diagnosis Date   Basal cell carcinoma (BCC)    BPH (benign prostatic hyperplasia)    Cellulitis    Coronary artery disease    Eczema    History of arthroscopic knee surgery    Hyperlipidemia    Kidney stone    Plantar fasciitis    Prostate cancer (HCC) 2012   Right shoulder pain    Sinusitis    Spinal  stenosis    Squamous cell carcinoma in situ     Past Surgical History:  Procedure Laterality Date   CHOLECYSTECTOMY N/A 05/24/2013   Procedure: LAPAROSCOPIC CHOLECYSTECTOMY;  Surgeon: Morene ONEIDA Olives, MD;  Location: [facility];  Service: General;  Laterality: N/A;   COLONOSCOPY  1996   polp removal   COLONOSCOPY  08/2020   polpys removed   KNEE SURGERY     LEFT HEART CATH AND CORONARY ANGIOGRAPHY N/A 06/17/2023   Procedure: LEFT HEART CATH AND CORONARY ANGIOGRAPHY;  Surgeon: Anner Alm ORN, MD;  [facility];  Service: Cardiovascular;  Laterality: N/A;   LITHOTRIPSY     MRI     sinus cauterization  1970   TONSILLECTOMY      Current Medications: Current Meds  Medication Sig   acetaminophen  (TYLENOL ) 500 MG tablet Take 1,000 mg by mouth as needed.   Ascorbic Acid (VITAMIN C) 1000 MG tablet Take 1,000 mg by mouth daily.    aspirin  EC 81 MG tablet Take 1 tablet (81 mg total) by mouth daily. Swallow whole.   atorvastatin  (LIPITOR) 20 MG tablet TAKE 1 TABLET BY MOUTH DAILY   azelastine  (ASTELIN ) 0.1 % nasal spray Place 2 sprays into both nostrils 2 (two) times daily. Use in each nostril as directed   Biotin 1000 MCG tablet Take 1,000 mcg by mouth daily.   Cholecalciferol (VITAMIN D-3 PO) Take 2,000 Units by mouth daily. 2   Coenzyme Q10 (CO Q 10) 100 MG  CAPS Take 300 mg by mouth daily.   cyanocobalamin  (VITAMIN B12) 1000 MCG tablet Take 1,000 mcg by mouth daily.   finasteride (PROSCAR) 5 MG tablet Take 5 mg by mouth daily.   fluocinonide-emollient (LIDEX-E) 0.05 % cream Apply 1 application  topically daily as needed (Eczema).   melatonin 5 MG TABS Take 5 mg by mouth.   metoprolol  succinate (TOPROL  XL) 25 MG 24 hr tablet Take 1 tablet (25 mg total) by mouth daily. KEEP OV.   Misc Natural Products (GLUCOSAMINE CHOND MSM FORMULA PO) Take 1 tablet by mouth daily. 1500 mg /1500 mg   Multiple Vitamin (MULTIVITAMIN) tablet Take 1 tablet by mouth daily.   Omega-3 Fatty  Acids (FISH OIL PO) Take 1,200 mg by mouth daily. Omega 3 700 mg   OVER THE COUNTER MEDICATION Take 1 tablet by mouth daily. osteo bi flex 1500 mg /1100 mg   Turmeric Curcumin 500 MG CAPS Take 500 mg by mouth daily. With ginger power     Allergies:   Patient has no known allergies.   Social History   Socioeconomic History   Marital status: Married    Spouse name: Not on file   Number of children: 2   Years of education: Not on file   Highest education level: Master's degree (e.g., MA, MS, MEng, MEd, MSW, MBA)  Occupational History    Employer: [employer]  Tobacco Use   Smoking status: Never   Smokeless tobacco: Never  Vaping Use   Vaping status: Never Used  Substance and Sexual Activity   Alcohol use: No   Drug use: No   Sexual activity: Yes  Other Topics Concern   Not on file  Social History Narrative   As of 06/19/2018 [facility] New Patient Packet:      Diet: Fruits, nuts, protein, carbohydrates, sodium, fats, veggies, and some seafood       Caffeine: Average 5 12oz sodas per week       Married, if yes what year: Divorced x 2, married 1970-1999 (1st time) and 2004-2016 (second time)       Do you live in a house, apartment, assisted living, condo, trailer, ect: 3 bed room house, 1 stories, one person       Pets: No      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 No   Do you have difficulty preparing food or eating? No   Do you have difficulty managing your medications? No   Do you have difficulty managing your finances? No   Do you have difficulty affording your medications? No   Social Drivers of Health   Tobacco Use: Low Risk (06/20/2024)   Patient History    Smoking Tobacco Use: Never    Smokeless Tobacco Use: Never    Passive  Exposure: Not on file  Financial Resource Strain: Low Risk (03/15/2024)   Overall Financial Resource Strain (CARDIA)    Difficulty of Paying Living Expenses: Not hard at all  Food Insecurity: No Food Insecurity (03/15/2024)   Epic    Worried About Radiation Protection Practitioner of Food in the Last Year: Never true    Ran Out of Food in the Last Year: Never true  Transportation Needs: No Transportation Needs (03/15/2024)   Epic    Lack of Transportation (Medical): No    Lack of Transportation (Non-Medical): No  Physical Activity: Sufficiently Active (03/15/2024)   Exercise Vital Sign    Days of Exercise per Week: 2 days    Minutes of Exercise per Session: 150+ min  Stress: Stress Concern Present (03/15/2024)   [employer] of Occupational Health - Occupational Stress Questionnaire    Feeling of Stress: To some extent  Social Connections: Moderately Integrated (03/15/2024)   Social Connection and Isolation Panel    Frequency of Communication with Friends and Family: More than three times a week    Frequency of Social Gatherings with Friends and Family: Once a week    Attends Religious Services: 1 to 4 times per year    Active Member of Clubs or Organizations: No    Attends Banker Meetings: Never    Marital Status: Married  Depression (PHQ2-9): Low Risk (03/15/2024)   Depression (PHQ2-9)    PHQ-2 Score: 0  Alcohol Screen: Low Risk (03/15/2024)   Alcohol Screen    Last Alcohol Screening Score (AUDIT): 0  Housing: Low Risk (03/15/2024)   Epic    Unable to Pay for Housing in the Last Year: No    Number of Times Moved in the Last Year: 0    Homeless in the Last Year: No  Utilities: Not At Risk (03/15/2024)   Epic    Threatened with loss of utilities: No  Health Literacy: Adequate Health Literacy (03/15/2024)   B1300 Health Literacy    Frequency of need for help with medical instructions: Never     Family History: The patient's family history includes Alcoholism in his son; Asthma in his  nephew; Breast cancer in his sister; Depression in his son; Diverticulitis in his sister and sister; Heart disease in his father; Melanoma in his brother; Osteoporosis in his sister; Other in his mother; Pneumonia in his nephew; Thyroid  disease in his sister. There is no history of Colon cancer, Esophageal cancer, Stomach cancer, or Pancreatic cancer.  ROS:   Please see the history of present illness.     All other systems reviewed and are negative.  EKGs/Labs/Other Studies Reviewed:    The following studies were reviewed today:   EKG:   02/23/2023: Sinus rhythm with ventricular bigeminy, rate 73, QTc 430 04/28/23: Sinus bradycardia, rate 54, QTc 400 06/23/2023: Sinus bradycardia, rate 54, right bundle branch block, QTc 421 06/20/2024: Sinus rhythm with PVC, rate 60, right bundle branch block, QTc 444  Recent Labs: No results found for requested labs within last 365 days.  Recent Lipid Panel    Component Value Date/Time   CHOL 119 06/23/2023 1437   TRIG 172 (H) 06/23/2023 1437   HDL 38 (L) 06/23/2023 1437   CHOLHDL 3.1 06/23/2023 1437   CHOLHDL 4.2 02/24/2023 0807   VLDL 62 (H) 09/11/2009 0137   LDLCALC 52 06/23/2023 1437   LDLCALC 99 02/24/2023 0807    Physical Exam:    VS:  BP 134/66   Pulse 60   Ht 5' 9 (1.753 m)   Wt 181 lb 9.6 oz (82.4 kg)   SpO2 94%   BMI 26.82 kg/m     Wt Readings from Last 3 Encounters:  06/20/24 181 lb 9.6 oz (82.4 kg)  05/21/24 182 lb 6.4 oz (82.7 kg)  03/22/24 178 lb (80.7 kg)     GEN:  Well nourished, well developed in no acute distress HEENT: Normal  NECK: No JVD; No carotid bruits LYMPHATICS: No lymphadenopathy CARDIAC: Irregular RESPIRATORY:  Clear to auscultation without rales, wheezing or rhonchi  ABDOMEN: Soft, non-tender, non-distended MUSCULOSKELETAL:  No edema; No deformity  SKIN: Warm and dry NEUROLOGIC:  Alert and oriented x 3 PSYCHIATRIC:  Normal affect   ASSESSMENT:    1. Coronary artery disease involving native  coronary artery of native heart, unspecified whether angina present   2. Frequent PVCs   3. Hyperlipidemia, unspecified hyperlipidemia type   4. Aortic valve insufficiency, etiology of cardiac valve disease unspecified   5. Hematochezia   6. Essential hypertension       PLAN:    CAD: CT chest 02/2023 showed severe coronary calcifications.  He is not reporting anginal symptoms but activity has been limited due to knee pain.  Stress PET on 05/18/2023 showed mild reversible basal anterior/anterolateral perfusion defect consistent with ischemia; myocardial blood flow reserve was overall normal but decreased (1.56) in area of perfusion defect, EF 37% and decreased to 31% with stress (question accuracy of EF due to frequent PVCs that can affect gating), severe coronary calcifications; overall intermediate risk study.  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  LHC 06/17/2023 showed minimal CAD. -Continue aspirin , statin  Frequent PVCs: Noted to be in ventricular bigeminy on recent EKG 02/2023.  Zio patch x 4 days 04/2023 showed 1 episode of NSVT lasting 5 beats, 14 episodes of SVT with longest lasting 25 seconds with average rate 136 bpm, frequent PVCs (9.2% of beats). -Continue Toprol -XL 25 mg daily - Repeat Zio patch x 3 days to monitor for improvement in PVC burden since starting metoprolol   Hyperlipidemia: On atorvastatin  20 mg daily, LDL 52 on 06/23/2023.  Aortic regurgitation: Moderate on echocardiogram 05/2023.  Will monitor, recommend repeat echocardiogram  Hematochezia: Reports had some blood on toilet paper yesterday.  Check CBC.  Would hold aspirin  x 3 days.  Discussed that if having any further bleeding would recommend contacting his gastroenterologist   RTC in 6 months     Medication Adjustments/Labs and Tests Ordered: Current medicines are reviewed at length with the patient today.  Concerns  regarding medicines are outlined above.  Orders Placed This Encounter  Procedures   Basic Metabolic Panel (BMET)   Lipid panel   CBC   LONG TERM MONITOR (3-14 DAYS)   EKG 12-Lead   ECHOCARDIOGRAM COMPLETE   No orders of the defined types were placed in this encounter.   Patient Instructions  Medication Instructions:  Please hold your Asprin as instruction by your provider for next 3 days *If you need a refill on your cardiac medications before your next appointment, please call your pharmacy*  Lab Work: Bmet, lipid, cbc today If you have labs (blood work) drawn today and your tests are completely normal, you will receive your results only by: MyChart Message (if you have MyChart) OR A paper copy in the mail If you have any lab test that is abnormal or we need to change your treatment, we will call you to review the results.  Testing/Procedures: Echo  Your physician has requested that you have an echocardiogram. Echocardiography is a painless test that uses sound waves to create images of your heart. It provides your doctor with information about the size and shape of your heart and how well your hearts chambers and valves are working. This procedure takes approximately one hour. There are no restrictions for this procedure. Please  do NOT wear cologne, perfume, aftershave, or lotions (deodorant is allowed). Please arrive 15 minutes prior to your appointment time.  Please note: We ask at that you not bring children with you during ultrasound (echo/ vascular) testing. Due to room size and safety concerns, children are not allowed in the ultrasound rooms during exams. Our front office staff cannot provide observation of children in our lobby area while testing is being conducted. An adult accompanying a patient to their appointment will only be allowed in the ultrasound room at the discretion of the ultrasound technician under special circumstances. We apologize for any inconvenience.    Follow-Up: At [facility], you and your health needs are our priority.  As part of our continuing mission to provide you with exceptional heart care, our providers are all part of one team.  This team includes your primary Cardiologist (physician) and Advanced Practice Providers or APPs (Physician Assistants and Nurse Practitioners) who all work together to provide you with the care you need, when you need it.  Your next appointment:   6 months   Provider:   Dr. KATE  We recommend signing up for the patient portal called MyChart.  Sign up information is provided on this After Visit Summary.  MyChart is used to connect with patients for Virtual Visits (Telemedicine).  Patients are able to view lab/test results, encounter notes, upcoming appointments, etc.  Non-urgent messages can be sent to your provider as well.   To learn more about what you can do with MyChart, go to [email or web address].   Other Instructions Please follow up with your GI provider if bleeding continues   ZIO XT- Long Term Monitor Instructions   Your physician has requested you wear your ZIO patch monitor____3___days.   This is a single patch monitor.  [employer] supplies one patch monitor per enrollment.  Additional stickers are not available.   Please do not apply patch if you will be having a Nuclear Stress Test, Echocardiogram, Cardiac CT, MRI, or Chest Xray during the time frame you would be wearing the monitor. The patch cannot be worn during these tests.  You cannot remove and re-apply the ZIO XT patch monitor.   Your ZIO patch monitor will be sent USPS Priority mail from [facility] directly to your home address. The monitor may also be mailed to a PO BOX if home delivery is not available.   It may take 3-5 days to receive your monitor after you have been enrolled.   Once you have received you monitor, please review enclosed instructions.  Your monitor has already been registered  assigning a specific monitor serial # to you.   Applying the monitor   Shave hair from upper left chest.   Hold abrader disc by orange tab.  Rub abrader in 40 strokes over left upper chest as indicated in your monitor instructions.   Clean area with 4 enclosed alcohol pads .  Use all pads to assure are is cleaned thoroughly.  Let dry.   Apply patch as indicated in monitor instructions.  Patch will be place under collarbone on left side of chest with arrow pointing upward.   Rub patch adhesive wings for 2 minutes.Remove white label marked 1.  Remove white label marked 2.  Rub patch adhesive wings for 2 additional minutes.   While looking in a mirror, press and release button in center of patch.  A small green light will flash 3-4 times .  This will be your only indicator the monitor  has been turned on.     Do not shower for the first 24 hours.  You may shower after the first 24 hours.   Press button if you feel a symptom. You will hear a small click.  Record Date, Time and Symptom in the Patient Log Book.   When you are ready to remove patch, follow instructions on last 2 pages of Patient Log Book.  Stick patch monitor onto last page of Patient Log Book.   Place Patient Log Book in [place] box.  Use locking tab on box and tape box closed securely.  The [employer] has [employer] on it.  Please place in mailbox as soon as possible.  Your physician should have your test results approximately 7 days after the monitor has been mailed back to [facility].   Call [facility] Customer Care at [phone] if you have questions regarding your ZIO XT patch monitor.  Call them immediately if you see an orange light blinking on your monitor.   If your monitor falls off in less than 4 days contact our Monitor department at [phone].  If your monitor becomes loose or falls off after 4 days call [employer] at [phone] for suggestions on securing your monitor.              Signed, Lonni LITTIE Nanas, MD  06/20/2024 10:49 AM    [facility] "

## 2024-06-20 ENCOUNTER — Ambulatory Visit: Attending: Cardiology | Admitting: Cardiology

## 2024-06-20 ENCOUNTER — Encounter: Payer: Self-pay | Admitting: Cardiology

## 2024-06-20 ENCOUNTER — Ambulatory Visit

## 2024-06-20 VITALS — BP 134/66 | HR 60 | Ht 69.0 in | Wt 181.6 lb

## 2024-06-20 DIAGNOSIS — E785 Hyperlipidemia, unspecified: Secondary | ICD-10-CM | POA: Insufficient documentation

## 2024-06-20 DIAGNOSIS — K921 Melena: Secondary | ICD-10-CM | POA: Diagnosis present

## 2024-06-20 DIAGNOSIS — I493 Ventricular premature depolarization: Secondary | ICD-10-CM

## 2024-06-20 DIAGNOSIS — I2510 Atherosclerotic heart disease of native coronary artery without angina pectoris: Secondary | ICD-10-CM | POA: Diagnosis not present

## 2024-06-20 DIAGNOSIS — I351 Nonrheumatic aortic (valve) insufficiency: Secondary | ICD-10-CM | POA: Insufficient documentation

## 2024-06-20 DIAGNOSIS — I10 Essential (primary) hypertension: Secondary | ICD-10-CM | POA: Diagnosis present

## 2024-06-20 LAB — LIPID PANEL
Chol/HDL Ratio: 2.9 ratio (ref 0.0–5.0)
Cholesterol, Total: 123 mg/dL (ref 100–199)
HDL: 42 mg/dL
LDL Chol Calc (NIH): 56 mg/dL (ref 0–99)
Triglycerides: 143 mg/dL (ref 0–149)
VLDL Cholesterol Cal: 25 mg/dL (ref 5–40)

## 2024-06-20 LAB — BASIC METABOLIC PANEL WITH GFR
BUN/Creatinine Ratio: 22 (ref 10–24)
BUN: 20 mg/dL (ref 8–27)
CO2: 26 mmol/L (ref 20–29)
Calcium: 9.6 mg/dL (ref 8.6–10.2)
Chloride: 101 mmol/L (ref 96–106)
Creatinine, Ser: 0.91 mg/dL (ref 0.76–1.27)
Glucose: 115 mg/dL — ABNORMAL HIGH (ref 70–99)
Potassium: 4.5 mmol/L (ref 3.5–5.2)
Sodium: 139 mmol/L (ref 134–144)
eGFR: 85 mL/min/1.73

## 2024-06-20 LAB — CBC
Hematocrit: 46.4 % (ref 37.5–51.0)
Hemoglobin: 15.1 g/dL (ref 13.0–17.7)
MCH: 29.4 pg (ref 26.6–33.0)
MCHC: 32.5 g/dL (ref 31.5–35.7)
MCV: 90 fL (ref 79–97)
Platelets: 224 x10E3/uL (ref 150–450)
RBC: 5.13 x10E6/uL (ref 4.14–5.80)
RDW: 12.7 % (ref 11.6–15.4)
WBC: 4.9 x10E3/uL (ref 3.4–10.8)

## 2024-06-20 NOTE — Progress Notes (Unsigned)
 Enrolled for [employer] to mail a ZIO XT long term holter monitor to the patients address on file.

## 2024-06-20 NOTE — Patient Instructions (Addendum)
 Medication Instructions:  Please hold your Asprin as instruction by your provider for next 3 days *If you need a refill on your cardiac medications before your next appointment, please call your pharmacy*  Lab Work: Bmet, lipid, cbc today If you have labs (blood work) drawn today and your tests are completely normal, you will receive your results only by: MyChart Message (if you have MyChart) OR A paper copy in the mail If you have any lab test that is abnormal or we need to change your treatment, we will call you to review the results.  Testing/Procedures: Echo  Your physician has requested that you have an echocardiogram. Echocardiography is a painless test that uses sound waves to create images of your heart. It provides your doctor with information about the size and shape of your heart and how well your hearts chambers and valves are working. This procedure takes approximately one hour. There are no restrictions for this procedure. Please do NOT wear cologne, perfume, aftershave, or lotions (deodorant is allowed). Please arrive 15 minutes prior to your appointment time.  Please note: We ask at that you not bring children with you during ultrasound (echo/ vascular) testing. Due to room size and safety concerns, children are not allowed in the ultrasound rooms during exams. Our front office staff cannot provide observation of children in our lobby area while testing is being conducted. An adult accompanying a patient to their appointment will only be allowed in the ultrasound room at the discretion of the ultrasound technician under special circumstances. We apologize for any inconvenience.   Follow-Up: At [facility], you and your health needs are our priority.  As part of our continuing mission to provide you with exceptional heart care, our providers are all part of one team.  This team includes your primary Cardiologist (physician) and Advanced Practice Providers or APPs  (Physician Assistants and Nurse Practitioners) who all work together to provide you with the care you need, when you need it.  Your next appointment:   6 months   Provider:   Dr. KATE  We recommend signing up for the patient portal called MyChart.  Sign up information is provided on this After Visit Summary.  MyChart is used to connect with patients for Virtual Visits (Telemedicine).  Patients are able to view lab/test results, encounter notes, upcoming appointments, etc.  Non-urgent messages can be sent to your provider as well.   To learn more about what you can do with MyChart, go to [email or web address].   Other Instructions Please follow up with your GI provider if bleeding continues   ZIO XT- Long Term Monitor Instructions   Your physician has requested you wear your ZIO patch monitor____3___days.   This is a single patch monitor.  [employer] supplies one patch monitor per enrollment.  Additional stickers are not available.   Please do not apply patch if you will be having a Nuclear Stress Test, Echocardiogram, Cardiac CT, MRI, or Chest Xray during the time frame you would be wearing the monitor. The patch cannot be worn during these tests.  You cannot remove and re-apply the ZIO XT patch monitor.   Your ZIO patch monitor will be sent USPS Priority mail from [facility] directly to your home address. The monitor may also be mailed to a PO BOX if home delivery is not available.   It may take 3-5 days to receive your monitor after you have been enrolled.   Once you have received you monitor, please review enclosed  instructions.  Your monitor has already been registered assigning a specific monitor serial # to you.   Applying the monitor   Shave hair from upper left chest.   Hold abrader disc by orange tab.  Rub abrader in 40 strokes over left upper chest as indicated in your monitor instructions.   Clean area with 4 enclosed alcohol pads .  Use all pads to assure  are is cleaned thoroughly.  Let dry.   Apply patch as indicated in monitor instructions.  Patch will be place under collarbone on left side of chest with arrow pointing upward.   Rub patch adhesive wings for 2 minutes.Remove white label marked 1.  Remove white label marked 2.  Rub patch adhesive wings for 2 additional minutes.   While looking in a mirror, press and release button in center of patch.  A small green light will flash 3-4 times .  This will be your only indicator the monitor has been turned on.     Do not shower for the first 24 hours.  You may shower after the first 24 hours.   Press button if you feel a symptom. You will hear a small click.  Record Date, Time and Symptom in the Patient Log Book.   When you are ready to remove patch, follow instructions on last 2 pages of Patient Log Book.  Stick patch monitor onto last page of Patient Log Book.   Place Patient Log Book in [place] box.  Use locking tab on box and tape box closed securely.  The [employer] has [employer] on it.  Please place in mailbox as soon as possible.  Your physician should have your test results approximately 7 days after the monitor has been mailed back to [facility].   Call [facility] Customer Care at [phone] if you have questions regarding your ZIO XT patch monitor.  Call them immediately if you see an orange light blinking on your monitor.   If your monitor falls off in less than 4 days contact our Monitor department at [phone].  If your monitor becomes loose or falls off after 4 days call [employer] at [phone] for suggestions on securing your monitor.

## 2024-06-21 ENCOUNTER — Ambulatory Visit: Payer: Self-pay | Admitting: Cardiology

## 2024-07-02 ENCOUNTER — Ambulatory Visit: Admitting: Cardiology

## 2024-07-10 DIAGNOSIS — I493 Ventricular premature depolarization: Secondary | ICD-10-CM

## 2024-07-19 ENCOUNTER — Ambulatory Visit (HOSPITAL_COMMUNITY)
Admission: RE | Admit: 2024-07-19 | Discharge: 2024-07-19 | Disposition: A | Source: Ambulatory Visit | Attending: Cardiology | Admitting: Cardiology

## 2024-07-19 DIAGNOSIS — I351 Nonrheumatic aortic (valve) insufficiency: Secondary | ICD-10-CM

## 2024-07-20 ENCOUNTER — Other Ambulatory Visit: Payer: Self-pay | Admitting: Cardiology

## 2024-08-23 ENCOUNTER — Ambulatory Visit: Admitting: Cardiology

## 2024-12-25 ENCOUNTER — Ambulatory Visit: Admitting: Internal Medicine

## 2025-03-21 ENCOUNTER — Ambulatory Visit: Payer: Self-pay | Admitting: Orthopedic Surgery
